# Patient Record
Sex: Male | Born: 1945 | Race: White | Hispanic: No
Health system: Midwestern US, Community
[De-identification: ages and names within clinical notes are randomized; demographics above are authoritative.]

## PROBLEM LIST (undated history)

## (undated) DIAGNOSIS — M545 Low back pain, unspecified: Secondary | ICD-10-CM

## (undated) DIAGNOSIS — I4819 Other persistent atrial fibrillation: Secondary | ICD-10-CM

## (undated) DIAGNOSIS — K219 Gastro-esophageal reflux disease without esophagitis: Secondary | ICD-10-CM

## (undated) DIAGNOSIS — M1990 Unspecified osteoarthritis, unspecified site: Secondary | ICD-10-CM

## (undated) DIAGNOSIS — G4733 Obstructive sleep apnea (adult) (pediatric): Secondary | ICD-10-CM

## (undated) DIAGNOSIS — I719 Aortic aneurysm of unspecified site, without rupture: Secondary | ICD-10-CM

## (undated) DIAGNOSIS — IMO0001 Reserved for inherently not codable concepts without codable children: Secondary | ICD-10-CM

## (undated) DIAGNOSIS — Z9981 Dependence on supplemental oxygen: Secondary | ICD-10-CM

## (undated) DIAGNOSIS — I714 Abdominal aortic aneurysm, without rupture, unspecified: Secondary | ICD-10-CM

## (undated) DIAGNOSIS — I712 Thoracic aortic aneurysm, without rupture, unspecified: Secondary | ICD-10-CM

## (undated) DIAGNOSIS — Z9989 Dependence on other enabling machines and devices: Secondary | ICD-10-CM

## (undated) DIAGNOSIS — K56609 Unspecified intestinal obstruction, unspecified as to partial versus complete obstruction: Secondary | ICD-10-CM

## (undated) DIAGNOSIS — K5792 Diverticulitis of intestine, part unspecified, without perforation or abscess without bleeding: Secondary | ICD-10-CM

## (undated) DIAGNOSIS — E785 Hyperlipidemia, unspecified: Secondary | ICD-10-CM

## (undated) DIAGNOSIS — H9190 Unspecified hearing loss, unspecified ear: Secondary | ICD-10-CM

## (undated) DIAGNOSIS — G8929 Other chronic pain: Secondary | ICD-10-CM

## (undated) DIAGNOSIS — I10 Essential (primary) hypertension: Secondary | ICD-10-CM

## (undated) DIAGNOSIS — I4891 Unspecified atrial fibrillation: Secondary | ICD-10-CM

## (undated) HISTORY — PX: COLON SURGERY: SHX602

## (undated) HISTORY — DX: Thoracic aortic aneurysm, without rupture: I71.2

## (undated) HISTORY — DX: Abdominal aortic aneurysm, without rupture, unspecified: I71.40

## (undated) HISTORY — DX: Other persistent atrial fibrillation: I48.19

## (undated) HISTORY — DX: Hyperlipidemia, unspecified: E78.5

## (undated) HISTORY — DX: Thoracic aortic aneurysm, without rupture, unspecified: I71.20

## (undated) HISTORY — DX: Essential (primary) hypertension: I10

## (undated) HISTORY — DX: Abdominal aortic aneurysm, without rupture: I71.4

## (undated) HISTORY — DX: Unspecified atrial fibrillation: I48.91

## (undated) HISTORY — PX: ABLATION OF DYSRHYTHMIC FOCUS: SHX254

## (undated) NOTE — Telephone Encounter (Signed)
 Formatting of this note might be different from the original.  Patient called and left a message that the rapaflo has helped and would like a prescription sent to his pharmacy  Electronically signed by Artis Dorothyann HERO at 07/05/2015  1:44 PM EDT

---
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
## 1976-11-16 HISTORY — PX: INGUINAL HERNIA REPAIR: SUR1180

## 1977-11-16 HISTORY — PX: WISDOM TOOTH EXTRACTION: SHX21

## 1987-04-17 HISTORY — PX: NASAL SINUS SURGERY: SHX719

## 2013-04-11 LAB — HM COLONOSCOPY

## 2013-06-25 ENCOUNTER — Emergency Department: Payer: Self-pay | Admitting: Emergency Medicine

## 2013-06-25 LAB — CBC
HCT: 47.2 % (ref 40.0–52.0)
HGB: 16.4 g/dL (ref 13.0–18.0)
MCH: 31.5 pg (ref 26.0–34.0)
MCHC: 34.8 g/dL (ref 32.0–36.0)
Platelet: 160 10*3/uL (ref 150–440)
RBC: 5.22 10*6/uL (ref 4.40–5.90)

## 2013-06-25 LAB — COMPREHENSIVE METABOLIC PANEL
Albumin: 4 g/dL (ref 3.4–5.0)
Alkaline Phosphatase: 110 U/L (ref 50–136)
BUN: 10 mg/dL (ref 7–18)
Bilirubin,Total: 0.5 mg/dL (ref 0.2–1.0)
Chloride: 102 mmol/L (ref 98–107)
Glucose: 145 mg/dL — ABNORMAL HIGH (ref 65–99)
Osmolality: 279 (ref 275–301)
Potassium: 3.2 mmol/L — ABNORMAL LOW (ref 3.5–5.1)
SGOT(AST): 18 U/L (ref 15–37)
SGPT (ALT): 26 U/L (ref 12–78)
Sodium: 139 mmol/L (ref 136–145)

## 2013-06-25 LAB — TROPONIN I
Troponin-I: <0.02 ng/mL
Troponin-I: <0.02 ng/mL

## 2013-06-25 LAB — CK TOTAL AND CKMB (NOT AT ARMC): CK-MB: 1.2 ng/mL (ref 0.5–3.6)

## 2013-06-25 LAB — LIPASE, BLOOD: Lipase: 267 U/L (ref 73–393)

## 2013-09-19 NOTE — Procedures (Unsigned)
Patient Name: Mark Jacobs  Account Number: [id number]  Date of Birth:  [birth year]  Record Number:  [id number]  Date of Procedure: 09/19/2013  Referring Physician(s): Delos Haring    Endoscopist: Dalia Heading     PROCEDURE PERFORMED         Colonoscopy  INDICATIONS FOR EXAMINATION:    constipation, abnormal stool  Instruments:  CF-H180AL  Medications: Fentanyl 75 mcg, Versed 3 mg Visualization: Good  Tolerance:  Good Complications:  None Extent of Exam: Rectosigmoid junction  Limitations: None    Specimens:   Classes: ASA Class II and Mallampati Class   II  Estimated Blood Loss:    Procedure Technique: The nature of the procedure including potential   complications such as bleeding, perforation, infection, missed lesions, risk   of cancer in the future and adverse reaction to medications, and alternatives   was discussed with the patient who appeared to understand and signed consent   was obtained.  The patient was connected to the monitoring devices and placed   in the left lateral position. After adequate IV sedation, a digital rectal   exam was performed.  The colonoscope was introduced into the rectum but only   advanced to the rectosigmoid junction.  The colon was then decompressed and   the scope was removed.  The patient tolerated the procedure well and there   were no apparent complications.  The rectal exam was normal.  The quality of   the prep was inadequate.    FINDINGS:  Poor prep in the rectum and beyond    ENDOSCOPIC DIAGNOSIS:  Incomplete colonoscopy  Poor prep    RECOMMENDATIONS:  Reschedule colonoscopy with 2 day prep        Signature:_________________________________ Dalia Heading , M.D.     This procedure was electronically signed off on(Endoscopy): 09/19/2013   11:16:42 AM by Dalia Heading,  M.D.

## 2013-09-29 LAB — CREATININE, POC
Creatinine: 1.4 mg/dl — ABNORMAL HIGH (ref 0.6–1.3)
Performed by: 96341

## 2013-09-30 LAB — CBC WITH AUTOMATED DIFF
BASOPHILS: 0.5 % (ref 0–3)
EOSINOPHILS: 0.8 % (ref 0–5)
HCT: 43.5 % (ref 37.0–50.0)
HGB: 14.8 gm/dl (ref 12.4–17.2)
IMMATURE GRANULOCYTES: 0.5 % (ref 0.0–3.0)
LYMPHOCYTES: 13.4 % — ABNORMAL LOW (ref 28–48)
MCH: 30.8 pg (ref 23.0–34.6)
MCHC: 34 gm/dl (ref 30.0–36.0)
MCV: 90.4 fL (ref 80.0–98.0)
MONOCYTES: 6.8 % (ref 1–13)
MPV: 11.4 fL — ABNORMAL HIGH (ref 6.0–10.0)
NEUTROPHILS: 78 % — ABNORMAL HIGH (ref 34–64)
NRBC: 0 (ref 0–0)
PLATELET: 272 10*3/uL (ref 140–450)
RBC: 4.81 M/uL (ref 3.80–5.70)
RDW-SD: 41.9 (ref 35.1–43.9)
WBC: 6.5 10*3/uL (ref 4.0–11.0)

## 2013-09-30 LAB — METABOLIC PANEL, BASIC
BUN: 17 mg/dl (ref 7–25)
CO2: 29 mEq/L (ref 21–32)
Calcium: 9 mg/dl (ref 8.5–10.1)
Chloride: 104 mEq/L (ref 98–107)
Creatinine: 1.3 mg/dl (ref 0.6–1.3)
GFR est AA: >60
GFR est non-AA: 59
Glucose: 86 mg/dl (ref 74–106)
Potassium: 4 mEq/L (ref 3.5–5.1)
Sodium: 138 mEq/L (ref 136–145)

## 2013-09-30 LAB — PROTHROMBIN TIME + INR
INR: 1.1 (ref 0.0–1.1)
Prothrombin time: 13.9 seconds (ref 11.5–14.0)

## 2013-09-30 LAB — GLUCOSE, POC: Glucose (POC): 72 mg/dL (ref 65–105)

## 2013-09-30 NOTE — ED Provider Notes (Signed)
[facility]  EMERGENCY DEPARTMENT TREATMENT REPORT  NAME:  [name], [name]  SEX:   M  ADMIT: 09/30/2013  DOB:   1946-06-06  MR#    [id number]  ROOM:  5223  TIME DICTATED: 03 54 PM  ACCT#  [id number]        I hereby certify this patient for admission based upon medical necessity as   noted below:    DATE OF SERVICE:   09/30/2013    TIME OF SERVICE:   At approximately 0220 P.M.    CHIEF COMPLAINT:  Abnormal outpatient CT.    HISTORY OF PRESENT ILLNESS:  The patient is a 67 year old gentleman who has had abdominal pain for about 2   weeks now.  The patient had a period in which the pain got better and has been   on outpatient antibiotics for diverticulitis but states his pain got abruptly   worse and was CT'd as an outpatient by Dr. Dorothyann Gibbs.  Dr. Dorothyann Gibbs to call   earlier today and let us know that he has abnormal CT results.  It would   appear that he has an abscess formation.    REVIEW OF SYSTEMS:  CONSTITUTIONAL:  Positive for fever, chills and sweats at night.  EYES:   No visual symptoms.   ENT:  No sore throat, runny nose, or other URI symptoms.   ENDOCRINE:  No diabetic symptoms.   HEMATOLOGIC/LYMPHATIC:   No excessive bruising or lymph node swelling.   ALLERGIC/IMMUNOLOGIC:  No urticaria or allergy symptoms.   RESPIRATORY:  No cough, shortness of breath, or wheezing.   CARDIOVASCULAR:  No chest pain, chest pressure, or palpitations.   GASTROINTESTINAL:  Positive for abdominal pain and nausea without vomiting.    The patient has had abnormal stools for 2 months.  GENITOURINARY:  No dysuria, frequency, or urgency.   MUSCULOSKELETAL:  No joint pain or swelling.   INTEGUMENTARY:  No rashes.   NEUROLOGICAL:  No headaches, sensory or motor symptoms.   PSYCHIATRIC:  No suicidal or homicidal ideation.     PAST MEDICAL HISTORY:  Negative.    PAST SURGICAL HISTORY:  Is also negative.    SOCIAL HISTORY:  Negative.  The patient does not smoke, does not drink and does not consume   recreational drugs.     PSYCHIATRIC HISTORY:  Denied.    FAMILY HISTORY:  Negative.    PHYSICAL EXAMINATION:  VITAL SIGNS:  On my assessment, blood pressure 130/108, heart rate 74,   respiratory 16, temperature 98, pain is 8 out of 10 and located diffusely over   the abdomen, O2 saturation 100% on room air.  GENERAL:  The patient appears uncomfortable, but is otherwise well developed   and adequately nourished.  HEENT:  Eye exam:  Conjunctivae are clear, lids are normal.  Pupils are equal   and normally reactive.  ENT:  Hearing is intact.  External examination of ears   and nose unremarkable.  Surfaces of the pharynx and palate are pink and free   of lesion.  NECK:  Supple.  There is no JVD.  Trachea is midline.  RESPIRATORY:  Clear and equal breath sounds.  No respiratory distress,   tachypnea, or accessory muscle use.   CARDIOVASCULAR:   Heart regular, without murmurs, gallops, rubs, or thrills.    The patient has no peripheral edema.  Calves are soft and nontender.  CHEST:  Symmetrical without masses or tenderness.  GASTROINTESTINAL:  Abdomen is distended and soft, but is tender diffusely.  The patient has a significant amount of pain in the right lower quadrant.  No   hepato- or splenomegaly could not be appreciated.  No abdominal or inguinal   masses are noted.  GENITOURINARY:  Deferred.  MUSCULOSKELETAL:  Stance and gait are normal.  Clubbing and deformities not   noted.  Nailbeds are pink with adequate capillary refill.  SKIN:  Warm and dry without rashes.   NEUROLOGIC:  Alert, oriented.  Sensation intact, motor strength equal and   symmetric.  There is no facial asymmetry or dysarthria.   PSYCHIATRIC:  Oriented to time, place and person.  Mood and affect    appropriate.     INITIAL ASSESSMENT AND MANAGEMENT PLAN:  The patient is a 67 year old gentleman who is sent in by Dr. Dorothyann Gibbs for an   abnormal CT scan.  We will call interventional radiology to get him in line    for CT-guided drainage if possible and will discuss with surgery per Dr.   Dorothyann Gibbs' request.    DIAGNOSTIC STUDIES:  The patient's CBC is normal.  The patient's INR is 1.1.  Basic metabolic panel   is normal.  Two-view chest x-ray is negative.  EKG shows a normal sinus   rhythm with a rate of 69.  He has no evidence of ischemia or infarction on   this EKG.    COURSE IN THE EMERGENCY DEPARTMENT:  The patient given Zosyn 3.375 grams IV med infusion after discussion with Dr.   Derrell Lolling from colorectal surgery.  The patient will be hydrated with normal   saline at 100 mL an hour.  The patient care was discussed with Dr. Maryelizabeth Kaufmann from   [facility] whose group who is happy to place the patient in an   inpatient regular bed for continued hydration, IV antibiotics and pending   evaluation for possible CT-guided drainage and consultation by colorectal   surgery.  There is a chance that CT-guided drainage cannot be performed and   therefore colorectal surgery is on board as well.    CLINICAL IMPRESSION AND DIAGNOSES:  1.  Complicated diverticulitis.  2.  Intraabdominal abscess.    DISPOSITION AND PLAN:  Inpatient, regular bed, currently stable.      ___________________  Mark Handy Himmel-Rebekka Lobello DO  Dictated By: Marland Kitchen     My signature above authenticates this document and my orders, the final  diagnosis (es), discharge prescription (s), and instructions in the [facility]   Pulsecheck record.  Nursing notes have been reviewed by the physician/mid-level provider.    If you have any questions please contact [phone].    LB  D:09/30/2013 15:54:18  T: 09/30/2013 16:55:25  [id number]  Authenticated by Mark Guernsey, DO On 10/23/2013 12:55:04 PM

## 2013-10-01 LAB — CBC WITH AUTOMATED DIFF
BASOPHILS: 0.5 % (ref 0–3)
EOSINOPHILS: 1.8 % (ref 0–5)
HCT: 42.3 % (ref 37.0–50.0)
HGB: 14.2 gm/dl (ref 12.4–17.2)
IMMATURE GRANULOCYTES: 0.5 % (ref 0.0–3.0)
LYMPHOCYTES: 18.8 % — ABNORMAL LOW (ref 28–48)
MCH: 30.5 pg (ref 23.0–34.6)
MCHC: 33.6 gm/dl (ref 30.0–36.0)
MCV: 90.8 fL (ref 80.0–98.0)
MONOCYTES: 8.7 % (ref 1–13)
MPV: 11.6 fL — ABNORMAL HIGH (ref 6.0–10.0)
NEUTROPHILS: 69.7 % — ABNORMAL HIGH (ref 34–64)
NRBC: 0 (ref 0–0)
PLATELET: 275 10*3/uL (ref 140–450)
RBC: 4.66 M/uL (ref 3.80–5.70)
RDW-SD: 41.7 (ref 35.1–43.9)
WBC: 6 10*3/uL (ref 4.0–11.0)

## 2013-10-01 LAB — METABOLIC PANEL, COMPREHENSIVE
ALT (SGPT): 21 U/L (ref 12–78)
AST (SGOT): 12 U/L — ABNORMAL LOW (ref 15–37)
Albumin: 2.9 gm/dl — ABNORMAL LOW (ref 3.4–5.0)
Alk. phosphatase: 68 U/L (ref 50–136)
BUN: 13 mg/dl (ref 7–25)
Bilirubin, total: 0.2 mg/dl (ref 0.2–1.0)
CO2: 28 mEq/L (ref 21–32)
Calcium: 8.4 mg/dl — ABNORMAL LOW (ref 8.5–10.1)
Chloride: 105 mEq/L (ref 98–107)
Creatinine: 1.2 mg/dl (ref 0.6–1.3)
GFR est AA: >60
GFR est non-AA: >60
Glucose: 102 mg/dl (ref 74–106)
Potassium: 4.2 mEq/L (ref 3.5–5.1)
Protein, total: 6.7 gm/dl (ref 6.4–8.2)
Sodium: 141 mEq/L (ref 136–145)

## 2013-10-01 NOTE — H&P (Signed)
[facility]  History and Physical  NAME:  Mark Jacobs, Mark Jacobs  SEX:   M  ADMIT: 09/30/2013  DOB:Aug 24, 1946  MR#    [id number]  ROOM:  5223  ACCT#  [id number]    I hereby certify this patient for admission based upon medical necessity as   noted below:    <    TIME OF SERVICE:  1959    CHIEF COMPLAINT:  Abdominal pain and constipation and also CT finding of diverticulitis with a   large air fluid collection measuring 7.5 x 6.9 x 7.8 cm thought to be a   diverticular abscess.    HISTORY OF PRESENT ILLNESS:  The patient is a 67 year old man with a past medical history significant for   diverticulosis, hypertension and diverticulitis in 2012, who presents to   [facility] with issues with chronic constipation, some   mid right lower abdominal pain and a CT of the abdomen that revealed a 7.5 x   6.9 x 7.8 cm air fluid collection surrounded by inflammatory soft tissue   stranding.  Because of this, he was sent to the ER for further evaluation and   treatment.  The patient has been having problems with chronic constipation for   3 years which has been getting worse.  He said he can hardly eat.  He uses   MiraLax on a daily basis at least twice a day.  The patient notes that he had   an unsuccessful colonoscopy that was attempted back on 11/04 with Dr.   Dorothyann Gibbs.  He said he took his first 16 ounces of a laxative solution which   went down fine.  He took a second 16 ounce dose to help with the colon prep   and he almost got the whole thing down when he then vomited up the last 16   ounces.  He had an immense pain located around the lower mid abdomen.  He then   presented for his colonoscopy.  He said that it could not be done because of   poor prep.  At that point, Dr. Dorothyann Gibbs later ordered a CT scan of the abdomen   and pelvis which showed the lesion as noted above.  The patient notes that   currently he feels okay except for some mild lower abdominal pain, denies any    chest pain or shortness of breath.  No nausea, vomiting, constipation,   diarrhea.  No lightheadedness or dizziness.  No fever and chills.  He denies   any leg edema.  No lightheadedness or dizziness.  No numbness or tingling.  No   heat or cold intolerance.  No night sweats.      REVIEW OF SYSTEMS:   Otherwise, the rest of the review of systems is found to be negative based on   12-step review of systems.    ALLERGIES:  NO KNOWN DRUG ALLERGIES.    PAST MEDICAL HISTORY:  Diverticulosis, hypertension, diverticulitis in 2012.    SOCIAL HISTORY:  The patient is widowed.  He has 2 kids, ages 61 and 57, a girl and a boy   respectively.  He drinks alcohol rarely, occasional wine.  No illicit drug   use.  Occasional caffeine use.    CURRENT MEDICATIONS:  MiraLax 3/4 cap b.i.d.    FAMILY HISTORY:  Mother is still alive at age 25.  She has history of colon cancer.  Father   died at age 78 of lung cancer.  He was  a smoker.     IMMUNIZATIONS:  He had a pneumonia shot in 2009.    PAST SURGICAL HISTORY:  Hernia repair, sinus surgery and colonoscopy in 2010.    VITAL SIGNS:    Height is 6 foot 2, blood pressure is 149/87, O2 saturation 99% on room air,   pulse 78, respiratory rate is 19, temperature 97.9 degrees Fahrenheit.  Weight   212 pounds.    PHYSICAL EXAMINATION:   GENERAL APPEARANCE:  The patient is alert and oriented times 3, no apparent   distress, very pleasant.  He is cooperative.  HEENT:  Sclerae is anicteric, conjunctivae pink.  Throat is clear.  Mucosa is   moist.  NECK:  No lymphadenopathy, no JVD, no bruits.  CHEST:  Clear to auscultation bilaterally.  No wheezes, no rales or rhonchi.  CARDIOVASCULAR:  Regular rate and rhythm.  S1, S2 is normal, no S3, S4 or   murmurs.  ABDOMEN:  Has some mild tenderness in the mid lower right abdomen upon deep   palpation.  No rebound or guarding.  No masses were noted.  No tenderness.  I   did not notice any tympany.  EXTREMITIES:  Had no edema, no clubbing or cyanosis.   NEUROLOGIC:  No focal neurologic deficits were noted.  Cranial nerves II   through XII were intact.    LABORATORY DATA:  BUN 17, creatinine 1.3, INR 1.1.  White count 6.5, hemoglobin 14.8, platelet   count 272, 78% neutrophils, 13% lymphocytes.  CT scan of the abdomen and   pelvis with contrast shows a large mid abdominal air fluid collection with   surrounding inflammatory soft tissue stranding measuring 75 x 69 x 78 mm with   interstitial air located close to sigmoid descending colon junction with   colonic diverticula most likely representing contained perforation of acute   diverticulitis with abscess collection and interstitial air of anaerobic air   producing organisms.  Scattered mesenteric reactive stranding was seen in the   pelvis and around this collection.  There is also a reactive small bowel ileus   some are adherent to this abscess.  Otherwise, the rest of the abdominal   organs and structures were normal and not remarkable.  A chest x-ray portable   was done that shows no acute disease.  EKG was also done that revealed normal   sinus rhythm with a rate of 69, no ischemic changes.    ASSESSMENT AND PLAN:  1.  Acute diverticulitis with abscess formation.  The patient is on Zosyn IV.    We will plan for interventional radiology to place a drain.  We will also   have Dr. Derrell Lolling of surgery on consult in case interventional radiology drain   is not able to be placed due to position of the abscess.  2.  Hypertension, off medications at this time.  Blood pressure is well   controlled.  3.  Chronic constipation, probably related to diverticulitis and abscess.  4.  Deep venous thrombosis prophylaxis.  Sequential compression devices.  I   will also have the patient ambulate.    Total time for admission was 50 minutes.      ___________________  Alvy Bimler MD  Dictated By: .   LH  D:10/01/2013 11:03:32  T: 10/01/2013 11:39:27  [id number]  Authenticated by Alvy Bimler, M.D. On 10/23/2013 05:53:24 PM

## 2013-10-01 NOTE — Consults (Signed)
[facility]  CONSULTATION REPORT  NAME:  [place], [name]  SEX:   M  ADMIT: 09/30/2013  DATE OF CONSULT: 10/01/2013  REFERRING PHYSICIAN:    DOB: 1946/04/08  MR#    [id number]  ROOM:  5223  ACCT#  [id number]    cc: [name] M.D., Dalia Heading MD    REASON FOR CONSULTATION:  Perforated diverticulitis with abscess.    REFERRING PHYSICIAN:  ER.    HISTORY OF PRESENT ILLNESS:  This is a very pleasant 67 year old male who is essentially otherwise healthy.   He presented to the emergency room with a several day history of worsening   constipation and mid abdominal pain.  Of note, he had an attempted colonoscopy   on 11/04 by Dr. Dorothyann Gibbs  and this was unsuccessful due to a very poor prep.    He ultimately ordered a CT scan of the abdomen and pelvis which showed a 7.5   x 6.9 x 7.8 cm air fluid collection adjacent to the sigmoid colon, descending   colon junction consistent with diverticulitis and abscess.  He was sent to the   ER for further evaluation and treatment ultimately admitted to the surgical   floor where I have seen him.  He states his constipation, had worsened despite   using MiraLax.  He did report worsening mid and lower abdominal pain.  Denies   fevers but was having sweats at home.  Denies nausea, vomiting.  Denied   rectal bleeding.  Reports he had 1 previous episode of diverticulitis.  I have   been asked to further evaluate and assist in his treatment plan.  Of note, he   denies any urinary symptoms.    PAST MEDICAL HISTORY:  Essentially negative.  He denies any history of hypertension, diabetes or   coronary artery disease.    PAST SURGICAL HISTORY:  Only significant for left inguinal hernia.    ALLERGIES:  NO KNOWN DRUG ALLERGIES.    FAMILY HISTORY:  Significant for a mother who has a history of colon cancer.    SOCIAL HISTORY:  Denies smoking use.  Rarely drinks alcohol.    REVIEW OF SYSTEMS:  His 10-point review of systems otherwise negative except as noted above.     PHYSICAL EXAMINATION:  GENERAL:  This is a pleasant, well-developed, well-nourished male in no acute   distress.  VITAL SIGNS:  Temperature is 97.7, pulse 61, respirations 18, blood pressure   158/91.  HEENT:  Shows head to be normocephalic, atraumatic.  Sclerae are anicteric.    Oropharynx is clear.  NECK:  Supple, without masses or adenopathy.  LUNGS:  Clear to auscultation bilaterally.  CARDIOVASCULAR:  Regular rate and rhythm.  ABDOMEN:  Soft, nondistended.  Minimal tenderness around a lower mid abdomen   drain.  There is no rebound or guarding.  EXTREMITIES:  No evidence of edema.  NEUROLOGIC:  Grossly intact.    LABORATORY STUDIES:  White count of 6, no of 14.4 and 42.3, platelet count is 275.  Electrolytes   are within normal limits.  LFTs are within normal limits.  Albumin is 2.9.  CT   scan which has been reviewed by myself shows the findings as noted in HPI.    IMPRESSION:  Complicated sigmoid diverticulitis with abscess, status post percutaneous   drainage.    PLAN:  Continue intravenous antibiotics.  Okay to start clear liquids.  Will follow   drain output.  Ultimately, he will need a repeat CT scan in approximately  7   days and also a fistulogram through the drain.  Once this has resolved I   strongly recommend that he proceed with a laparoscopic sigmoid colectomy.    Thank you very much for allowing me to see this very pleasant gentleman in   consultation.  We will follow along with you.      ___________________  Beryle Flock MD  Dictated By:.   FS  D:10/01/2013 12:04:58  T: 10/01/2013 12:32:14  [id number]  Authenticated by Beryle Flock, MD On 10/03/2013 09:29:04 AM

## 2013-10-02 LAB — CBC WITH AUTOMATED DIFF
BASOPHILS: 0.3 % (ref 0–3)
EOSINOPHILS: 0.9 % (ref 0–5)
HCT: 40.4 % (ref 37.0–50.0)
HGB: 13.8 gm/dl (ref 12.4–17.2)
IMMATURE GRANULOCYTES: 0.4 % (ref 0.0–3.0)
LYMPHOCYTES: 15 % — ABNORMAL LOW (ref 28–48)
MCH: 30.3 pg (ref 23.0–34.6)
MCHC: 34.2 gm/dl (ref 30.0–36.0)
MCV: 88.8 fL (ref 80.0–98.0)
MONOCYTES: 6.6 % (ref 1–13)
MPV: 11.4 fL — ABNORMAL HIGH (ref 6.0–10.0)
NEUTROPHILS: 76.8 % — ABNORMAL HIGH (ref 34–64)
NRBC: 0 (ref 0–0)
PLATELET: 296 10*3/uL (ref 140–450)
RBC: 4.55 M/uL (ref 3.80–5.70)
RDW-SD: 40.4 (ref 35.1–43.9)
WBC: 6.7 10*3/uL (ref 4.0–11.0)

## 2013-10-02 LAB — RENAL FUNCTION PANEL
Albumin: 2.9 gm/dl — ABNORMAL LOW (ref 3.4–5.0)
BUN: 8 mg/dl (ref 7–25)
CO2: 27 mEq/L (ref 21–32)
Calcium: 8.5 mg/dl (ref 8.5–10.1)
Chloride: 104 mEq/L (ref 98–107)
Creatinine: 1.1 mg/dl (ref 0.6–1.3)
GFR est AA: >60
GFR est non-AA: >60
Glucose: 120 mg/dl — ABNORMAL HIGH (ref 74–106)
Phosphorus: 3 mg/dl (ref 2.5–4.9)
Potassium: 3.4 mEq/L — ABNORMAL LOW (ref 3.5–5.1)
Sodium: 140 mEq/L (ref 136–145)

## 2013-10-03 LAB — METABOLIC PANEL, BASIC
BUN: 10 mg/dl (ref 7–25)
CO2: 26 mEq/L (ref 21–32)
Calcium: 8.5 mg/dl (ref 8.5–10.1)
Chloride: 105 mEq/L (ref 98–107)
Creatinine: 1.3 mg/dl (ref 0.6–1.3)
GFR est AA: >60
GFR est non-AA: 59
Glucose: 86 mg/dl (ref 74–106)
Potassium: 3.7 mEq/L (ref 3.5–5.1)
Sodium: 139 mEq/L (ref 136–145)

## 2013-10-03 LAB — CBC WITH AUTOMATED DIFF
BASOPHILS: 0.6 % (ref 0–3)
EOSINOPHILS: 1.1 % (ref 0–5)
HCT: 41.1 % (ref 37.0–50.0)
HGB: 14 gm/dl (ref 12.4–17.2)
IMMATURE GRANULOCYTES: 0.4 % (ref 0.0–3.0)
LYMPHOCYTES: 21 % — ABNORMAL LOW (ref 28–48)
MCH: 30.5 pg (ref 23.0–34.6)
MCHC: 34.1 gm/dl (ref 30.0–36.0)
MCV: 89.5 fL (ref 80.0–98.0)
MONOCYTES: 7.3 % (ref 1–13)
MPV: 11.1 fL — ABNORMAL HIGH (ref 6.0–10.0)
NEUTROPHILS: 69.6 % — ABNORMAL HIGH (ref 34–64)
NRBC: 0 (ref 0–0)
PLATELET: 302 10*3/uL (ref 140–450)
RBC: 4.59 M/uL (ref 3.80–5.70)
RDW-SD: 41.6 (ref 35.1–43.9)
WBC: 7 10*3/uL (ref 4.0–11.0)

## 2013-10-10 LAB — CREATININE, POC
Creatinine: 1.5 mg/dl — ABNORMAL HIGH (ref 0.6–1.3)
Performed by: 21212

## 2013-10-25 LAB — COLOGUARD: Cologuard: POSITIVE

## 2013-11-10 HISTORY — PX: COLECTOMY: SHX59

## 2013-11-16 NOTE — ED Provider Notes (Signed)
[facility] : [facility]  EMERGENCY DEPARTMENT TREATMENT REPORT  NAME:  [place], [name]  SEX:   M  ADMIT: 11/16/2013  DOB:   Oct 12, 1946  MR#    [id number]  ROOM:    TIME DICTATED: 02 00 PM  ACCT#  [id number]        TIME OF EVALUATION:   1359.    CHIEF COMPLAINT:  Cough.    HISTORY OF PRESENT ILLNESS:  This is a 68 year old male who states he has had a flu since 11/12/2013,   coughing, congested, low-grade fever.  He states he cough became chunky and   sometimes is pain obtained which is what concerned him.  He states he had to   reschedule surgery for an abscess in his colon and he was concerned when the   cough changed.  He wanted to make sure he was okay as he had to reschedule for   surgery that had to reschedule his surgery.  He has not traveled anywhere   recently.  He did have an influenza vaccine this season.    REVIEW OF SYSTEMS:  CONSTITUTIONAL:  Positive for low-grade fever and chills.  HEENT:  ENT:  No sore throat, runny nose, or other URI symptoms.   RESPIRATORY:  Positive for cough productive of some purulent sputum, sometimes   pink tinged.  No frank hemoptysis.  CARDIOVASCULAR:  No chest pain, chest pressure, or palpitations.   GASTROINTESTINAL:  Abdomen soft, nontender, without complaint of pain to   palpation.  No hepatomegaly or splenomegaly.   MUSCULOSKELETAL:  Denies any leg pain or swelling.  INTEGUMENTARY:  No rash.  NEUROLOGICAL:  No headaches, sensory or motor symptoms.     Denies complaints in all other systems.     PAST MEDICAL HISTORY:  He has a diverticular abscess.    MEDICATIONS:  None.    ALLERGIES:  NONE.    SOCIAL HISTORY:  Negative for tobacco or alcohol.  No history of travel.  He is here by   himself.    PHYSICAL EXAMINATION:  GENERAL:  A well-developed male, nontoxic in appearance.  VITAL SIGNS:  Blood pressure was 150/90, pulse 74, respirations 20,   temperature 98.6, O2 sats 98% on room air.  HEENT:  Conjunctivae are clear.  Ears:  He has hearing aids in both ears.     Mouth:  Mucous membranes pink, throat is clear.  NECK:  Supple, nontender, symmetrical, no masses or JVD, trachea midline,   thyroid not enlarged, nodular, or tender.   LUNGS:  Clear to auscultation without any wheezing or rhonchi.  He is not   tachypneic or dyspneic with conversation.  HEART:  Has a regular rate and rhythm without murmur.  ABDOMEN:  Soft and nontender.  EXTREMITIES:  Symmetrical, warm and dry.  Calves soft, nontender.  SKIN:  Without rash.  NEUROLOGICAL:  He is awake, alert and oriented.     CONTINUED BY JULIA C. HUBBARD, PA:      IMPRESSION AND MANAGEMENT PLAN:     This is a 68 year old male with cough, congestion.  At this time a chest x-ray   will be obtained to make sure he does not have pneumonia.    DIAGNOSTIC STUDIES:  Chest x-ray showed no acute disease in the chest per radiology.    COURSE IN THE EMERGENCY DEPARTMENT:  Findings were discussed.  I discussed this is probably still part of a viral   picture and the influenza-like illness he had.  He is concerned about his  upcoming surgery.  At this time we will write for a Z-Pak, have him hold off   for a few days to see if the symptoms abate.  I will write for some Tussionex   to help with his cough, rest, push fluids.  Have him follow up with his doctor   next week if not improving and to certainly seek medical attention anytime   worsening or new concerns.    FINAL DIAGNOSIS:  Acute cough, recent influenza-like illness.    DISPOSITION AND PLAN:    He was discharged home in stable condition to follow up as above.  The patient   was examined by myself and Dr. Sudie BaileyJohn Ajee Heasley who agrees with the above   assessment and plan.     CONTINUATION BY Candis ShineJONATHAN Troi Bechtold, MD     HISTORY OF PRESENT ILLNESS:  The patient is a 68 year old male here concerned that he has pneumonia.  Chest   x-ray is unremarkable.  He appears very well.      DECISIONMAKING:  As per Mikal PlaneJulia Hubbard, PA dictation.      ___________________  Wynelle BourgeoisJonathan A Jaylnn Ullery MD   Dictated By: Maurice SmallJulia C. Williams CheHubbard, [place]PA    My signature above authenticates this document and my orders, the final  diagnosis (es), discharge prescription (s), and instructions in the [facility]   Pulsecheck record.  Nursing notes have been reviewed by the physician/mid-level provider.    If you have any questions please contact [phone].    rm  D:11/16/2013 14:00:49  T: 11/16/2013 15:14:10  [id number]  Authenticated by Wynelle BourgeoisJONATHAN A Daxtin Leiker, MD On 11/22/2013 02:41:01 PM

## 2013-12-08 NOTE — Procedures (Unsigned)
Patient Name: Mark Jacobs  Account Number: [id number]  Date of Birth:  01/19/1946  Record Number:  [id number]  Date of Procedure: 12/08/2013  Referring Physician(s): Ray T Lenn Sinkamirez  Brandon M Peters    Endoscopist: Dalia HeadingPaul Ricketts     PROCEDURE PERFORMED         Colonoscopy  INDICATIONS FOR EXAMINATION:    change in bowel habits, follow up for   complicated diverticulitis  Instruments:  [id number]-180AL  Medications: Propofol per anesthesia department Visualization: Good  Tolerance:  Good Complications:  None Extent of Exam: Cecum  Limitations: None    Specimens:   Classes: ASA Class III and Mallampati Class   II  Estimated Blood Loss:    Procedure Technique: The nature of the procedure including potential   complications such as bleeding, perforation, infection, missed lesions, risk   of cancer in the future and adverse reaction to medications, and alternatives   was discussed with the patient who appeared to understand and signed consent   was obtained.  The patient was connected to the monitoring devices and placed   in the left lateral position. After adequate IV sedation, a digital rectal   exam was performed.  The colonoscope was introduced into the rectum and   advanced without difficulty through the colon to the Cecum. The cecum was   identified by typical landmarks including ileocecal valve and appendiceal   orifice.  A careful examination was performed as the scope was withdrawn. In   the rectum, the scope was retroflexed.  The colon was then decompressed and   the scope was removed.  The patient tolerated the procedure well and there   were no apparent complications.  The rectal exam was normal.  The quality of   the prep was good.    FINDINGS:  The scope was advanced the the cecum with some difficulty, primarily at the   end trying to intubate the cecum.  Cecal intubation was achieved with manual   external pressure and position change (prone).   Erythematous thickened ascending colon fold vs. flat polyp seen.  Biopsies   were taken to rule out adenomatous tissue.  Site was tattooed with [place] ink   1cc.  There was extensive sigmoid diverticulosis.  One diverticula had erythema and   some pus, consistent with diverticulitis.  This was seen only on scope   withdrawal.   The rest of the colonoscopy was unremarkable.  Internal hemorrhoids were seen on retroflexion in the rectum.    ENDOSCOPIC DIAGNOSIS:  Thickened ascending colon fold vs. polyp, biopsied and tattooed  Sigmoid diverticulitis, mild and likely chronic  Internal hemorrhoids    RECOMMENDATIONS:  Await pathology results.  Call office in 3 weeks if you have not been contacted with biopsy results.  Ciprofloxacin and flagyl x 7 days  Surgery (sigmoidectomy) per Dr. Derrell Lollingamirez next week  Clear liquid diet for now  Repeat colonoscopy in 6 months if biopsies are adenomatous        Signature:_________________________________ Dalia HeadingPaul Ricketts , M.D.     This procedure was electronically signed off on(Endoscopy): 12/08/2013 9:32:40   AM by Dalia HeadingPaul Ricketts,  M.D.

## 2013-12-11 NOTE — Op Note (Signed)
[facility]  [facility] Operation [facility]  NAME:  Mark GilbertJAMES, [name]  SEX:   M  DATE: 12/11/2013  DOB: 1946/06/20  MR#    [id number]  ROOM:  OR30  ACCT#  [id number]    cc: Mady HaagensenBRANDON PETERS M.D., Dalia HeadingPaul Ricketts MD, [name] Shaynna Husby MD    PREOPERATIVE DIAGNOSIS:  Recurrent diverticulitis was history of abscess.    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    PROCEDURE:  Laparoscopic low anterior resection, complete mobilization of splenic flexure,   omental pedicle flap to the pelvis, insertion of On-Q pain pump.    SURGEON:  Lennon Richins T.  Derrell Lollingamirez, MD    ASSISTANTS:  Dr. Jeral PinchBrian Buchberg and Ramiro HarvestEmily Duque, surgical assistant.    ANESTHESIA:  General endotracheal.    BLOOD LOSS:  100 mL    FLUIDS:  1500 mL crystalloid, 500 mL of 5% albumin.  Blood loss 100 mL.    URINE OUTPUT:  375 mL    COMPLICATIONS:  None.    FINDINGS:  Acute and chronic inflammation of the sigmoid colon and upper rectum with   adhesions to the small bowel and pelvis.    SPECIMENS:    Sigmoid colon and upper rectum.    INDICATIONS FOR PROCEDURE:  This is a [age] year old male who has previously had episodes of sigmoid   diverticulitis with abscess.  He did require percutaneous drainage.  He   underwent a colonoscopy on Friday and still showed some  mild acute   inflammation.  He is brought to the operating room today for elective   resection.    DETAILS OF PROCEDURE:  The patient was identified, brought to the operating was placed in supine   position on operating table.  Sequential compression devices placed on lower   extremities.  He was induced under endotracheal anesthesia.  Orogastric tube   and Foley catheter were placed subsequently placed in dorsal lithotomy   position.  Abdomen, perineum were prepped and draped in the usual sterile   fashion.  Area around the proposed incision was infiltrated with 0.5% Marcaine   with epinephrine.  A small 6 cm incision was made in the midline just above    the umbilicus.  This was carried down through subcutaneous tissues, fascia   divided in the midline.  Peritoneal cavity entered without difficulty.  The   Dextrus Hand access device placed in the wound.  Subsequently, a 5 mm trocar   was placed  in midline suprapubic position 10/12 trocar placed in the right   lower quadrant.  Pneumoperitoneum was established.  The visualized portion   upper abdomen appeared to be normal.  The patient had acute/chronic inflamed   sigmoid colon with adherence into the pelvis.  There were also multiple   adhesions to the right side of the sigmoid and sigmoid mesentery to the small   bowel.  Careful sharp dissection with the laparoscopic dissecting scissors   used to free up the terminal ileum from the inflamed process without   difficulty.  After completely freeing up the small bowel from the mesentery   and sigmoid colon, the right side of the sigmoid mesentery was scored.  The   avascular retroperitoneal space was entered. The left ureter was identified   and care was taken to avoid injury to the structure.  The inferior mesenteric   vein was isolated adjacent to the ligament of Treitz and divided with   laparoscopic vascular stapler.  In a similar fashion, the  inferior mesenteric   artery was isolated and divided then just proximal to the takeoff of the left   colic artery.  The sigmoid colon was carefully mobilized by sharp dissection.    There were some acute adhesions to the left pelvic sidewall.  The descending   colon was also mobilized to the midline.  The greater omentum was then taken   off the transverse colon, lesser sac entered and the splenic flexure   completely mobilized by sharp dissection.  Attention was then placed to the   pelvis.  The distal sigmoid upper rectum were adherent to the anterior pelvis   and right pelvic sidewall.  This was carefully taken down by sharp dissection.    Subsequently, the mid-rectum was noted to be soft.  The upper rectum was    chronically scarred. The mid-rectum was cleaned off nicely and divided with   laparoscopic GI stapler.  The avascular retrorectal space was entered.  The   mesorectum was then divided distally with the laparoscopic vascular stapler.    Pneumoperitoneum was carefully evacuated through trocar.  Upper rectum and   sigmoid were exteriorized through the Dextrus disk.  The remaining sigmoid   mesentery was serially clamped, divided and ligated with 2-0 Vicryl ties and   divided with the EnSeal device.  A colotomy was made in the proximal sigmoid   colon.  The anvil to the EEA 29 stapler with a SeamGuard was placed into the   descending colon.  Descending colon, sigmoid colon junction stapled close and   divided with a GIA stapler.  Specimen was passed off the field.  The anvil was   brought through the staple line on the antimesenteric side.  The descending   colon was placed back into the abdominal cavity.  Pneumoperitoneum was   reinsufflated.  Dr. Su Hoff went to the peritoneum and placed the EEA stapler   into the rectum and advanced to the rectal stump without difficulty. A   stapled end-to-end anastomosis was then performed from the descending colon to   the mid-rectum.  There was excellent length for tension free anastomosis.    Blood supply was good.  Anastomosis tested by placing a proctoscope in the   rectum, insufflating with air after placing warm sterile water in the pelvis,   there was no evidence of anastomotic leak.  Pelvis and left gutter were   copiously irrigated with sterile water.  Excellent hemostasis was obtained.    An omental pedicle flap was then fashioned.  The majority of the greater   omentum was taken off the distal transverse colon and the flap based on the   left gastroepiploic vessels was fashioned brought down the left gutter and   placed into the pelvis covering the anastomosis nicely.  The small bowel was   run from ligament Treitz proximally and noted to be completely intact.     Pneumoperitoneum was carefully evacuated through trocar.  On-Q pain catheters   were placed.  Needle and peel-away sheath placed several centimeters above and   lateral to the small midline incision.  Tunneling device was used to tunnel   the peel-away sheath into the preperitoneal space.  These were fed inferiorly   and laterally.  The fascia of the small midline incision was closed with   running #1 PDS.  Of note, all sponge, needle and instrument counts were   correct times 2 prior to closure.  Wound was pulse lavaged a total of 1 liter  bacitracin irrigation.  Skin incisions closed with running subcuticular 4-0   Monocryl.  Dermabond applied.  On-Q catheters was placed through the peel-away   sheaths, each loaded with 5 mL 0.5% Marcaine without epinephrine.  The   patient tolerated the procedure without any complications, transferred to the   recovery room extubated in stable condition.      ___________________  Beryle Flock MD  Dictated By:.   [facility]  D:12/11/2013 11:25:39  T: 12/11/2013 11:55:15  [id number]  Authenticated by Beryle Flock, MD On 12/11/2013 12:56:33 PM

## 2013-12-11 NOTE — Op Note (Signed)
[facility]  [facility]  NAME:  Severiano GilbertJAMES, Matyas  SEX:   M  DATE: 12/11/2013  DOB: [birth year]  MR#    [id number]  ROOM:  OR30  ACCT#  [id number]    cc: Mady HaagensenBRANDON PETERS M.D., Dalia HeadingPaul Ricketts MD, [name] Tigerlily Christine MD    PREOPERATIVE DIAGNOSIS:  Recurrent diverticulitis was history of abscess.    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    PROCEDURE:  Laparoscopic low anterior resection, complete mobilization of splenic flexure,   omental pedicle flap to the pelvis, insertion of On-Q pain pump.    SURGEON:  Maurissa Ambrose T.  Derrell Lollingamirez, MD    ASSISTANTS:  Dr. Jeral PinchBrian Buchberg and Ramiro HarvestEmily Duque, surgical assistant.    ANESTHESIA:  General endotracheal.    BLOOD LOSS:  100 mL    FLUIDS:  1500 mL crystalloid, 500 mL of 5% albumin.  Blood loss 100 mL.    URINE OUTPUT:  375 mL    COMPLICATIONS:  None.    FINDINGS:  Acute and chronic inflammation of the sigmoid colon and upper rectum with   adhesions to the small bowel and pelvis.    SPECIMENS:    Sigmoid colon and upper rectum.    INDICATIONS FOR PROCEDURE:  This is a 68 year old male who has previously had episodes of sigmoid   diverticulitis with abscess.  He did require percutaneous drainage.  He   underwent a colonoscopy on Friday and still showed some  mild acute   inflammation.  He is brought to the operating room today for elective   resection.    DETAILS OF PROCEDURE:  The patient was identified, brought to the operating was placed in supine   position on operating table.  Sequential compression devices placed on lower   extremities.  He was induced under endotracheal anesthesia.  Orogastric tube   and Foley catheter were placed subsequently placed in dorsal lithotomy   position.  Abdomen, perineum were prepped and draped in the usual sterile   fashion.  Area around the proposed incision was infiltrated with 0.5% Marcaine   with epinephrine.  A small 6 cm incision was made in the midline just above   the  umbilicus.  This was carried down through subcutaneous tissues, fascia   divided in the midline.  Peritoneal cavity entered without difficulty.  The   Dextrus Hand access device placed in the wound.  Subsequently, a 5 mm trocar   was placed  in midline suprapubic position 10/12 trocar placed in the right   lower quadrant.  Pneumoperitoneum was established.  The visualized portion   upper abdomen appeared to be normal.  The patient had acute/chronic inflamed   sigmoid colon with adherence into the pelvis.  There were also multiple   adhesions to the right side of the sigmoid and sigmoid mesentery to the small   bowel.  Careful sharp dissection with the laparoscopic dissecting scissors   used to free up the terminal ileum from the inflamed process without   difficulty.  After completely freeing up the small bowel from the mesentery   and sigmoid colon, the right side of the sigmoid mesentery was scored.  The   avascular retroperitoneal space was entered. The left ureter was identified   and care was taken to avoid injury to the structure.  The inferior mesenteric   vein was isolated adjacent to the ligament of Treitz and divided with   laparoscopic vascular stapler.  In a similar fashion, the  inferior mesenteric   artery was isolated and divided then just proximal to the takeoff of the left   colic artery.  The sigmoid colon was carefully mobilized by sharp dissection.    There were some acute adhesions to the left pelvic sidewall.  The descending   colon was also mobilized to the midline.  The greater omentum was then taken   off the transverse colon, lesser sac entered and the splenic flexure   completely mobilized by sharp dissection.  Attention was then placed to the   pelvis.  The distal sigmoid upper rectum were adherent to the anterior pelvis   and right pelvic sidewall.  This was carefully taken down by sharp dissection.    Subsequently, the mid-rectum was noted to be soft.  The upper rectum was   chronically  scarred. The mid-rectum was cleaned off nicely and divided with   laparoscopic GI stapler.  The avascular retrorectal space was entered.  The   mesorectum was then divided distally with the laparoscopic vascular stapler.    Pneumoperitoneum was carefully evacuated through trocar.  Upper rectum and   sigmoid were exteriorized through the Dextrus disk.  The remaining sigmoid   mesentery was serially clamped, divided and ligated with 2-0 Vicryl ties and   divided with the EnSeal device.  A colotomy was made in the proximal sigmoid   colon.  The anvil to the EEA 29 stapler with a SeamGuard was placed into the   descending colon.  Descending colon, sigmoid colon junction stapled close and   divided with a GIA stapler.  Specimen was passed off the field.  The anvil was   brought through the staple line on the antimesenteric side.  The descending   colon was placed back into the abdominal cavity.  Pneumoperitoneum was   reinsufflated.  Dr. Su Hoff went to the peritoneum and placed the EEA stapler   into the rectum and advanced to the rectal stump without difficulty. A   stapled end-to-end anastomosis was then performed from the descending colon to   the mid-rectum.  There was excellent length for tension free anastomosis.    Blood supply was good.  Anastomosis tested by placing a proctoscope in the   rectum, insufflating with air after placing warm sterile water in the pelvis,   there was no evidence of anastomotic leak.  Pelvis and left gutter were   copiously irrigated with sterile water.  Excellent hemostasis was obtained.    An omental pedicle flap was then fashioned.  The majority of the greater   omentum was taken off the distal transverse colon and the flap based on the   left gastroepiploic vessels was fashioned brought down the left gutter and   placed into the pelvis covering the anastomosis nicely.  The small bowel was   run from ligament Treitz proximally and noted to be completely intact.    Pneumoperitoneum  was carefully evacuated through trocar.  On-Q pain catheters   were placed.  Needle and peel-away sheath placed several centimeters above and   lateral to the small midline incision.  Tunneling device was used to tunnel   the peel-away sheath into the preperitoneal space.  These were fed inferiorly   and laterally.  The fascia of the small midline incision was closed with   running #1 PDS.  Of note, all sponge, needle and instrument counts were   correct times 2 prior to closure.  Wound was pulse lavaged a total of 1 liter  bacitracin irrigation.  Skin incisions closed with running subcuticular 4-0   Monocryl.  Dermabond applied.  On-Q catheters was placed through the peel-away   sheaths, each loaded with 5 mL 0.5% Marcaine without epinephrine.  The   patient tolerated the procedure without any complications, transferred to the   recovery room extubated in stable condition.      ___________________  Beryle Flock MD  Dictated By:.   [facility]  D:12/11/2013 11:25:39  T: 12/11/2013 11:55:15  [id number]  Authenticated by Beryle Flock, MD On 12/11/2013 12:56:33 PM

## 2013-12-13 NOTE — Discharge Summary (Signed)
[facility]  [facility]   NAME:  Mark FearingJAMES, [place]  SEX:   M  ADMIT: 12/11/2013  DISCH: 12/08/2013  DOB:   26-Feb-1946  MR#    [id number]  ACCT#  [id number]    cc: Mady HaagensenBRANDON PETERS M.D., Dalia HeadingPaul Ricketts MD, [name] Yarden Hillis MD    DATE OF ADMISSION:  12/11/2013.    DATE OF DISCHARGE:  12/13/2013.    ADMISSION DIAGNOSIS:  Recurrent/chronic diverticulitis with history of abscess.    DISCHARGE DIAGNOSIS:  Status post laparoscopic low anterior resection, complete mobilization of   splenic flexure, omental pedicle flap to the pelvis, insertion of On-Q pain   pump for recurrent/chronic diverticulitis with history of abscess.    ATTENDING:  [name] Amandajo Gonder, M.D.    BRIEF HISTORY:  For detailed history see the chart.  This 68 year old male who has a history   of recurrent diverticulitis.  He has previously had percutaneous drainage of   pelvic abscess.  He was admitted for elective resection.    HOSPITAL COURSE:  The patient was admitted on 12/11/2013 underwent the above noted procedure   without complication.  His hospital course was essentially unremarkable.  He   was transferred to the surgical floor postoperatively. He was started on clear   liquid diet and mobilized on day #1.  On postoperative day #2,  he was   advanced to regular diet. Foley removed IV discontinued. He was placed on p.o.   meds.  On postoperative day #2, he was doing well, tolerating a regular diet,   had return of bowel function with both flatus and bowel movement.  He was   afebrile, vital signs stable. Abdominal exam was unremarkable.  Incision was   healing nicely.  He was felt to be stable for discharge.    DISCHARGE INSTRUCTIONS:  Discharged home on a regular diet.  He is to avoid lifting more than 20 pounds   or driving.  He is given prescription for Ultram for pain.  He should resume   his preadmission medications.  He should discontinue his Cipro and Flagyl.    Also, discontinue his MiraLax.  He should follow up in my office in 2 weeks.     He should call sooner if there are any problems.  I have instructed him on how   to remove his On-Q pain pump tomorrow afternoon.  As stated, he should call   sooner if there are problems.      ___________________  Beryle Flockay T Tareek Sabo MD  Dictated By: .   [facility]  D:12/13/2013 11:23:49  T: 12/13/2013 11:47:03  [id number]  Authenticated by Beryle FlockAY T. Yasmina Chico, MD On 12/13/2013 06:02:03 PM

## 2014-09-03 LAB — PSA, DIAGNOSTIC (PROSTATE SPECIFIC AG): Prostate Specific Ag: 1.4 ng/ml (ref 0.0–4.0)

## 2014-09-26 ENCOUNTER — Ambulatory Visit: Admit: 2014-09-26 | Discharge: 2014-09-26 | Payer: MEDICARE | Attending: Urology | Primary: Family Medicine

## 2014-09-26 DIAGNOSIS — N529 Male erectile dysfunction, unspecified: Secondary | ICD-10-CM | POA: Insufficient documentation

## 2014-09-26 LAB — AMB POC URINALYSIS DIP STICK AUTO W/O MICRO
Bilirubin (UA POC): NEGATIVE
Blood (UA POC): NEGATIVE
Glucose (UA POC): NEGATIVE
Ketones (UA POC): NEGATIVE
Leukocyte esterase (UA POC): NEGATIVE
Nitrites (UA POC): NEGATIVE
Protein (UA POC): NEGATIVE mg/dL
Specific gravity (UA POC): 1.02 (ref 1.001–1.035)
Urobilinogen (UA POC): 0.2 (ref 0.2–1)
pH (UA POC): 6 (ref 4.6–8.0)

## 2014-09-26 MED ORDER — TADALAFIL 5 MG TABLET
5 mg | ORAL_TABLET | Freq: Every day | ORAL | Status: DC | PRN
Start: 2014-09-26 — End: 2015-01-21

## 2014-09-26 NOTE — Progress Notes (Signed)
ASSESSMENT:     ICD-10-CM ICD-9-CM    1. Erectile dysfunction, unspecified erectile dysfunction type N52.9 607.84    2. Lower urinary tract symptoms (LUTS) R39.9 788.99    3. Groin pain, unspecified laterality R10.30 789.00 AMB POC URINALYSIS DIP STICK AUTO W/O MICRO   4. Peyronie's disease N48.6 607.85        PLAN:    1. Cialis 5 mg daily, Discussed Cialis daily dose 5 mg with possible side including headache,upset stomach, back pain and facial flushing.  The patient is not taking nitrates.  2. Loose weight; he agrees that this is something he needs to do for himself.  3. Groin pain mainly GI related, he will seek attention with specialist. Eventually we could consider PT for pelvic/groin pain, but [place]. Re discuss this at last visit.   4. Cut back caffeine.   5. Next visit Flow and PVR    DISCUSSION:   Erectile Dysfunction Counseling    The patient was referred from management of his erectile dysfunction by Gregery NaMICHELLE M PAULSEN, MD .  The patient has had prior attempts at management of his erectile dysfunction.  The patient in the past has tried Viagra and Cialis with good initial response, and more recently very poor.   The pathophysiology and physiology of erections were discussed using the model that illustrates the difference between the flaccid and erect penis, and then using a smooth-muscle cell model I explained how an erection occurs.  The cyclic GMP and the cyclic AMP mechanisms were discussed as well as the place for alpha-sympathetic tone.      I then discussed with the patient from the standpoint of ???potential targets??? how pharmacologic therapy works and how combination therapy can be more efficacious than monotherapy.  Additionally, the place for oral therapy as well as urethral suppositories was also discussed with the patient.      The patient was told that the approach here is truly to begin with the less invasive therapy which would be oral therapy, moving on to  intracorporal therapy if we could not get a successful result with oral therapy and if we could not get a success result with intracavernosal therapy then it might be appropriate to discuss prosthetic placement.  They understand that prostheses, while very good, are at this center considered to be the course of last resort.  They are such in that they have a finite life span, generally in the time of about 10 years, and with revision, the complication rate is higher.  The patient was also counseled with the option of continued watchful waiting.      On PE I found a PD plaque, but patients states that he did not noticed, but on interrogation he admits slight upward curvature; does not preclude intercourse.     Total time in the office face to face was 45 mg and well over half of that was devoted to counseling, education, and coordination of care.      Chief Complaint   Patient presents with   ??? Inguinal Hernia     Pt ref'd by Marijo ConceptionMichelle Paulsen,MD for hernia pains level 3, has swelling on right groin, pt states associated w/ food ( PORK) and gas, pt states has nothing to do w/ any activities , can not pin point pain      HISTORY OF PRESENT ILLNESS:  Mark Jacobs is a 68 y.o. male who presents today for consultation and has been referred by  Dr. Gregery Na, MD for evaluation and management of right inguinal pain. Pain has been present for the last 10 years, with intermittence occurrence. Pain is variable in intensity and he does identify food as triggers, specially pork products. Exercise, interestingly does not increase the pain, but on the contrary, it alleviates pain. He admits his life is very sedentary, and this does not help.    He also wants to discus ED symptoms. For the last 10 years has noticed progressive difficulty to attain and obtain penile rigidity, with more progression over last 2 years, and currently has no response to Cialis, and initially was effective.         Has had abdominal surgery for apparent bowel infection (diverticulitis?) and vasectomy with reversal (had children after)    Review of Systems  Constitutional: Fever: No  Skin: Rash: No  HEENT: Hearing difficulty: Yes  Eyes: Blurred vision: No  Cardiovascular: Chest pain: No  Respiratory: Shortness of breath: No  Gastrointestinal: Nausea/vomiting: No  Musculoskeletal: Back pain: No  Neurological: Weakness: No  Psychological: Memory loss: No  Comments/additional findings:                 Past Medical History   Diagnosis Date   ??? GERD (gastroesophageal reflux disease)    ??? Constipation    ??? Colon polyps    ??? Diverticulosis    ??? Internal hemorrhoids    ??? Elevated BP    ??? RLQ abdominal pain    ??? Diverticulitis    ??? Hearing loss    ??? Tick bite    ??? Tinnitus    ??? Paresthesia        Past Surgical History   Procedure Laterality Date   ??? Hx vasectomy  1978     w/ reversal   ??? Hx hernia repair  1978     L inguinal    ??? Hx colectomy  2015       History   Substance Use Topics   ??? Smoking status: Former Smoker   ??? Smokeless tobacco: Former Neurosurgeon   ??? Alcohol Use: 4.2 oz/week     7 Glasses of wine per week       No Known Allergies    Family History   Problem Relation Age of Onset   ??? Colon Cancer Mother        Current Outpatient Prescriptions   Medication Sig Dispense Refill   ??? tadalafil (CIALIS) 5 mg tablet Take 1 Tab by mouth daily as needed. 30 Tab 3   ??? esomeprazole (NEXIUM) 20 mg capsule Take  by mouth daily.           PHYSICAL EXAMINATION:   BP 148/82 mmHg   Ht 6\' 1"  (1.854 m)   Wt 235 lb (106.595 kg)   BMI 31.01 kg/m2  Constitutional: WDWN, Pleasant and appropriate affect, No acute distress.    CV:  No peripheral swelling noted  Respiratory: No respiratory distress or difficulties  Abdomen:  No abdominal masses or tenderness. No CVA tenderness. No inguinal hernias noted.   GU Male:    SCROTUM:  No scrotal rash or lesions noticed.  Normal bilateral testes and epididymis.    PENIS: Urethral meatus normal in location and size. No urethral discharge. Has Peyronie's disease plaque, dorsal, subcoronal, non tender, 2 x 2 cm  Skin: No jaundice.    Neuro/Psych:  Alert and oriented x 3, affect appropriate.   Lymphatic:   No enlarged inguinal lymph nodes.  REVIEW OF LABS AND IMAGING:      Results for orders placed or performed in visit on 09/26/14   AMB POC URINALYSIS DIP STICK AUTO W/O MICRO   Result Value Ref Range    Color (UA POC) Yellow     Clarity (UA POC) Clear     Glucose (UA POC) Negative Negative    Bilirubin (UA POC) Negative Negative    Ketones (UA POC) Negative Negative    Specific gravity (UA POC) 1.020 1.001 - 1.035    Blood (UA POC) Negative Negative    pH (UA POC) 6 4.6 - 8.0    Protein (UA POC) Negative Negative mg/dL    Urobilinogen (UA POC) 0.2 mg/dL 0.2 - 1    Nitrites (UA POC) Negative Negative    Leukocyte esterase (UA POC) Negative Negative         A copy of today's office visit with all pertinent imaging results and labs were sent to the referring physician, Dr. Gregery NaMICHELLE M PAULSEN, MD        Scot Dockamon Cyndee Giammarco, MD on 09/26/2014

## 2014-10-23 LAB — CBC WITH AUTOMATED DIFF
BASOPHILS: 0.2 % (ref 0–3)
EOSINOPHILS: 0.5 % (ref 0–5)
HCT: 49.4 % (ref 37.0–50.0)
HGB: 17.2 gm/dl (ref 12.4–17.2)
IMMATURE GRANULOCYTES: 0.2 % (ref 0.0–3.0)
LYMPHOCYTES: 17.3 % — ABNORMAL LOW (ref 28–48)
MCH: 32.1 pg (ref 23.0–34.6)
MCHC: 34.8 gm/dl (ref 30.0–36.0)
MCV: 92.3 fL (ref 80.0–98.0)
MONOCYTES: 6.9 % (ref 1–13)
MPV: 11.5 fL — ABNORMAL HIGH (ref 6.0–10.0)
NEUTROPHILS: 74.9 % — ABNORMAL HIGH (ref 34–64)
NRBC: 0 (ref 0–0)
PLATELET: 145 10*3/uL (ref 140–450)
RBC: 5.35 M/uL (ref 3.80–5.70)
RDW-SD: 42.7 (ref 35.1–43.9)
WBC: 6.2 10*3/uL (ref 4.0–11.0)

## 2014-10-23 LAB — METABOLIC PANEL, BASIC
BUN: 11 mg/dl (ref 7–25)
CO2: 25 mEq/L (ref 21–32)
Calcium: 8.4 mg/dl — ABNORMAL LOW (ref 8.5–10.1)
Chloride: 108 mEq/L — ABNORMAL HIGH (ref 98–107)
Creatinine: 1.1 mg/dl (ref 0.6–1.3)
GFR est AA: >60
GFR est non-AA: >60
Glucose: 94 mg/dl (ref 74–106)
Potassium: 3.7 mEq/L (ref 3.5–5.1)
Sodium: 141 mEq/L (ref 136–145)

## 2014-10-23 LAB — BNP: BNP: 75 pg/ml (ref 0.0–99.0)

## 2014-10-23 LAB — TROPONIN I: Troponin-I: <0.015 ng/ml (ref 0.00–0.09)

## 2014-10-23 LAB — MAGNESIUM: Magnesium: 2 mg/dl (ref 1.8–2.4)

## 2014-10-23 NOTE — ED Provider Notes (Addendum)
[facility]  EMERGENCY DEPARTMENT TREATMENT REPORT  NAME:  Mark Jacobs, Mark Jacobs  SEX:   M  ADMIT: 10/23/2014  DOB:   September 26, 1946  MR#    [id number]  ROOM:    TIME DICTATED: 05 25 PM  ACCT#  [id number]    cc: Dr. Daryl Eastern MD    DATE OF SERVICE:   10/23/2014     TIME OF EVALUATION:   1456    PRIMARY CARE PROVIDER:   Dr. Tyna Jaksch    CARDIOLOGIST:  Maryellen Pile, M.D.     CHIEF COMPLAINT:   Palpitations, rapid heart rate.     HISTORY OF PRESENT ILLNESS:    The patient is a 68 year old male who arrives by ambulance with complaints of  palpitations and an elevated heart rate while at a stress test today at the  office of Dr. [facility].  The patient is being  worked up by cardiology and his primary care provider for episodes of  palpitations for the past several months.  He reports he has had palpitations  for several years but they have been very intermittent and have only recently  become more regular.  He reports he was a stress test, had felt fine before  going in, had not had any palpitations this morning prior to getting to the  study.  During the study was noted to have inappropriate dyspnea on exertion  during stress test.  It was noted he went into atrial fibrillation with rapid  ventricular rate for which he received metoprolol 1 time over at the office of  Dr. Dewitt Rota.  There was some resolution in his heart rate but he quickly went  back into atrial fibrillation with rapid ventricular rate which prompted them  to call 911.  The patient has never been noted to have gone into atrial  fibrillation on previous evaluations.  He reports he has a very mild chest  pain when his heart rate but currently is not having any significant pain,  does not notice any palpitations, and is not particularly short of breath at  this time.    REVIEW OF SYSTEMS:   CONSTITUTIONAL:  The patient denies fever, chills, recent weight loss or gain.   He reports a flushing sensation, diaphoresis, when he is having episodes of  palpitations.  HEENT:   Denies double vision, blurred vision or other changes in vision.  He  reports  runny nose, denies congestion, sore throat.   HEME:  Denies abnormal bruising or bleeding.  ALLERGY:  He reports seasonal allergies.  RESPIRATORY:  He denies shortness of breath, orthopnea, dyspnea on exertion or  hemoptysis.  CARDIOVASCULAR:  He denies chest pain.  Does report palpitations but did not  note them while at the doctor's office today.  He denies edema in the lower  extremities.    GASTROINTESTINAL:  He denies abdominal pain, no nausea, vomiting or diarrhea.  He reports chronic constipation  GENITOURINARY:   He denies dysuria, hematuria or frequency  MUSCULOSKELETAL:  Denies back pain.  Does report bilateral knee pain,  consistent with prior arthritis.  Denies any myalgias.  SKIN:  Denies rash,  itching or other lesions.  NEUROLOGIC:  Denies headache, numbness, weakness, paresthesias.  PSYCHIATRIC:  Denies anxiety, depression.      PAST MEDICAL HISTORY:   Significant for diverticulitis, hypertension, mild chronic kidney disease,  left eye central retinal artery occlusion.    PAST SURGICAL HISTORY:   Significant for colon resection in the setting  of diverticulitis and abscess,  left hernia repair in 1978.    SOCIAL HISTORY:   Reports no current or former smoking history.  He had been drinking 1 to 2  glasses of wine per night up until a week ago when he kind of tapered back  down and now  drinks 1 glass every other night.  He denies any illicit drug  use.     FAMILY HISTORY:   Father had lung cancer, mother had episode of diverticulitis for which she had  bowel removed.    MEDICATIONS:  Lisinopril.  He had been taking Advil until the past 2 weeks when Dr. Tyna JakschPalmetto  told him that given his renal dysfunction he should not be on this medication.    ALLERGIES:  NO KNOWN DRUG ALLERGIES.     PHYSICAL EXAMINATION:    VITAL SIGNS:  Heart rate 150, blood pressure 147/99, respiratory 18,  pulse  oximetry 98% on room air, temperature 98.2  CONSTITUTIONAL:  The patient is an elderly, well-developed, well-nourished  male who does not appear to be in significant distress.  Appearance and  behavior otherwise age and situation appropriate.   HEENT:   Eyes:  Conjunctivae clear, lids normal.  Pupils equal, symmetrical,  and normally reactive.  Hearing is slightly impaired.  The patient has a  right-sided hearing aid with appropriate correction of deficit.  External  examination of ears and nose otherwise unremarkable.  Mouth/Throat:  Surfaces  of the pharynx, palate, and tongue are pink, moist, and without lesions.   NECK:  Supple, nontender, symmetrical, no masses or JVD, trachea midline,  thyroid not enlarged, nodular, or tender.   LYMPHATICS:  No cervical or submandibular lymphadenopathy palpated.   RESPIRATORY:  Clear and equal breath sounds.  No respiratory distress,  tachypnea, or accessory muscle use.     CARDIOVASCULAR:  Irregular rhythm tachycardic rate between 150 to 160 with no  murmurs, gallops, rubs or thrills.  Pulses are 2+ in bilateral radial and  dorsalis pedis.  There is no evidence of edema in the lower extremities and  the calves are soft and nontender.    GASTROINTESTINAL:  Abdomen is felt to be nontender nondistended.  There is no  evidence of rigidity or rebound tenderness on examination.  MUSCULOSKELETAL:   Stance and gait appear normal.  No clubbing deformities of  extremities.  Nailbeds pink with prompt capillary refill.   SKIN:  Warm and dry without rashes.   NEUROLOGIC:  The  patient is alert and oriented to person, place and time.  Sensation is intact to light touch throughout.  Motor strength is 5/5  bilateral upper and lower extremities.  Cranial nerves are grossly intact.   PSYCHIATRIC:  The patient does not appear anxious and his judgment is  otherwise appropriate.    CONTINUATION BY DR. Rocky LinkKEN MCMANUS:      INITIAL ASSESSMENT AND MANAGEMENT PLAN:   Differential diagnosis includes atrial fibrillation with rapid ventricular  rate, new onset atrial fibrillation, supraventricular tachycardia, acute  coronary syndrome, acute heart failure.  Initial plan: This patient was in the  midst of getting a stress test when he went into atrial fibrillation with  rapid ventricular rate. He does have a long history of palpitations without  any prior documented cases where he has been noted to be in atrial  fibrillation. The patient is hemodynamically stable with no indications for  immediate cardioversion. We will attempt rate control with diltiazem here in  the ED.  The patient did  have some response to metoprolol prior to arrival.  We will obtain labs to include electrolytes and troponin; however, the patient  does not have any chest pain  suggestive of an acute coronary event and he is  not a candidate for having a stress test at this time given his failure to  complete one earlier today.  Discussed with Dr. Dewitt Rota, who felt that if the  patient  was rate controlled or converted out of atrial fibrillation, he would  be appropriate for outpatient followup.  We will reassess patient after rate  control and determine whether or not he can be discharged home safely.    DIAGNOSTIC RESULTS:   EKG done at 1454 notable for atrial fibrillation with rapid ventricular rate  between 150 to 160.  There are no ST or T-wave changes suggestive of ischemia  or infarction.  BMP: Sodium is 141, potassium is 3.7, chloride is 108, carbon  dioxide is 25,  BUN 11, creatine 1.1, glucose is 94, calcium is slightly  decreased at 8.4.  Magnesium is 2.  CBC: White blood cell count  is 6.2,  hemoglobin of 17.2, hematocrit  49.4, platelet count of 145.  BNP normal at  75.  Troponin I is less than 0.015.  Chest x-ray shows no acute  cardiopulmonary process.      REEVALUATION/EMERGENCY DEPARTMENT COURSE:    I reevaluated the patient after he received diltiazem bolus 10 mg and then  infusion was started at 5 mg per hour.  The patient had conversion to normal  sinus rhythm.  Diltiazem was stopped at this time.  The patient reported no  significant symptoms. He had not been reporting palpitations prior to the  conversion to sinus rhythm and he continued to have no complaint of  palpitations on reevaluation.  Discussed with Dr. Dewitt Rota, the patient's  cardiologist at 1620. Given that the patient has a CHADS-VASc score of 2, it  is recommended that he be on anticoagulation. Given the fact that the patient  does have some baseline chronic kidney disease, Dr. Dewitt Rota felt that it would  be appropriate to start him on aspirin and have him follow up with him in the  office on Friday, at which time he will determine an appropriate  anticoagulation regimen, likely Eliquis.  Discussed plan to have the patient  call Dr. Luisa Hart office for any episodes of recurrent palpitations or return  to the ER if it is after business hours.  Discussed with the patient who is  agreeable with this plan and is comfortable with discharge home at this time.    CLINICAL IMPRESSION/MEDICAL DECISION MAKING/PLAN:    This is a 68 year old with a history of palpitations over the past several  years, who presents with atrial fibrillation with rapid ventricular rate. He  is hemodynamically stable with no indications for emergent cardioversion in  the ED. Rate control initiated with diltiazem bolus and infusion with  conversion to a normal sinus rhythm.  The patient had no significant chest  pain and had a negative initial troponin.  He had been getting a stress test  at the office of Dr. Dewitt Rota, more for workup of his palpitations.  Chest pain,  palpitations, and other symptoms all resolved with treatment in the ED.  EKG  is without any other changes suggestive of ischemia or infarction and a normal   sinus rhythm at a rate of 70 to 80 was noted and sustained after discontinuing  Cardizem.  Discussed further with the patient's  cardiologist, Dr. Dewitt RotaKakani  with plans to start the patient on aspirin at this time and have him follow up  with Dr. Dewitt RotaKakani on Friday for consideration of placement on anticoagulation  given his CHADS-VASc score of 2. Discussed return precautions, treatment and  followup plan with the patient, who demonstrated understanding and agreement.     FINAL DIAGNOSIS:   Atrial fibrillation with rapid ventricular rate, resolved.    DISPOSITION:  Discharge home.    CONDITION:  Improved, satisfactory.      RESIDENT LINKING STATEMENT:   The patient was personally evaluated by myself and Dr. Dixie DialsBen Jonte Wollam, who  agrees with the above assessment and plan.     CONTINUATION BY Dixie DialsBEN Dulcy Sida, MD:    I interviewed and examined the patient.  I discussed with the resident and  agree with their evaluation and plan as documented here. I have seen the  patient, he has converted to a normal sinus rhythm.  We are going to wait  until the results of his lab tests.  We are going to give him oral Lopressor.  Dr. Dewitt RotaKakani indicated that he felt the patient safe to be discharged to home  and he would follow up with him this week regarding completion of stress  testing, etc.  He looks well and is without complaints including active chest  pain.      ___________________  Christiana PellantBen A Lexianna Weinrich MD  Dictated By: Johny ShockKenneth McManus, DO    My signature above authenticates this document and my orders, the final  diagnosis (es), discharge prescription (s), and instructions in the [facility] record.  Nursing notes have been reviewed by the physician/mid-level provider.    If you have any questions please contact [phone].    DS  D:10/23/2014 17:25:46  T: 10/23/2014 23:44:09  [id number]  Electronically Authenticated by:  Carlis StableBen A. Carmela HurtFickenscher, M.D. On 10/28/2014 07:20 AM EST

## 2014-11-16 DIAGNOSIS — G603 Idiopathic progressive neuropathy: Secondary | ICD-10-CM | POA: Diagnosis not present

## 2014-11-16 HISTORY — PX: CARDIAC CATHETERIZATION: SHX172

## 2014-11-20 DIAGNOSIS — I480 Paroxysmal atrial fibrillation: Secondary | ICD-10-CM | POA: Diagnosis not present

## 2014-11-20 DIAGNOSIS — I2510 Atherosclerotic heart disease of native coronary artery without angina pectoris: Secondary | ICD-10-CM | POA: Diagnosis not present

## 2014-11-20 DIAGNOSIS — E669 Obesity, unspecified: Secondary | ICD-10-CM | POA: Diagnosis not present

## 2014-11-20 DIAGNOSIS — I712 Thoracic aortic aneurysm, without rupture: Secondary | ICD-10-CM | POA: Diagnosis not present

## 2014-11-20 DIAGNOSIS — I351 Nonrheumatic aortic (valve) insufficiency: Secondary | ICD-10-CM | POA: Diagnosis not present

## 2014-11-20 DIAGNOSIS — I10 Essential (primary) hypertension: Secondary | ICD-10-CM | POA: Diagnosis not present

## 2014-11-22 DIAGNOSIS — K5900 Constipation, unspecified: Secondary | ICD-10-CM | POA: Diagnosis not present

## 2014-11-22 DIAGNOSIS — Z1211 Encounter for screening for malignant neoplasm of colon: Secondary | ICD-10-CM | POA: Diagnosis not present

## 2014-11-22 DIAGNOSIS — R1010 Upper abdominal pain, unspecified: Secondary | ICD-10-CM | POA: Diagnosis not present

## 2014-11-26 DIAGNOSIS — F5109 Other insomnia not due to a substance or known physiological condition: Secondary | ICD-10-CM | POA: Diagnosis not present

## 2014-11-26 DIAGNOSIS — F430 Acute stress reaction: Secondary | ICD-10-CM | POA: Diagnosis not present

## 2014-11-26 DIAGNOSIS — I10 Essential (primary) hypertension: Secondary | ICD-10-CM | POA: Diagnosis not present

## 2014-11-26 DIAGNOSIS — E780 Pure hypercholesterolemia: Secondary | ICD-10-CM | POA: Diagnosis not present

## 2014-11-28 DIAGNOSIS — H2513 Age-related nuclear cataract, bilateral: Secondary | ICD-10-CM | POA: Diagnosis not present

## 2014-11-28 DIAGNOSIS — H59032 Cystoid macular edema following cataract surgery, left eye: Secondary | ICD-10-CM | POA: Diagnosis not present

## 2014-11-28 DIAGNOSIS — Z0101 Encounter for examination of eyes and vision with abnormal findings: Secondary | ICD-10-CM | POA: Diagnosis not present

## 2014-11-28 DIAGNOSIS — H34832 Tributary (branch) retinal vein occlusion, left eye: Secondary | ICD-10-CM | POA: Diagnosis not present

## 2014-11-28 DIAGNOSIS — H524 Presbyopia: Secondary | ICD-10-CM | POA: Diagnosis not present

## 2014-12-11 DIAGNOSIS — R29898 Other symptoms and signs involving the musculoskeletal system: Secondary | ICD-10-CM | POA: Diagnosis not present

## 2014-12-11 DIAGNOSIS — G603 Idiopathic progressive neuropathy: Secondary | ICD-10-CM | POA: Diagnosis not present

## 2014-12-11 DIAGNOSIS — I4891 Unspecified atrial fibrillation: Secondary | ICD-10-CM | POA: Diagnosis not present

## 2014-12-11 DIAGNOSIS — R0683 Snoring: Secondary | ICD-10-CM | POA: Diagnosis not present

## 2014-12-11 DIAGNOSIS — R202 Paresthesia of skin: Secondary | ICD-10-CM | POA: Diagnosis not present

## 2014-12-14 DIAGNOSIS — R202 Paresthesia of skin: Secondary | ICD-10-CM | POA: Diagnosis not present

## 2014-12-17 DIAGNOSIS — R0683 Snoring: Secondary | ICD-10-CM | POA: Diagnosis not present

## 2014-12-18 DIAGNOSIS — Z23 Encounter for immunization: Secondary | ICD-10-CM | POA: Diagnosis not present

## 2014-12-20 ENCOUNTER — Encounter

## 2014-12-24 ENCOUNTER — Ambulatory Visit: Admit: 2014-12-24 | Discharge: 2014-12-24 | Payer: MEDICARE | Attending: Urology | Primary: Family Medicine

## 2014-12-24 DIAGNOSIS — R351 Nocturia: Secondary | ICD-10-CM | POA: Diagnosis not present

## 2014-12-24 LAB — AMB POC URINALYSIS DIP STICK AUTO W/O MICRO
Bilirubin (UA POC): NEGATIVE
Blood (UA POC): NEGATIVE
Glucose (UA POC): NEGATIVE
Ketones (UA POC): NEGATIVE
Leukocyte esterase (UA POC): NEGATIVE
Nitrites (UA POC): NEGATIVE
Protein (UA POC): NEGATIVE mg/dL
Specific gravity (UA POC): 1.03 (ref 1.001–1.035)
Urobilinogen (UA POC): 0.2 (ref 0.2–1)
pH (UA POC): 5.5 (ref 4.6–8.0)

## 2014-12-24 LAB — AMB POC PVR, MEAS,POST-VOID RES,US,NON-IMAGING

## 2014-12-24 NOTE — Progress Notes (Signed)
ASSESSMENT:     ICD-10-CM ICD-9-CM    1. Nocturia R35.1 788.43 AMB POC PVR, MEAS,POST-VOID RES,US,NON-IMAGING      AMB POC URINALYSIS DIP STICK AUTO W/O MICRO       PLAN:    1. Continue Cialis 5 mg daily, Discussed Cialis daily dose 5 mg with possible side including headache,upset stomach, back pain and facial flushing.  The patient is not taking nitrates.  2. Loose weight; he agrees that this is something he needs to do for himself.  3. Groin pain mainly GI related, he will seek attention with specialist. Eventually we could consider PT for pelvic/groin pain, but [place]. Re discuss this at last visit.   4. Cut back caffeine.   5. Next visit Flow and PVR    DISCUSSION:   Erectile Dysfunction Counseling    The patient was referred from management of his erectile dysfunction by Gregery NaMICHELLE M PAULSEN, MD .  The patient has had prior attempts at management of his erectile dysfunction.  The patient in the past has tried Viagra and Cialis with good initial response, and more recently very poor.   The pathophysiology and physiology of erections were discussed using the model that illustrates the difference between the flaccid and erect penis, and then using a smooth-muscle cell model I explained how an erection occurs.  The cyclic GMP and the cyclic AMP mechanisms were discussed as well as the place for alpha-sympathetic tone.      I then discussed with the patient from the standpoint of ???potential targets??? how pharmacologic therapy works and how combination therapy can be more efficacious than monotherapy.  Additionally, the place for oral therapy as well as urethral suppositories was also discussed with the patient.      The patient was told that the approach here is truly to begin with the less invasive therapy which would be oral therapy, moving on to intracorporal therapy if we could not get a successful result with oral  therapy and if we could not get a success result with intracavernosal therapy then it might be appropriate to discuss prosthetic placement.  They understand that prostheses, while very good, are at this center considered to be the course of last resort.  They are such in that they have a finite life span, generally in the time of about 10 years, and with revision, the complication rate is higher.  The patient was also counseled with the option of continued watchful waiting.      On PE I found a PD plaque, but patients states that he did not noticed, but on interrogation he admits slight upward curvature; does not preclude intercourse.     Total time in the office face to face was 45 mg and well over half of that was devoted to counseling, education, and coordination of care.      Chief Complaint   Patient presents with   ??? Benign Prostatic Hypertrophy     pt is here for a 3 month F/U with Uroflow and PVR (Unable to do uroflow due to pt not being able to urinate much)   ??? Erectile Dysfunction     CIALIS DOING GREAT FOR E.D., BUT HAS NOT BEEN TAKING DAILY AS DIRECTED-DID NOT UNDERSTAND TALKING FOR BPH SX'S   ??? Nocturia     up 2-3x/night     HISTORY OF PRESENT ILLNESS:  Mark Jacobs is a 69 y.o. male with h/o BPH, LUTS, and ED who presents today  in follow up. Since last visit he has was taking Cialis  daily which worked well for ED, however, he decreased usage to PRN. He is not bothered by voiding symptoms.   Improved since he discontinued       Hx gathered at last visit:  Presents today for consultation and has been referred by Dr. Gregery Na, MD for evaluation and management of right inguinal pain. Pain has been present for the last 10 years, with intermittence occurrence. Pain is variable in intensity and he does identify food as triggers, specially pork products. Exercise, interestingly does not increase the pain, but on the contrary, it alleviates pain. He admits his life is very sedentary,  and this does not help.    He also wants to discus ED symptoms. For the last 10 years has noticed progressive difficulty to attain and obtain penile rigidity, with more progression over last 2 years, and currently has no response to Cialis, and initially was effective.        Has had abdominal surgery for apparent bowel infection (diverticulitis?) and vasectomy with reversal (had children after)    Review of Systems  Constitutional: Fever: No  Skin: Rash: No  HEENT: Hearing difficulty: Yes  Eyes: Blurred vision: No  Cardiovascular: Chest pain: No  Respiratory: Shortness of breath: No  Gastrointestinal: Nausea/vomiting: No  Musculoskeletal: Back pain: No  Neurological: Weakness: No  Psychological: Memory loss: No  Comments/additional findings:                 Past Medical History   Diagnosis Date   ??? GERD (gastroesophageal reflux disease)    ??? Constipation    ??? Colon polyps    ??? Diverticulosis    ??? Internal hemorrhoids    ??? Elevated BP    ??? RLQ abdominal pain    ??? Diverticulitis    ??? Hearing loss    ??? Tick bite    ??? Tinnitus    ??? Paresthesia    ??? Erectile dysfunction    ??? Lower urinary tract symptoms (LUTS)    ??? Groin pain    ??? Peyronie's disease        Past Surgical History   Procedure Laterality Date   ??? Hx vasectomy  1978     w/ reversal   ??? Hx hernia repair  1978     L inguinal    ??? Hx colectomy  2015       History   Substance Use Topics   ??? Smoking status: Former Smoker   ??? Smokeless tobacco: Former Neurosurgeon   ??? Alcohol Use: 4.2 oz/week     7 Glasses of wine per week       No Known Allergies    Family History   Problem Relation Age of Onset   ??? Colon Cancer Mother        Current Outpatient Prescriptions   Medication Sig Dispense Refill   ??? losartan (COZAAR) 25 mg tablet Take  by mouth daily.     ??? atenolol (TENORMIN) 50 mg tablet Take  by mouth daily.     ??? amLODIPine-atorvastatin (CADUET) 10-20 mg per tablet Take 1 Tab by mouth daily.     ??? esomeprazole (NEXIUM) 20 mg capsule Take  by mouth daily.      ??? tadalafil (CIALIS) 5 mg tablet Take 1 Tab by mouth daily as needed. 30 Tab 3         PHYSICAL EXAMINATION:   BP 112/82 mmHg  Ht  (1.854 m)   Wt 235 lb (106.595 kg)   BMI 31.01 kg/m2  Constitutional: WDWN, Pleasant and appropriate affect, No acute distress.    CV:  No peripheral swelling noted  Respiratory: No respiratory distress or difficulties  Abdomen:  No abdominal masses or tenderness. No CVA tenderness. No inguinal hernias noted.   GU Male:    SCROTUM:  No scrotal rash or lesions noticed.  Normal bilateral testes and epididymis.   PENIS: Urethral meatus normal in location and size. No urethral discharge. Has Peyronie's disease plaque, dorsal, subcoronal, non tender, 2 x 2 cm  Skin: No jaundice.    Neuro/Psych:  Alert and oriented x 3, affect appropriate.   Lymphatic:   No enlarged inguinal lymph nodes.        REVIEW OF LABS AND IMAGING:      Results for orders placed or performed in visit on 12/24/14   AMB POC PVR, MEAS,POST-VOID RES,US,NON-IMAGING   Result Value Ref Range    PVR 83ml cc   AMB POC URINALYSIS DIP STICK AUTO W/O MICRO   Result Value Ref Range    Color (UA POC) Yellow     Clarity (UA POC) Clear     Glucose (UA POC) Negative Negative    Bilirubin (UA POC) Negative Negative    Ketones (UA POC) Negative Negative    Specific gravity (UA POC) 1.030 1.001 - 1.035    Blood (UA POC) Negative Negative    pH (UA POC) 5.5 4.6 - 8.0    Protein (UA POC) Negative Negative mg/dL    Urobilinogen (UA POC) 0.2 mg/dL 0.2 - 1    Nitrites (UA POC) Negative Negative    Leukocyte esterase (UA POC) Negative Negative         A copy of today's office visit with all pertinent imaging results and labs were sent to the referring physician, Dr. Gregery Na, MD        Scot Dock, MD on 12/24/2014       Medical documentation provided with the assistance of [facility] [employer], medical scribe to Scot Dock, MD.

## 2014-12-27 ENCOUNTER — Ambulatory Visit: Payer: MEDICARE | Primary: Family Medicine

## 2015-01-01 DIAGNOSIS — I493 Ventricular premature depolarization: Secondary | ICD-10-CM | POA: Diagnosis not present

## 2015-01-01 DIAGNOSIS — I480 Paroxysmal atrial fibrillation: Secondary | ICD-10-CM | POA: Diagnosis not present

## 2015-01-03 DIAGNOSIS — E785 Hyperlipidemia, unspecified: Secondary | ICD-10-CM | POA: Diagnosis not present

## 2015-01-03 DIAGNOSIS — I4940 Unspecified premature depolarization: Secondary | ICD-10-CM | POA: Diagnosis not present

## 2015-01-03 DIAGNOSIS — G4769 Other sleep related movement disorders: Secondary | ICD-10-CM | POA: Diagnosis not present

## 2015-01-03 DIAGNOSIS — I10 Essential (primary) hypertension: Secondary | ICD-10-CM | POA: Diagnosis not present

## 2015-01-03 DIAGNOSIS — K219 Gastro-esophageal reflux disease without esophagitis: Secondary | ICD-10-CM | POA: Diagnosis not present

## 2015-01-03 DIAGNOSIS — G4733 Obstructive sleep apnea (adult) (pediatric): Secondary | ICD-10-CM | POA: Diagnosis not present

## 2015-01-03 DIAGNOSIS — I4891 Unspecified atrial fibrillation: Secondary | ICD-10-CM | POA: Diagnosis not present

## 2015-01-04 ENCOUNTER — Inpatient Hospital Stay: Admit: 2015-01-04 | Payer: MEDICARE | Attending: Neurology | Primary: Family Medicine

## 2015-01-04 DIAGNOSIS — I480 Paroxysmal atrial fibrillation: Secondary | ICD-10-CM | POA: Diagnosis not present

## 2015-01-04 DIAGNOSIS — I493 Ventricular premature depolarization: Secondary | ICD-10-CM | POA: Diagnosis not present

## 2015-01-04 NOTE — Progress Notes (Signed)
Preliminary diagnostic study report scanned in to patient's chart and routed to Dr. Kanarek for review.

## 2015-01-04 NOTE — Progress Notes (Signed)
Sleep study completed per physician???s order.

## 2015-01-07 DIAGNOSIS — M6281 Muscle weakness (generalized): Secondary | ICD-10-CM | POA: Diagnosis not present

## 2015-01-07 DIAGNOSIS — R202 Paresthesia of skin: Secondary | ICD-10-CM | POA: Diagnosis not present

## 2015-01-08 DIAGNOSIS — E663 Overweight: Secondary | ICD-10-CM | POA: Diagnosis not present

## 2015-01-08 DIAGNOSIS — E780 Pure hypercholesterolemia: Secondary | ICD-10-CM | POA: Diagnosis not present

## 2015-01-08 DIAGNOSIS — G4733 Obstructive sleep apnea (adult) (pediatric): Secondary | ICD-10-CM | POA: Diagnosis not present

## 2015-01-08 DIAGNOSIS — I10 Essential (primary) hypertension: Secondary | ICD-10-CM | POA: Diagnosis not present

## 2015-01-10 DIAGNOSIS — M6281 Muscle weakness (generalized): Secondary | ICD-10-CM | POA: Diagnosis not present

## 2015-01-10 DIAGNOSIS — R202 Paresthesia of skin: Secondary | ICD-10-CM | POA: Diagnosis not present

## 2015-01-15 DIAGNOSIS — I480 Paroxysmal atrial fibrillation: Secondary | ICD-10-CM | POA: Diagnosis not present

## 2015-01-15 DIAGNOSIS — I493 Ventricular premature depolarization: Secondary | ICD-10-CM | POA: Diagnosis not present

## 2015-01-15 DIAGNOSIS — R202 Paresthesia of skin: Secondary | ICD-10-CM | POA: Diagnosis not present

## 2015-01-15 DIAGNOSIS — Z7901 Long term (current) use of anticoagulants: Secondary | ICD-10-CM | POA: Diagnosis not present

## 2015-01-15 DIAGNOSIS — M6281 Muscle weakness (generalized): Secondary | ICD-10-CM | POA: Diagnosis not present

## 2015-01-15 DIAGNOSIS — I472 Ventricular tachycardia: Secondary | ICD-10-CM | POA: Diagnosis not present

## 2015-01-17 DIAGNOSIS — R202 Paresthesia of skin: Secondary | ICD-10-CM | POA: Diagnosis not present

## 2015-01-17 DIAGNOSIS — M6281 Muscle weakness (generalized): Secondary | ICD-10-CM | POA: Diagnosis not present

## 2015-01-21 DIAGNOSIS — G4733 Obstructive sleep apnea (adult) (pediatric): Secondary | ICD-10-CM | POA: Diagnosis not present

## 2015-01-21 MED ORDER — TADALAFIL 10 MG TABLET
10 mg | ORAL_TABLET | ORAL | Status: AC
Start: 2015-01-21 — End: ?

## 2015-01-21 MED ORDER — TADALAFIL 5 MG TABLET
5 mg | ORAL_TABLET | Freq: Every day | ORAL | Status: AC | PRN
Start: 2015-01-21 — End: ?

## 2015-01-21 NOTE — Telephone Encounter (Signed)
PT CALLED TO DISCUSS RX FOR CIALIS 5 MG DAILY. NEEDS NEW WRITTEN RX'S- 1 FOR LOCAL PHARMACY AND 1 FOR MAIL ORDER PHARMACY. WILL CALL HIS INS CO TO SEE IF WE CAN GET ANYTHING COVERED BY HIS INS CO- BCBS FOR E.D. AND BPH. TOLD PT I WILL CALL PT TO LET HIM KNOW WHAT I FIND OUT.

## 2015-01-22 DIAGNOSIS — R202 Paresthesia of skin: Secondary | ICD-10-CM | POA: Diagnosis not present

## 2015-01-22 DIAGNOSIS — M6281 Muscle weakness (generalized): Secondary | ICD-10-CM | POA: Diagnosis not present

## 2015-01-22 NOTE — Telephone Encounter (Signed)
CALLED PT'S BCBS MCR SUPP PLAN- HIS RX PLAN ID # IS [id number] AND PH# FOR COVERAGE IS [phone]. CALLED FOR COVERAGE OF CIALIS 5 MG DAILY FOR PT. WILL TRY TO GET COVERED FOR BPH, ALTHOUGH HE HAS E.D. AS WELL.    CALLED PT'S INS CO AND HIS PLAN IS A MCR SUPP PLAN AND THEY ARE NOT COVERING THE CIALIS. WILL CALL PT TO LET HIM KNOW.

## 2015-01-24 DIAGNOSIS — R202 Paresthesia of skin: Secondary | ICD-10-CM | POA: Diagnosis not present

## 2015-01-24 DIAGNOSIS — M6281 Muscle weakness (generalized): Secondary | ICD-10-CM | POA: Diagnosis not present

## 2015-01-29 DIAGNOSIS — G4733 Obstructive sleep apnea (adult) (pediatric): Secondary | ICD-10-CM | POA: Diagnosis not present

## 2015-01-29 DIAGNOSIS — I351 Nonrheumatic aortic (valve) insufficiency: Secondary | ICD-10-CM | POA: Diagnosis not present

## 2015-01-29 DIAGNOSIS — R202 Paresthesia of skin: Secondary | ICD-10-CM | POA: Diagnosis not present

## 2015-01-29 DIAGNOSIS — E669 Obesity, unspecified: Secondary | ICD-10-CM | POA: Diagnosis not present

## 2015-01-29 DIAGNOSIS — I480 Paroxysmal atrial fibrillation: Secondary | ICD-10-CM | POA: Diagnosis not present

## 2015-01-29 DIAGNOSIS — M6281 Muscle weakness (generalized): Secondary | ICD-10-CM | POA: Diagnosis not present

## 2015-01-29 DIAGNOSIS — R1013 Epigastric pain: Secondary | ICD-10-CM | POA: Diagnosis not present

## 2015-01-29 DIAGNOSIS — I712 Thoracic aortic aneurysm, without rupture: Secondary | ICD-10-CM | POA: Diagnosis not present

## 2015-01-29 DIAGNOSIS — I10 Essential (primary) hypertension: Secondary | ICD-10-CM | POA: Diagnosis not present

## 2015-01-30 DIAGNOSIS — I77811 Abdominal aortic ectasia: Secondary | ICD-10-CM | POA: Diagnosis not present

## 2015-01-30 DIAGNOSIS — R1013 Epigastric pain: Secondary | ICD-10-CM | POA: Diagnosis not present

## 2015-02-01 DIAGNOSIS — R202 Paresthesia of skin: Secondary | ICD-10-CM | POA: Diagnosis not present

## 2015-02-01 DIAGNOSIS — M6281 Muscle weakness (generalized): Secondary | ICD-10-CM | POA: Diagnosis not present

## 2015-02-05 DIAGNOSIS — R7989 Other specified abnormal findings of blood chemistry: Secondary | ICD-10-CM | POA: Diagnosis not present

## 2015-02-07 DIAGNOSIS — M6281 Muscle weakness (generalized): Secondary | ICD-10-CM | POA: Diagnosis not present

## 2015-02-07 DIAGNOSIS — R202 Paresthesia of skin: Secondary | ICD-10-CM | POA: Diagnosis not present

## 2015-02-14 DIAGNOSIS — F320 Major depressive disorder, single episode, mild: Secondary | ICD-10-CM | POA: Diagnosis not present

## 2015-02-14 DIAGNOSIS — I10 Essential (primary) hypertension: Secondary | ICD-10-CM | POA: Diagnosis not present

## 2015-02-14 DIAGNOSIS — F430 Acute stress reaction: Secondary | ICD-10-CM | POA: Diagnosis not present

## 2015-02-14 DIAGNOSIS — R1031 Right lower quadrant pain: Secondary | ICD-10-CM | POA: Diagnosis not present

## 2015-02-14 DIAGNOSIS — G4733 Obstructive sleep apnea (adult) (pediatric): Secondary | ICD-10-CM | POA: Diagnosis not present

## 2015-02-20 DIAGNOSIS — R1084 Generalized abdominal pain: Secondary | ICD-10-CM | POA: Diagnosis not present

## 2015-02-20 DIAGNOSIS — I10 Essential (primary) hypertension: Secondary | ICD-10-CM | POA: Diagnosis not present

## 2015-02-20 DIAGNOSIS — K7689 Other specified diseases of liver: Secondary | ICD-10-CM | POA: Diagnosis not present

## 2015-02-26 DIAGNOSIS — Z1211 Encounter for screening for malignant neoplasm of colon: Secondary | ICD-10-CM | POA: Diagnosis not present

## 2015-02-26 DIAGNOSIS — K5900 Constipation, unspecified: Secondary | ICD-10-CM | POA: Diagnosis not present

## 2015-02-26 DIAGNOSIS — R1013 Epigastric pain: Secondary | ICD-10-CM | POA: Diagnosis not present

## 2015-03-05 DIAGNOSIS — I480 Paroxysmal atrial fibrillation: Secondary | ICD-10-CM | POA: Diagnosis not present

## 2015-03-05 DIAGNOSIS — I10 Essential (primary) hypertension: Secondary | ICD-10-CM | POA: Diagnosis not present

## 2015-03-07 DIAGNOSIS — I10 Essential (primary) hypertension: Secondary | ICD-10-CM | POA: Diagnosis not present

## 2015-03-07 DIAGNOSIS — D696 Thrombocytopenia, unspecified: Secondary | ICD-10-CM | POA: Diagnosis not present

## 2015-03-07 DIAGNOSIS — F411 Generalized anxiety disorder: Secondary | ICD-10-CM | POA: Diagnosis not present

## 2015-03-07 DIAGNOSIS — E780 Pure hypercholesterolemia: Secondary | ICD-10-CM | POA: Diagnosis not present

## 2015-03-26 DIAGNOSIS — I480 Paroxysmal atrial fibrillation: Secondary | ICD-10-CM | POA: Diagnosis not present

## 2015-03-28 DIAGNOSIS — H34832 Tributary (branch) retinal vein occlusion, left eye: Secondary | ICD-10-CM | POA: Diagnosis not present

## 2015-03-28 DIAGNOSIS — H3581 Retinal edema: Secondary | ICD-10-CM | POA: Diagnosis not present

## 2015-03-28 DIAGNOSIS — H35372 Puckering of macula, left eye: Secondary | ICD-10-CM | POA: Diagnosis not present

## 2015-03-28 DIAGNOSIS — H2513 Age-related nuclear cataract, bilateral: Secondary | ICD-10-CM | POA: Diagnosis not present

## 2015-03-28 DIAGNOSIS — H0236 Blepharochalasis left eye, unspecified eyelid: Secondary | ICD-10-CM | POA: Diagnosis not present

## 2015-03-28 DIAGNOSIS — H0233 Blepharochalasis right eye, unspecified eyelid: Secondary | ICD-10-CM | POA: Diagnosis not present

## 2015-03-28 DIAGNOSIS — H02833 Dermatochalasis of right eye, unspecified eyelid: Secondary | ICD-10-CM | POA: Diagnosis not present

## 2015-03-28 DIAGNOSIS — I4891 Unspecified atrial fibrillation: Secondary | ICD-10-CM | POA: Diagnosis not present

## 2015-03-28 DIAGNOSIS — H02836 Dermatochalasis of left eye, unspecified eyelid: Secondary | ICD-10-CM | POA: Diagnosis not present

## 2015-05-09 DIAGNOSIS — F411 Generalized anxiety disorder: Secondary | ICD-10-CM | POA: Diagnosis not present

## 2015-05-09 DIAGNOSIS — E780 Pure hypercholesterolemia: Secondary | ICD-10-CM | POA: Diagnosis not present

## 2015-05-09 DIAGNOSIS — I10 Essential (primary) hypertension: Secondary | ICD-10-CM | POA: Diagnosis not present

## 2015-05-09 DIAGNOSIS — I480 Paroxysmal atrial fibrillation: Secondary | ICD-10-CM | POA: Diagnosis not present

## 2015-05-09 DIAGNOSIS — G4733 Obstructive sleep apnea (adult) (pediatric): Secondary | ICD-10-CM | POA: Diagnosis not present

## 2015-05-09 DIAGNOSIS — R739 Hyperglycemia, unspecified: Secondary | ICD-10-CM | POA: Diagnosis not present

## 2015-05-15 DIAGNOSIS — Z974 Presence of external hearing-aid: Secondary | ICD-10-CM | POA: Diagnosis not present

## 2015-05-15 DIAGNOSIS — D539 Nutritional anemia, unspecified: Secondary | ICD-10-CM | POA: Diagnosis not present

## 2015-05-15 DIAGNOSIS — G629 Polyneuropathy, unspecified: Secondary | ICD-10-CM | POA: Diagnosis not present

## 2015-05-15 DIAGNOSIS — Z79899 Other long term (current) drug therapy: Secondary | ICD-10-CM | POA: Diagnosis not present

## 2015-05-15 DIAGNOSIS — H9190 Unspecified hearing loss, unspecified ear: Secondary | ICD-10-CM | POA: Diagnosis not present

## 2015-05-15 DIAGNOSIS — F329 Major depressive disorder, single episode, unspecified: Secondary | ICD-10-CM | POA: Diagnosis not present

## 2015-05-15 DIAGNOSIS — I351 Nonrheumatic aortic (valve) insufficiency: Secondary | ICD-10-CM | POA: Diagnosis not present

## 2015-05-15 DIAGNOSIS — I472 Ventricular tachycardia: Secondary | ICD-10-CM | POA: Diagnosis not present

## 2015-05-15 DIAGNOSIS — R001 Bradycardia, unspecified: Secondary | ICD-10-CM | POA: Diagnosis not present

## 2015-05-15 DIAGNOSIS — Z91011 Allergy to milk products: Secondary | ICD-10-CM | POA: Diagnosis not present

## 2015-05-15 DIAGNOSIS — K219 Gastro-esophageal reflux disease without esophagitis: Secondary | ICD-10-CM | POA: Diagnosis not present

## 2015-05-15 DIAGNOSIS — I712 Thoracic aortic aneurysm, without rupture: Secondary | ICD-10-CM | POA: Diagnosis not present

## 2015-05-15 DIAGNOSIS — I2510 Atherosclerotic heart disease of native coronary artery without angina pectoris: Secondary | ICD-10-CM | POA: Diagnosis not present

## 2015-05-15 DIAGNOSIS — I10 Essential (primary) hypertension: Secondary | ICD-10-CM | POA: Diagnosis not present

## 2015-05-15 DIAGNOSIS — Z800 Family history of malignant neoplasm of digestive organs: Secondary | ICD-10-CM | POA: Diagnosis not present

## 2015-05-15 DIAGNOSIS — I493 Ventricular premature depolarization: Secondary | ICD-10-CM | POA: Diagnosis not present

## 2015-05-15 DIAGNOSIS — Z801 Family history of malignant neoplasm of trachea, bronchus and lung: Secondary | ICD-10-CM | POA: Diagnosis not present

## 2015-05-15 DIAGNOSIS — E780 Pure hypercholesterolemia: Secondary | ICD-10-CM | POA: Diagnosis not present

## 2015-05-15 DIAGNOSIS — H269 Unspecified cataract: Secondary | ICD-10-CM | POA: Diagnosis not present

## 2015-05-15 DIAGNOSIS — I480 Paroxysmal atrial fibrillation: Secondary | ICD-10-CM | POA: Diagnosis not present

## 2015-05-15 DIAGNOSIS — Z7901 Long term (current) use of anticoagulants: Secondary | ICD-10-CM | POA: Diagnosis not present

## 2015-05-17 HISTORY — PX: CARDIAC ELECTROPHYSIOLOGY MAPPING AND ABLATION: SHX1292

## 2015-05-23 DIAGNOSIS — I493 Ventricular premature depolarization: Secondary | ICD-10-CM | POA: Diagnosis not present

## 2015-05-23 DIAGNOSIS — I2510 Atherosclerotic heart disease of native coronary artery without angina pectoris: Secondary | ICD-10-CM | POA: Diagnosis not present

## 2015-05-23 DIAGNOSIS — I10 Essential (primary) hypertension: Secondary | ICD-10-CM | POA: Diagnosis not present

## 2015-05-23 DIAGNOSIS — E780 Pure hypercholesterolemia: Secondary | ICD-10-CM | POA: Diagnosis not present

## 2015-05-23 DIAGNOSIS — I472 Ventricular tachycardia: Secondary | ICD-10-CM | POA: Diagnosis not present

## 2015-05-23 DIAGNOSIS — I480 Paroxysmal atrial fibrillation: Secondary | ICD-10-CM | POA: Diagnosis not present

## 2015-05-24 DIAGNOSIS — I4891 Unspecified atrial fibrillation: Secondary | ICD-10-CM | POA: Diagnosis not present

## 2015-05-24 DIAGNOSIS — I493 Ventricular premature depolarization: Secondary | ICD-10-CM | POA: Diagnosis not present

## 2015-05-24 DIAGNOSIS — I10 Essential (primary) hypertension: Secondary | ICD-10-CM | POA: Diagnosis not present

## 2015-05-24 DIAGNOSIS — I2510 Atherosclerotic heart disease of native coronary artery without angina pectoris: Secondary | ICD-10-CM | POA: Diagnosis not present

## 2015-05-24 DIAGNOSIS — I480 Paroxysmal atrial fibrillation: Secondary | ICD-10-CM | POA: Diagnosis not present

## 2015-05-24 DIAGNOSIS — E780 Pure hypercholesterolemia: Secondary | ICD-10-CM | POA: Diagnosis not present

## 2015-05-24 DIAGNOSIS — I472 Ventricular tachycardia: Secondary | ICD-10-CM | POA: Diagnosis not present

## 2015-06-04 DIAGNOSIS — I480 Paroxysmal atrial fibrillation: Secondary | ICD-10-CM | POA: Diagnosis not present

## 2015-06-04 DIAGNOSIS — G4733 Obstructive sleep apnea (adult) (pediatric): Secondary | ICD-10-CM | POA: Diagnosis not present

## 2015-06-05 DIAGNOSIS — Z1211 Encounter for screening for malignant neoplasm of colon: Secondary | ICD-10-CM | POA: Diagnosis not present

## 2015-06-05 DIAGNOSIS — K5900 Constipation, unspecified: Secondary | ICD-10-CM | POA: Diagnosis not present

## 2015-06-24 ENCOUNTER — Ambulatory Visit: Admit: 2015-06-24 | Discharge: 2015-06-24 | Payer: MEDICARE | Attending: Urology | Primary: Family Medicine

## 2015-06-24 DIAGNOSIS — R351 Nocturia: Secondary | ICD-10-CM | POA: Diagnosis not present

## 2015-06-24 DIAGNOSIS — R339 Retention of urine, unspecified: Secondary | ICD-10-CM | POA: Diagnosis not present

## 2015-06-24 DIAGNOSIS — N400 Enlarged prostate without lower urinary tract symptoms: Secondary | ICD-10-CM | POA: Diagnosis not present

## 2015-06-24 DIAGNOSIS — N529 Male erectile dysfunction, unspecified: Secondary | ICD-10-CM | POA: Diagnosis not present

## 2015-06-24 LAB — AMB POC URINALYSIS DIP STICK AUTO W/O MICRO
Bilirubin (UA POC): NEGATIVE
Blood (UA POC): NEGATIVE
Glucose (UA POC): NEGATIVE
Ketones (UA POC): NEGATIVE
Leukocyte esterase (UA POC): NEGATIVE
Nitrites (UA POC): NEGATIVE
Protein (UA POC): NEGATIVE mg/dL
Specific gravity (UA POC): 1.01 (ref 1.001–1.035)
Urobilinogen (UA POC): 0.2 (ref 0.2–1)
pH (UA POC): 6 (ref 4.6–8.0)

## 2015-06-24 LAB — AMB POC PVR, MEAS,POST-VOID RES,US,NON-IMAGING: PVR: 301 cc

## 2015-06-24 LAB — AMB POC UROFLOWMETRY
AVG FLOW RATE: 12 ml/Seconds
FLOW TIME: 21 Seconds
MAX FLOW RATE: 24 ml/Seconds
TIME TO MAX, POC: 2 Seconds
VOIDED VOLUME: 268 ml
VOIDING TIME: 21 Seconds

## 2015-06-24 NOTE — Progress Notes (Signed)
ASSESSMENT:     ICD-10-CM ICD-9-CM    1. Benign prostatic hyperplasia, presence of lower urinary tract symptoms unspecified, unspecified morphology N40.0 600.00 acetaminophen (TYLENOL) 500 mg tablet      loratadine 10 mg cap      diltiazem XR (DILACOR XR) 120 mg XR capsule      diphenhydrAMINE (BENADRYL) 25 mg capsule      flecainide (TAMBOCOR) 100 mg tablet      apixaban (ELIQUIS) 5 mg tablet      atorvastatin (LIPITOR) 20 mg tablet      AMB POC URINALYSIS DIP STICK AUTO W/O MICRO      AMB POC UROFLOWMETRY      AMB POC PVR, MEAS,POST-VOID RES,US,NON-IMAGING   2. Erectile dysfunction, unspecified erectile dysfunction type N52.9 607.84 acetaminophen (TYLENOL) 500 mg tablet      loratadine 10 mg cap      diltiazem XR (DILACOR XR) 120 mg XR capsule      diphenhydrAMINE (BENADRYL) 25 mg capsule      flecainide (TAMBOCOR) 100 mg tablet      apixaban (ELIQUIS) 5 mg tablet      atorvastatin (LIPITOR) 20 mg tablet      AMB POC URINALYSIS DIP STICK AUTO W/O MICRO      AMB POC UROFLOWMETRY      AMB POC PVR, MEAS,POST-VOID RES,US,NON-IMAGING   3. Nocturia R35.1 788.43 acetaminophen (TYLENOL) 500 mg tablet      loratadine 10 mg cap      diltiazem XR (DILACOR XR) 120 mg XR capsule      diphenhydrAMINE (BENADRYL) 25 mg capsule      flecainide (TAMBOCOR) 100 mg tablet      apixaban (ELIQUIS) 5 mg tablet      atorvastatin (LIPITOR) 20 mg tablet      AMB POC URINALYSIS DIP STICK AUTO W/O MICRO      AMB POC UROFLOWMETRY      AMB POC PVR, MEAS,POST-VOID RES,US,NON-IMAGING   4. Incomplete bladder emptying R33.9 788.21        PLAN:    1. Discussed elevated PVR. Begin trial or Rapaflo 6mg  daily. Samples provided. Will call Angelique Blonder for Rx if beneficial   2. Can resume Cialis 5mg  daily concurrently with Rapaflo when cleared by Cardiologist. This did help with both ED and LUTS.   3. Continue limiting bladder irritants.   5. Follow up in 8-10 weeks with Flow and PVR        Chief Complaint   Patient presents with    ??? Benign Prostatic Hypertrophy     States that he has had a strong urine stream. Does go frequently. Going q3 hours, but states that he drinks a lot of water. Did have ablation done, and states prior he was doing much better, and has started going downhill since.    ??? Incomplete Bladder Emptying     Has changed dietary habits and states that it has helped him. Feels that about 1/2 the time he does empty all the way, but still has urge.   ??? Erectile Dysfunction     Is currently on Cialis, but was put on holiday from it due to heart issues. States that Cialis did work.    ??? Nocturia     Getting up 2xhs     HISTORY OF PRESENT ILLNESS:  Mark Jacobs is a 69 y.o. male with h/o BPH, LUTS, and ED who presents today in follow up. Since last visit he had a cardiac ablation and  was instructed to discontinued Cialis 5mg . Notes that Foley catheter during hospital course irritated his urethra; only inserted for several hours. Cialis was beneficial to both ED and LUTS. He found most benefit to LUTS when he stopped drinking carbonated beverages. PVR 301 cc's.       Initial visit: Presents today for consultation and has been referred by Dr. Gregery Na, MD for evaluation and management of right inguinal pain. Pain has been present for the last 10 years, with intermittence occurrence. Pain is variable in intensity and he does identify food as triggers, specially pork products. Exercise, interestingly does not increase the pain, but on the contrary, it alleviates pain. He admits his life is very sedentary, and this does not help.    He also wants to discus ED symptoms. For the last 10 years has noticed progressive difficulty to attain and obtain penile rigidity, with more progression over last 2 years, and currently has no response to Cialis, and initially was effective.        Has had abdominal surgery for apparent bowel infection (diverticulitis?) and vasectomy with reversal (had children after)    Review of Systems   Constitutional: Fever: No  Skin: Rash: No  HEENT: Hearing difficulty: No  Eyes: Blurred vision: No  Cardiovascular: Chest pain: No  Respiratory: Shortness of breath: No  Gastrointestinal: Nausea/vomiting: No  Musculoskeletal: Back pain: No  Neurological: Weakness: No  Psychological: Memory loss: No  Comments/additional findings:                 Past Medical History   Diagnosis Date   ??? GERD (gastroesophageal reflux disease)    ??? Constipation    ??? Colon polyps    ??? Diverticulosis    ??? Internal hemorrhoids    ??? Elevated BP    ??? RLQ abdominal pain    ??? Diverticulitis    ??? Hearing loss    ??? Tick bite    ??? Tinnitus    ??? Paresthesia    ??? Erectile dysfunction    ??? Lower urinary tract symptoms (LUTS)    ??? Groin pain    ??? Peyronie's disease    ??? Nocturia        Past Surgical History   Procedure Laterality Date   ??? Hx vasectomy  1978     w/ reversal   ??? Hx hernia repair  1978     L inguinal    ??? Hx colectomy  2015   ??? Hx afib ablation  7.7.2016       History   Substance Use Topics   ??? Smoking status: Former Smoker   ??? Smokeless tobacco: Former Neurosurgeon   ??? Alcohol Use: 4.2 oz/week     7 Glasses of wine per week       No Known Allergies    Family History   Problem Relation Age of Onset   ??? Colon Cancer Mother        Current Outpatient Prescriptions   Medication Sig Dispense Refill   ??? acetaminophen (TYLENOL) 500 mg tablet Take  by mouth every six (6) hours as needed for Pain.     ??? loratadine 10 mg cap Take  by mouth.     ??? diltiazem XR (DILACOR XR) 120 mg XR capsule Take  by mouth daily.     ??? diphenhydrAMINE (BENADRYL) 25 mg capsule Take 25 mg by mouth every six (6) hours as needed.     ??? flecainide (TAMBOCOR) 100 mg tablet  Take 100 mg by mouth two (2) times a day.     ??? apixaban (ELIQUIS) 5 mg tablet Take 5 mg by mouth two (2) times a day.     ??? atorvastatin (LIPITOR) 20 mg tablet Take 10 mg by mouth daily.     ??? losartan (COZAAR) 25 mg tablet Take  by mouth daily.      ??? tadalafil (CIALIS) 5 mg tablet Take 1 Tab by mouth daily as needed. 30 Tab 5   ??? tadalafil (CIALIS) 10 mg tablet Take 10 mg by mouth every other day. 45 Tab 3         PHYSICAL EXAMINATION:   BP 160/98 mmHg   Ht 6\' 1"  (1.854 m)   Wt 230 lb (104.327 kg)   BMI 30.35 kg/m2  Constitutional: WDWN, Pleasant and appropriate affect, No acute distress.    CV:  No peripheral swelling noted  Respiratory: No respiratory distress or difficulties  Abdomen:  No abdominal masses or tenderness. No CVA tenderness. No inguinal hernias noted.   GU Male exam from prior visit:    SCROTUM:  No scrotal rash or lesions noticed.  Normal bilateral testes and epididymis.   PENIS: Urethral meatus normal in location and size. No urethral discharge. Has Peyronie's disease plaque, dorsal, subcoronal, non tender, 2 x 2 cm. Reported slight upward curvature that does not preclude intercourse   Skin: No jaundice.    Neuro/Psych:  Alert and oriented x 3, affect appropriate.   Lymphatic:   No enlarged inguinal lymph nodes.        REVIEW OF LABS AND IMAGING:      Results for orders placed or performed in visit on 06/24/15   AMB POC URINALYSIS DIP STICK AUTO W/O MICRO   Result Value Ref Range    Color (UA POC) Yellow     Clarity (UA POC) Clear     Glucose (UA POC) Negative Negative    Bilirubin (UA POC) Negative Negative    Ketones (UA POC) Negative Negative    Specific gravity (UA POC) 1.010 1.001 - 1.035    Blood (UA POC) Negative Negative    pH (UA POC) 6.0 4.6 - 8.0    Protein (UA POC) Negative Negative mg/dL    Urobilinogen (UA POC) 0.2 mg/dL 0.2 - 1    Nitrites (UA POC) Negative Negative    Leukocyte esterase (UA POC) Negative Negative   AMB POC UROFLOWMETRY   Result Value Ref Range    VOIDING TIME 21 Seconds    FLOW TIME 21 Seconds    TIME TO MAX 2 Seconds    MAX FLOW RATE 24 ml/Seconds    AVG FLOW RATE 12 ml/Seconds    VOIDED VOLUME 268 ml   AMB POC PVR, MEAS,POST-VOID RES,US,NON-IMAGING   Result Value Ref Range    PVR 301 cc          A copy of today's office visit with all pertinent imaging results and labs were sent to the referring physician, Dr. Gregery NaMICHELLE M PAULSEN, MD        Scot Dockamon Shadell Brenn, MD on 06/24/2015       Medical documentation provided with the assistance of [facility] [employer], medical scribe to Scot Dockamon Rawlins Stuard, MD.

## 2015-07-02 DIAGNOSIS — I480 Paroxysmal atrial fibrillation: Secondary | ICD-10-CM | POA: Diagnosis not present

## 2015-07-05 MED ORDER — SILODOSIN 8 MG CAPSULE
8 mg | ORAL_CAPSULE | Freq: Every day | ORAL | 3 refills | Status: AC
Start: 2015-07-05 — End: ?

## 2015-07-05 NOTE — Telephone Encounter (Signed)
Patient called and left a message that the rapaflo has helped and would like a prescription sent to his pharmacy

## 2015-07-24 ENCOUNTER — Encounter: Payer: Self-pay | Admitting: Emergency Medicine

## 2015-07-24 ENCOUNTER — Emergency Department
Admission: EM | Admit: 2015-07-24 | Discharge: 2015-07-24 | Disposition: A | Discharge status: Home / Self Care | Payer: Medicare Other | Attending: Emergency Medicine | Admitting: Emergency Medicine

## 2015-07-24 DIAGNOSIS — T45515A Adverse effect of anticoagulants, initial encounter: Secondary | ICD-10-CM | POA: Insufficient documentation

## 2015-07-24 DIAGNOSIS — I499 Cardiac arrhythmia, unspecified: Secondary | ICD-10-CM | POA: Diagnosis not present

## 2015-07-24 DIAGNOSIS — R079 Chest pain, unspecified: Secondary | ICD-10-CM | POA: Diagnosis not present

## 2015-07-24 DIAGNOSIS — R531 Weakness: Secondary | ICD-10-CM

## 2015-07-24 HISTORY — DX: Diverticulitis of intestine, part unspecified, without perforation or abscess without bleeding: K57.92

## 2015-07-24 HISTORY — DX: Unspecified intestinal obstruction, unspecified as to partial versus complete obstruction: K56.609

## 2015-07-24 HISTORY — DX: Reserved for inherently not codable concepts without codable children: IMO0001

## 2015-07-24 HISTORY — DX: Unspecified hearing loss, unspecified ear: H91.90

## 2015-07-24 LAB — CBC
HEMATOCRIT: 47.3 % (ref 40.0–52.0)
Hemoglobin: 15.9 g/dL (ref 13.0–18.0)
MCH: 31.2 pg (ref 26.0–34.0)
MCHC: 33.6 g/dL (ref 32.0–36.0)
MCV: 92.9 fL (ref 80.0–100.0)
PLATELETS: 136 10*3/uL — AB (ref 150–440)
RBC: 5.09 MIL/uL (ref 4.40–5.90)
RDW: 13.8 % (ref 11.5–14.5)
WBC: 5.4 10*3/uL (ref 3.8–10.6)

## 2015-07-24 LAB — BASIC METABOLIC PANEL
Anion gap: 8 (ref 5–15)
BUN: 16 mg/dL (ref 6–20)
CO2: 27 mmol/L (ref 22–32)
Calcium: 9 mg/dL (ref 8.9–10.3)
Chloride: 105 mmol/L (ref 101–111)
Creatinine, Ser: 1.07 mg/dL (ref 0.61–1.24)
GFR calc Af Amer: >60 mL/min (ref >60)
Glucose, Bld: 97 mg/dL (ref 65–99)
POTASSIUM: 4.2 mmol/L (ref 3.5–5.1)
SODIUM: 140 mmol/L (ref 135–145)

## 2015-07-24 LAB — TROPONIN I: Troponin I: <0.03 ng/mL (ref <0.031)

## 2015-07-24 NOTE — ED Notes (Signed)
Pt to ED via EMS from family practice office in [place], pt states he just moved here and went there today to set up a PCP in town, states when he got there he advised them of symptoms he had been having since he had a cardiac ablation for a-fib in July, states he has been just feeling weak and having some dizziness but states these symptoms are not new and he does not feel any different today than he normally does but the MD at the office felt he needed to come to ED due to hx, pt denies any cp or sob

## 2015-07-24 NOTE — Discharge Instructions (Signed)

## 2015-07-24 NOTE — ED Provider Notes (Signed)
[facility] Emergency Department Provider Note     Time seen: ----------------------------------------- 6:13 PM on 07/24/2015 -----------------------------------------    I have reviewed the triage vital signs and the nursing notes.   HISTORY  Chief Complaint Weakness    HPI David Rice is a 69 y.o. male who presents to ER being sent by family practice office and Phillip Heal. Patient states she just moved here with her today to set up a primary care doctor in town. Patient states when he got there he had advised the symptoms he had had since he had a cardiac ablation in July, feeling weak from taking Eliquis. Patient was sent to the ER for evaluation. Denies any other complaints. Patient denies fevers, chills, chest pain or shortness of breath.   Past Medical History  Diagnosis Date  . A-fib   . Bowel obstruction   . Hearing impaired   . Diverticulitis   . Aortic aneurysm     There are no active problems to display for this patient.   Past Surgical History  Procedure Laterality Date  . Cardiac electrophysiology mapping and ablation    . Abdominal surgery      Allergies Review of patient's allergies indicates no known allergies.  Social History Social History  Substance Use Topics  . Smoking status: Never Smoker   . Smokeless tobacco: None  . Alcohol Use: Yes    Review of Systems Constitutional: Negative for fever. Eyes: Negative for visual changes. ENT: Negative for sore throat. Cardiovascular: Negative for chest pain. Respiratory: Negative for shortness of breath. Gastrointestinal: Negative for abdominal pain, vomiting and diarrhea. Genitourinary: Negative for dysuria. Musculoskeletal: Negative for back pain. Skin: Negative for rash. Neurological: Negative for headaches, positive for weakness  10-point ROS otherwise negative.  ____________________________________________   PHYSICAL EXAM:  VITAL SIGNS: ED Triage Vitals   Enc Vitals Group     BP 07/24/15 1606 125/74 mmHg     Pulse Rate 07/24/15 1606 65     Resp 07/24/15 1606 18     Temp 07/24/15 1606 98.1 F (36.7 C)     Temp Source 07/24/15 1606 Oral     SpO2 07/24/15 1606 97 %     Weight 07/24/15 1606 235 lb (106.595 kg)     Height 07/24/15 1606 6\' 2"  (1.88 m)     Head Cir --      Peak Flow --      Pain Score 07/24/15 1743 0     Pain Loc --      Pain Edu? --      Excl. in [place]? --     Constitutional: Alert and oriented. Well appearing and in no distress. Eyes: Conjunctivae are normal. PERRL. Normal extraocular movements. ENT   Head: Normocephalic and atraumatic.   Nose: No congestion/rhinnorhea.   Mouth/Throat: Mucous membranes are moist.   Neck: No stridor. Cardiovascular: Normal rate, regular rhythm. Normal and symmetric distal pulses are present in all extremities. No murmurs, rubs, or gallops. Respiratory: Normal respiratory effort without tachypnea nor retractions. Breath sounds are clear and equal bilaterally. No wheezes/rales/rhonchi. Gastrointestinal: Soft and nontender. No distention. No abdominal bruits.  Musculoskeletal: Nontender with normal range of motion in all extremities. No joint effusions.  No lower extremity tenderness nor edema. Neurologic:  Normal speech and language. No gross focal neurologic deficits are appreciated. Speech is normal. No gait instability. Skin:  Skin is warm, dry and intact. No rash noted. Psychiatric: Mood and affect are normal. Speech and behavior are normal. Patient  exhibits appropriate insight and judgment. ____________________________________________  EKG: Interpreted by me. Sinus rhythm with first-degree AV block, rate 63 bpm, likely septal infarct age indeterminate. Normal QRS with, normal QT interval.  ____________________________________________  ED COURSE:  Pertinent labs & imaging results that were available during my care of the patient were reviewed by me and considered in my  medical decision making (see chart for details). Patient is in no acute distress, will check basic labs and reevaluate ____________________________________________    LABS (pertinent positives/negatives)  Labs Reviewed  CBC - Abnormal; Notable for the following:    Platelets 136 (*)    All other components within normal limits  BASIC METABOLIC PANEL  TROPONIN I    ____________________________________________  FINAL ASSESSMENT AND PLAN  Weakness  Plan: Patient with labs and imaging as dictated above. Patient was sent to the ER due to concerning past medical history. No acute emergency medical condition is identified this time. He'll be referred to [facility] for follow-up.   Earleen Newport, MD   Earleen Newport, MD 07/24/15 [phone]

## 2015-07-28 ENCOUNTER — Emergency Department
Admission: EM | Admit: 2015-07-28 | Discharge: 2015-07-28 | Disposition: A | Discharge status: Home / Self Care | Payer: Medicare Other | Attending: Emergency Medicine | Admitting: Emergency Medicine

## 2015-07-28 ENCOUNTER — Encounter: Payer: Self-pay | Admitting: Emergency Medicine

## 2015-07-28 ENCOUNTER — Emergency Department: Payer: Medicare Other

## 2015-07-28 DIAGNOSIS — R079 Chest pain, unspecified: Secondary | ICD-10-CM

## 2015-07-28 DIAGNOSIS — I712 Thoracic aortic aneurysm, without rupture: Secondary | ICD-10-CM

## 2015-07-28 DIAGNOSIS — I10 Essential (primary) hypertension: Secondary | ICD-10-CM | POA: Insufficient documentation

## 2015-07-28 DIAGNOSIS — I724 Aneurysm of artery of lower extremity: Secondary | ICD-10-CM | POA: Diagnosis not present

## 2015-07-28 DIAGNOSIS — M549 Dorsalgia, unspecified: Secondary | ICD-10-CM

## 2015-07-28 DIAGNOSIS — I714 Abdominal aortic aneurysm, without rupture, unspecified: Secondary | ICD-10-CM

## 2015-07-28 DIAGNOSIS — I7121 Aneurysm of the ascending aorta, without rupture: Secondary | ICD-10-CM

## 2015-07-28 HISTORY — DX: Essential (primary) hypertension: I10

## 2015-07-28 LAB — BASIC METABOLIC PANEL
ANION GAP: 9 (ref 5–15)
BUN: 19 mg/dL (ref 6–20)
CALCIUM: 9.1 mg/dL (ref 8.9–10.3)
CO2: 26 mmol/L (ref 22–32)
Chloride: 103 mmol/L (ref 101–111)
Creatinine, Ser: 1.13 mg/dL (ref 0.61–1.24)
GLUCOSE: 98 mg/dL (ref 65–99)
POTASSIUM: 4.1 mmol/L (ref 3.5–5.1)
SODIUM: 138 mmol/L (ref 135–145)

## 2015-07-28 LAB — CBC
HEMATOCRIT: 47.2 % (ref 40.0–52.0)
HEMOGLOBIN: 15.8 g/dL (ref 13.0–18.0)
MCH: 31.2 pg (ref 26.0–34.0)
MCHC: 33.5 g/dL (ref 32.0–36.0)
MCV: 93.1 fL (ref 80.0–100.0)
Platelets: 148 10*3/uL — ABNORMAL LOW (ref 150–440)
RBC: 5.07 MIL/uL (ref 4.40–5.90)
RDW: 13.6 % (ref 11.5–14.5)
WBC: 5.8 10*3/uL (ref 3.8–10.6)

## 2015-07-28 LAB — TROPONIN I

## 2015-07-28 MED ORDER — IOHEXOL 350 MG/ML SOLN
125.0000 mL | Freq: Once | INTRAVENOUS | Status: AC | PRN
  Administered 2015-07-28: 125 mL via INTRAVENOUS

## 2015-07-28 NOTE — ED Provider Notes (Signed)
[facility] Emergency Department Provider Note   ____________________________________________  Time seen: 4:45 PM I have reviewed the triage vital signs and the triage nursing note.  HISTORY  Chief Complaint Chest Pain   Historian Patient  HPI David Rice is a 69 y.o. male who reports a history of aortic aneurysm near the heart, with no specific 6, history of A. fib status post ablation a few months ago, who has been experiencing some dyspnea on exertion for several weeks, and then today had a severe episode of chest pain around 245. Apparently he's been having some intermittent dull chest pains recently including this morning, however after he took a nap this afternoon at rest he experienced a severe central chest pain that went through to his back and lasted several minutes before easing off on its own. He was short of breath at that point in time. No syncope. He reports that just prior to the episode he woke up from his nap and felt like he could not move either arm and either leg. There is no numbness. As he laid there he then was able to move both his arms and legs. He reports waking up this morning and feeling nauseated and "bad."  [place], and has been here only a few months, and is just in the process of establishing primary care with Dr. Netty Starring and cardiology with Dr. Rockey Situ.    Past Medical History  Diagnosis Date  . A-fib   . Bowel obstruction   . Hearing impaired   . Diverticulitis   . Aortic aneurysm   . Hypertension     There are no active problems to display for this patient.   Past Surgical History  Procedure Laterality Date  . Cardiac electrophysiology mapping and ablation    . Abdominal surgery      No current outpatient prescriptions on file. Eloquis  Allergies Review of patient's allergies indicates no known allergies.  History reviewed. No pertinent family history.  Social  History Social History  Substance Use Topics  . Smoking status: Never Smoker   . Smokeless tobacco: None  . Alcohol Use: Yes   retired Optometrist  Review of Systems  Constitutional: Negative for fever. Eyes: Negative for visual changes. ENT: Negative for sore throat. Cardiovascular: Negative for palpitations Respiratory: Negative for cough Gastrointestinal: Negative for abdominal pain, vomiting and diarrhea. Genitourinary: Negative for dysuria. Musculoskeletal: Positive for central back pain with the chest pain episode, none now. Skin: Negative for rash. Neurological: Negative for headache. 10 point Review of Systems otherwise negative ____________________________________________   PHYSICAL EXAM:  VITAL SIGNS: ED Triage Vitals  Enc Vitals Group     BP 07/28/15 1530 141/83 mmHg     Pulse Rate 07/28/15 1530 72     Resp 07/28/15 1530 16     Temp --      Temp src --      SpO2 07/28/15 1530 99 %     Weight --      Height --      Head Cir --      Peak Flow --      Pain Score 07/28/15 1440 4     Pain Loc --      Pain Edu? --      Excl. in [place]? --      Constitutional: Alert and oriented. Well appearing and in no distress. Eyes: Conjunctivae are normal. PERRL. Normal extraocular movements. ENT   Head: Normocephalic and  atraumatic.   Nose: No congestion/rhinnorhea.   Mouth/Throat: Mucous membranes are moist.   Neck: No stridor. Cardiovascular/Chest: Normal rate, regular rhythm.  No murmurs, rubs, or gallops. Respiratory: Normal respiratory effort without tachypnea nor retractions. Breath sounds are clear and equal bilaterally. No wheezes/rales/rhonchi. Gastrointestinal: Soft. No distention, no guarding, no rebound. Mildly obese. Mild tenderness to palpation in the mid abdomen without ability to palpate a pulsatile mass. Genitourinary/rectal:Deferred Musculoskeletal: Nontender with normal range of motion in all extremities. No joint effusions.  No lower  extremity tenderness.  No edema. Neurologic:  Normal speech and language. No gross or focal neurologic deficits are appreciated. 5 out of 5 strength in 4 extremities. Skin:  Skin is warm, dry and intact. No rash noted. Psychiatric: Mood and affect are normal. Speech and behavior are normal. Patient exhibits appropriate insight and judgment.  ____________________________________________   EKG I, Lisa Roca, MD, the attending physician have personally viewed and interpreted all ECGs.  69 bpm. Sinus rhythm with premature supraventricular beats. Narrow QRS. Normal axis. Q waves septally. Normal ST and T-wave. ____________________________________________  LABS (pertinent positives/negatives)  Basic metabolic panel without abnormality CBC without abnormality other than platelet 148  troponin less than 0.03 Repeat troponin: Less than 0.03  ____________________________________________  RADIOLOGY All Xrays were viewed by me. Imaging interpreted by Radiologist. I discussed the results with the radiologist.  Chest x-ray two-view: Negative   CT angios chest abdomen pelvis for evaluation of the aorta:   IMPRESSION: No evidence of thoracic aortic dissection.  4.8 cm ascending thoracic aortic aneurysm is noted. Ascending thoracic aortic aneurysm. Recommend semi-annual imaging followup by CTA or MRA and referral to cardiothoracic surgery if not already obtained. This recommendation follows 2010 ACCF/AHA/AATS/ACR/ASA/SCA/SCAI/SIR/STS/SVM Guidelines for the Diagnosis and Management of Patients With Thoracic Aortic Disease. Circulation. 2010; 121: X106-Y694.  No evidence of abdominal aortic dissection. 3.1 cm infrarenal abdominal aortic aneurysm is noted.  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 There appears to be early filling of the left common femoral vein with contrast, most likely due to this pseudoaneurysm. Consultation  with vascular surgery is recommended. Critical Value/emergent results were called by telephone at the time of interpretation on 07/28/2015 at 6:11 pm to Dr. Lisa Roca , who verbally acknowledged these results. __________________________________________  PROCEDURES  Procedure(s) performed: None  Critical Care performed: None  ____________________________________________   ED COURSE / ASSESSMENT AND PLAN  CONSULTATIONS: Dr. Ronalee Belts, Vascular Surgery - recommends patient call the office in the morning, for an appointment tomorrow.  Pertinent labs & imaging results that were available during my care of the patient were reviewed by me and considered in my medical decision making (see chart for details).   Patient's currently chest pain-free, however he had at least 2 episodes which were fairly significant to him, however without any associated symptoms. Given his history that he has had an aortic aneurysm, I do feel like it is important to rule out any complication of the aortic aneurysm with a chest CT I discussed this with the patient.  In terms of ACS, I feel like it is unlikely, with a reassuring EKG and troponins including a every 4 hours troponin. He should follow-up with a cardiologist within 1-2 days.  I discussed the CT results with the radiologist, with regard to the very large left groin/femoral artery aneurysm, I reevaluated the patient who has no symptoms related to this at present. I discussed with the vascular surgeon who is reclining he follow-up  tomorrow in the office.  Patient / Family / Caregiver informed of clinical course, medical decision-making process, and agree with plan.   I discussed return precautions, follow-up instructions, and discharged instructions with patient and/or family.  ___________________________________________   FINAL CLINICAL IMPRESSION(S) / ED DIAGNOSES   Final diagnoses:  Back pain  Chest pain, unspecified chest pain type  Ascending  aortic aneurysm  Abdominal aortic aneurysm  Femoral artery pseudo-aneurysm, left       Lisa Roca, MD 07/28/15 2038

## 2015-07-28 NOTE — Discharge Instructions (Signed)
You were evaluated for chest pain, no certain cause was found, however your exam and evaluation are reassuring tonight. Your CT scan did confirm the known ascending aortic aneurysm, as well as a small aneurysm of the abdominal portion of the aorta treated these will need continued surveillance. 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 Call Dr. Nino Parsley office tomorrow, vascular surgery, for an appointment tomorrow.  You also need to see a cardiologist in 1-2 days, call Dr. Donivan Scull office tomorrow to make the appointment.  Return to the emergency department immediately for any new or worsening condition congestion, breathing, weakness, numbness, dizziness, passing out, or any other symptoms concerning to you.   Abdominal Aortic Aneurysm An aneurysm is a weakened or damaged part of an artery wall that bulges from the normal force of blood pumping through the body. An abdominal aortic aneurysm is an aneurysm that occurs in the lower part of the aorta, the main artery of the body.  The major concern with an abdominal aortic aneurysm is that it can enlarge and burst (rupture) or blood can flow between the layers of the wall of the aorta through a tear (aorticdissection). Both of these conditions can cause bleeding inside the body and can be life threatening unless diagnosed and treated promptly. CAUSES  The exact cause of an abdominal aortic aneurysm is unknown. Some contributing factors are:   A hardening of the arteries caused by the buildup of fat and other substances in the lining of a blood vessel (arteriosclerosis).  Inflammation of the walls of an artery (arteritis).   Connective tissue diseases, such as Marfan syndrome.   Abdominal trauma.   An infection, such as syphilis or staphylococcus, in the wall of the aorta (infectious aortitis) caused by bacteria. RISK FACTORS  Risk factors that contribute to an abdominal aortic aneurysm may  include:  Age older than 49 years.   High blood pressure (hypertension).  Male gender.  Ethnicity (white race).  Obesity.  Family history of aneurysm (first degree relatives only).  Tobacco use. PREVENTION  The following healthy lifestyle habits may help decrease your risk of abdominal aortic aneurysm:  Quitting smoking. Smoking can raise your blood pressure and cause arteriosclerosis.  Limiting or avoiding alcohol.  Keeping your blood pressure, blood sugar level, and cholesterol levels within normal limits.  Decreasing your salt intake. In somepeople, too much salt can raise blood pressure and increase your risk of abdominal aortic aneurysm.  Eating a diet low in saturated fats and cholesterol.  Increasing your fiber intake by including whole grains, vegetables, and fruits in your diet. Eating these foods may help lower blood pressure.  Maintaining a healthy weight.  Staying physically active and exercising regularly. SYMPTOMS  The symptoms of abdominal aortic aneurysm may vary depending on the size and rate of growth of the aneurysm.Most grow slowly and do not have any symptoms. When symptoms do occur, they may include:  Pain (abdomen, side, lower back, or groin). The pain may vary in intensity. A sudden onset of severe pain may indicate that the aneurysm has ruptured.  Feeling full after eating only small amounts of food.  Nausea or vomiting or both.  Feeling a pulsating lump in the abdomen.  Feeling faint or passing out. DIAGNOSIS  Since most unruptured abdominal aortic aneurysms have no symptoms, they are often discovered during diagnostic exams for other conditions. An aneurysm may be found during the following procedures:  Ultrasonography (A one-time screening for  abdominal aortic aneurysm by ultrasonography is also recommended for all men aged 1-75 years who have ever smoked).  X-ray exams.  A computed tomography (CT).  Magnetic resonance imaging  (MRI).  Angiography or arteriography. TREATMENT  Treatment of an abdominal aortic aneurysm depends on the size of your aneurysm, your age, and risk factors for rupture. Medication to control blood pressure and pain may be used to manage aneurysms smaller than 6 cm. Regular monitoring for enlargement may be recommended by your caregiver if:  The aneurysm is 3-4 cm in size (an annual ultrasonography may be recommended).  The aneurysm is 4-4.5 cm in size (an ultrasonography every 6 months may be recommended).  The aneurysm is larger than 4.5 cm in size (your caregiver may ask that you be examined by a vascular surgeon). If your aneurysm is larger than 6 cm, surgical repair may be recommended. There are two main methods for repair of an aneurysm:   Endovascular repair (a minimally invasive surgery). This is done most often.  Open repair. This method is used if an endovascular repair is not possible. Document Released: 08/12/2005 Document Revised: 02/27/2013 Document Reviewed: 12/02/2012 [facility] Patient Information 2015 [place], [place]. This information is not intended to replace advice given to you by your health care provider. Make sure you discuss any questions you have with your health care provider.  Chest Pain (Nonspecific) It is often hard to give a diagnosis for the cause of chest pain. There is always a chance that your pain could be related to something serious, such as a heart attack or a blood clot in the lungs. You need to follow up with your doctor. HOME CARE  If antibiotic medicine was given, take it as directed by your doctor. Finish the medicine even if you start to feel better.  For the next few days, avoid activities that bring on chest pain. Continue physical activities as told by your doctor.  Do not use any tobacco products. This includes cigarettes, chewing tobacco, and e-cigarettes.  Avoid drinking alcohol.  Only take medicine as told by your doctor.  Follow your  doctor's suggestions for more testing if your chest pain does not go away.  Keep all doctor visits you made. GET HELP IF:  Your chest pain does not go away, even after treatment.  You have a rash with blisters on your chest.  You have a fever. GET HELP RIGHT AWAY IF:   You have more pain or pain that spreads to your arm, neck, jaw, back, or belly (abdomen).  You have shortness of breath.  You cough more than usual or cough up blood.  You have very bad back or belly pain.  You feel sick to your stomach (nauseous) or throw up (vomit).  You have very bad weakness.  You pass out (faint).  You have chills. This is an emergency. Do not wait to see if the problems will go away. Call your local emergency services (911 in [place] Do not drive yourself to the hospital. MAKE SURE YOU:   Understand these instructions.  Will watch your condition.  Will get help right away if you are not doing well or get worse. Document Released: 04/20/2008 Document Revised: 11/07/2013 Document Reviewed: 04/20/2008 [facility] Patient Information 2015 [place], [place]. This information is not intended to replace advice given to you by your health care provider. Make sure you discuss any questions you have with your health care provider.

## 2015-07-28 NOTE — ED Notes (Signed)
Patient c/o pain to left chest that began at 7 am today.  Also had pain between shoulder blades.  Denies nausea or vomiting. Unsteady on feet.  Has aortic aneurysm.  Has been unsteady on feet.  Also has hx afib.

## 2015-07-29 DIAGNOSIS — I724 Aneurysm of artery of lower extremity: Secondary | ICD-10-CM | POA: Diagnosis not present

## 2015-07-29 DIAGNOSIS — I10 Essential (primary) hypertension: Secondary | ICD-10-CM | POA: Diagnosis not present

## 2015-07-29 DIAGNOSIS — E785 Hyperlipidemia, unspecified: Secondary | ICD-10-CM | POA: Diagnosis not present

## 2015-07-30 ENCOUNTER — Other Ambulatory Visit: Payer: Self-pay | Admitting: Vascular Surgery

## 2015-08-01 ENCOUNTER — Ambulatory Visit (INDEPENDENT_AMBULATORY_CARE_PROVIDER_SITE_OTHER): Payer: Medicare Other | Admitting: Cardiovascular Disease

## 2015-08-01 ENCOUNTER — Encounter: Payer: Self-pay | Admitting: Cardiovascular Disease

## 2015-08-01 VITALS — BP 140/78 | HR 64 | Ht 74.0 in | Wt 237.2 lb

## 2015-08-01 DIAGNOSIS — I4819 Other persistent atrial fibrillation: Secondary | ICD-10-CM

## 2015-08-01 DIAGNOSIS — I4891 Unspecified atrial fibrillation: Secondary | ICD-10-CM

## 2015-08-01 DIAGNOSIS — I712 Thoracic aortic aneurysm, without rupture, unspecified: Secondary | ICD-10-CM

## 2015-08-01 DIAGNOSIS — I10 Essential (primary) hypertension: Secondary | ICD-10-CM

## 2015-08-01 DIAGNOSIS — R531 Weakness: Secondary | ICD-10-CM

## 2015-08-01 DIAGNOSIS — I724 Aneurysm of artery of lower extremity: Secondary | ICD-10-CM

## 2015-08-01 DIAGNOSIS — I481 Persistent atrial fibrillation: Secondary | ICD-10-CM | POA: Diagnosis not present

## 2015-08-01 DIAGNOSIS — R079 Chest pain, unspecified: Secondary | ICD-10-CM

## 2015-08-01 DIAGNOSIS — E785 Hyperlipidemia, unspecified: Secondary | ICD-10-CM

## 2015-08-01 DIAGNOSIS — I7121 Aneurysm of the ascending aorta, without rupture: Secondary | ICD-10-CM | POA: Insufficient documentation

## 2015-08-01 NOTE — Assessment & Plan Note (Signed)
This measured 4.8 cm on recent CT scan and should be followed every 6 months.

## 2015-08-01 NOTE — Assessment & Plan Note (Signed)
He is currently on atorvastatin.

## 2015-08-01 NOTE — Progress Notes (Signed)
HPI  This is a pleasant 69 year old male who is here today to establish cardiovascular care. He moved from [place]. He reports being diagnosed with atrial fibrillation in December 2015. He was initially treated with atenolol and flecainide but did not tolerate the medications due to extreme fatigue. He ultimately underwent ablation in July of this year by Dr. Josephine Igo. He reports improvement in palpitations since then. He has been on anticoagulation with Eliquis which is currently on hold for an invasive vascular procedure tomorrow. He has known history of hypertension and hyperlipidemia. He reports no previous history of coronary artery disease with a treadmill nuclear stress test in December 2015 which was normal. There is no history of stroke or congestive heart failure. He is a lifelong nonsmoker and drinks occasional wine. He reports localized chest pain with tenderness to touch. He has been mostly concerned about fatigue which he attributes it to Eliquis. He went to the emergency room with these symptoms and had negative basic cardiac workup. He had incidental finding of left common femoral artery pseudoaneurysm likely related to his recent ablation procedure in July which required bilateral femoral access. He reports intermittent palpitations almost on a daily basis especially with eating. He denies chest tightness.  No Known Allergies   No current outpatient prescriptions on file prior to visit.   No current facility-administered medications on file prior to visit.     Past Medical History  Diagnosis Date  . A-fib   . Bowel obstruction   . Hearing impaired   . Diverticulitis   . Hypertension   . Thoracic aortic aneurysm without rupture   . Sleep apnea   . AAA (abdominal aortic aneurysm)   . Essential hypertension   . Hyperlipidemia   . Persistent atrial fibrillation     Status post ablation in July 2016 in [place].     Past Surgical History  Procedure Laterality Date  .  Cardiac electrophysiology mapping and ablation    . Abdominal surgery    . Cardiac catheterization  11/2014    [place]     Family History  Problem Relation Age of Onset  . Family history unknown: Yes     Social History   Social History  . Marital Status: Single    Spouse Name: N/A  . Number of Children: N/A  . Years of Education: N/A   Occupational History  . Not on file.   Social History Main Topics  . Smoking status: Never Smoker   . Smokeless tobacco: Not on file  . Alcohol Use: 1.2 oz/week    2 Glasses of wine per week  . Drug Use: No  . Sexual Activity: Not on file   Other Topics Concern  . Not on file   Social History Narrative     ROS A 10 point review of system was performed. It is negative other than that mentioned in the history of present illness.   PHYSICAL EXAM   BP 140/78 mmHg  Pulse 64  Ht 6\' 2"  (1.88 m)  Wt 237 lb 4 oz (107.616 kg)  BMI 30.45 kg/m2 Constitutional: He is oriented to person, place, and time. He appears well-developed and well-nourished. No distress.  HENT: No nasal discharge.  Head: Normocephalic and atraumatic.  Eyes: Pupils are equal and round.  No discharge. Neck: Normal range of motion. Neck supple. No JVD present. No thyromegaly present.  Cardiovascular: Normal rate, regular rhythm, normal heart sounds. Exam reveals no gallop and no friction rub. No  murmur heard.  Pulmonary/Chest: Effort normal and breath sounds normal. No stridor. No respiratory distress. He has no wheezes. He has no rales. He exhibits no tenderness.  Abdominal: Soft. Bowel sounds are normal. He exhibits no distension. There is no tenderness. There is no rebound and no guarding.  Musculoskeletal: Normal range of motion. He exhibits no edema and no tenderness.  Neurological: He is alert and oriented to person, place, and time. Coordination normal.  Skin: Skin is warm and dry. No rash noted. He is not diaphoretic. No erythema. No  pallor.  Psychiatric: He has a normal mood and affect. His behavior is normal. Judgment and thought content normal.       EKG: Normal sinus rhythm with no significant ST or T wave changes.   ASSESSMENT AND PLAN

## 2015-08-01 NOTE — Assessment & Plan Note (Signed)
The patient had an ablation procedure done 2 months ago. He continues to complain of intermittent palpitations almost on a daily basis. thus, I requested a 48-hour Holter monitor. Given his symptoms of fatigue and atypical chest pain, I requested an echocardiogram. The symptoms are not suggestive of an ischemic etiology and he had a nuclear stress test done in December 2015 which was negative.  Anticoagulation is currently on hold for lower extremity angiography to be done tomorrow.  He does not want to go back on Eliquis if possible. He reports extreme fatigue with a medication.  CHADS VASC score is 2.thus, I do recommend anticoagulation. I will probably use a different agent.

## 2015-08-01 NOTE — Assessment & Plan Note (Signed)
Blood pressure is controlled on current medications. 

## 2015-08-01 NOTE — Assessment & Plan Note (Signed)
This was large in size at 4 cm on recent CT scan. He is having angiography done tomorrow by Dr.Schnier. I recommend holding anticoagulation for now until this issue is treated.

## 2015-08-01 NOTE — Patient Instructions (Signed)
Medication Instructions:  Your physician recommends that you continue on your current medications as directed. Please refer to the Current Medication list given to you today.   Labwork: none  Testing/Procedures: Your physician has requested that you have an echocardiogram. Echocardiography is a painless test that uses sound waves to create images of your heart. It provides your doctor with information about the size and shape of your heart and how well your heart's chambers and valves are working. This procedure takes approximately one hour. There are no restrictions for this procedure.  Your physician has recommended that you wear a holter monitor. Holter monitors are medical devices that record the heart's electrical activity. Doctors most often use these monitors to diagnose arrhythmias. Arrhythmias are problems with the speed or rhythm of the heartbeat. The monitor is a small, portable device. You can wear one while you do your normal daily activities. This is usually used to diagnose what is causing palpitations/syncope (passing out).    Follow-Up: Your physician recommends that you schedule a follow-up appointment in: one month with Dr. Fletcher Anon.    Any Other Special Instructions Will Be Listed Below (If Applicable).  Echocardiogram An echocardiogram, or echocardiography, uses sound waves (ultrasound) to produce an image of your heart. The echocardiogram is simple, painless, obtained within a short period of time, and offers valuable information to your health care provider. The images from an echocardiogram can provide information such as:  Evidence of coronary artery disease (CAD).  Heart size.  Heart muscle function.  Heart valve function.  Aneurysm detection.  Evidence of a past heart attack.  Fluid buildup around the heart.  Heart muscle thickening.  Assess heart valve function. LET [facility] CARE PROVIDER KNOW ABOUT:  Any allergies you have.  All medicines you  are taking, including vitamins, herbs, eye drops, creams, and over-the-counter medicines.  Previous problems you or members of your family have had with the use of anesthetics.  Any blood disorders you have.  Previous surgeries you have had.  Medical conditions you have.  Possibility of pregnancy, if this applies. BEFORE THE PROCEDURE  No special preparation is needed. Eat and drink normally.  PROCEDURE   In order to produce an image of your heart, gel will be applied to your chest and a wand-like tool (transducer) will be moved over your chest. The gel will help transmit the sound waves from the transducer. The sound waves will harmlessly bounce off your heart to allow the heart images to be captured in real-time motion. These images will then be recorded.  You may need an IV to receive a medicine that improves the quality of the pictures. AFTER THE PROCEDURE You may return to your normal schedule including diet, activities, and medicines, unless your health care provider tells you otherwise. Document Released: 10/30/2000 Document Revised: 03/19/2014 Document Reviewed: 07/10/2013 [facility] Patient Information 2015 [place], [place]. This information is not intended to replace advice given to you by your health care provider. Make sure you discuss any questions you have with your health care provider. Cardiac Event Monitoring A cardiac event monitor is a small recording device used to help detect abnormal heart rhythms (arrhythmias). The monitor is used to record heart rhythm when noticeable symptoms such as the following occur:  Fast heartbeats (palpitations), such as heart racing or fluttering.  Dizziness.  Fainting or light-headedness.  Unexplained weakness. The monitor is wired to two electrodes placed on your chest. Electrodes are flat, sticky disks that attach to your skin. The monitor can  be worn for up to 30 days. You will wear the monitor at all times, except when bathing.  HOW  TO USE YOUR CARDIAC EVENT MONITOR A technician will prepare your chest for the electrode placement. The technician will show you how to place the electrodes, how to work the monitor, and how to replace the batteries. Take time to practice using the monitor before you leave the office. Make sure you understand how to send the information from the monitor to your health care provider. This requires a telephone with a landline, not a cell phone. You need to:  Wear your monitor at all times, except when you are in water:  Do not get the monitor wet.  Take the monitor off when bathing. Do not swim or use a hot tub with it on.  Keep your skin clean. Do not put body lotion or moisturizer on your chest.  Change the electrodes daily or any time they stop sticking to your skin. You might need to use tape to keep them on.  It is possible that your skin under the electrodes could become irritated. To keep this from happening, try to put the electrodes in slightly different places on your chest. However, they must remain in the area under your left breast and in the upper right section of your chest.  Make sure the monitor is safely clipped to your clothing or in a location close to your body that your health care provider recommends.  Press the button to record when you feel symptoms of heart trouble, such as dizziness, weakness, light-headedness, palpitations, thumping, shortness of breath, unexplained weakness, or a fluttering or racing heart. The monitor is always on and records what happened slightly before you pressed the button, so do not worry about being too late to get good information.  Keep a diary of your activities, such as walking, doing chores, and taking medicine. It is especially important to note what you were doing when you pushed the button to record your symptoms. This will help your health care provider determine what might be contributing to your symptoms. The information stored in your  monitor will be reviewed by your health care provider alongside your diary entries.  Send the recorded information as recommended by your health care provider. It is important to understand that it will take some time for your health care provider to process the results.  Change the batteries as recommended by your health care provider. SEEK IMMEDIATE MEDICAL CARE IF:   You have chest pain.  You have extreme difficulty breathing or shortness of breath.  You develop a very fast heartbeat that persists.  You develop dizziness that does not go away.  You faint or constantly feel you are about to faint. Document Released: 08/11/2008 Document Revised: 03/19/2014 Document Reviewed: 05/01/2013 [facility] Patient Information 2015 [place], [place]. This information is not intended to replace advice given to you by your health care provider. Make sure you discuss any questions you have with your health care provider.

## 2015-08-02 ENCOUNTER — Ambulatory Visit
Admission: RE | Admit: 2015-08-02 | Discharge: 2015-08-02 | Disposition: A | Discharge status: Home / Self Care | Payer: Medicare Other | Source: Ambulatory Visit | Attending: Vascular Surgery | Admitting: Vascular Surgery

## 2015-08-02 ENCOUNTER — Encounter: Payer: Self-pay | Admitting: *Deleted

## 2015-08-02 ENCOUNTER — Encounter
Admission: RE | Disposition: A | Discharge status: Home / Self Care | Payer: Self-pay | Source: Ambulatory Visit | Attending: Vascular Surgery

## 2015-08-02 DIAGNOSIS — Z79899 Other long term (current) drug therapy: Secondary | ICD-10-CM | POA: Diagnosis not present

## 2015-08-02 DIAGNOSIS — I499 Cardiac arrhythmia, unspecified: Secondary | ICD-10-CM | POA: Insufficient documentation

## 2015-08-02 DIAGNOSIS — E780 Pure hypercholesterolemia: Secondary | ICD-10-CM | POA: Diagnosis not present

## 2015-08-02 DIAGNOSIS — I10 Essential (primary) hypertension: Secondary | ICD-10-CM | POA: Diagnosis not present

## 2015-08-02 DIAGNOSIS — I724 Aneurysm of artery of lower extremity: Secondary | ICD-10-CM | POA: Insufficient documentation

## 2015-08-02 HISTORY — PX: PERIPHERAL VASCULAR CATHETERIZATION: SHX172C

## 2015-08-02 SURGERY — ABDOMINAL AORTOGRAM W/LOWER EXTREMITY
Wound class: Clean

## 2015-08-02 MED ORDER — FENTANYL CITRATE (PF) 100 MCG/2ML IJ SOLN
INTRAMUSCULAR | Status: DC | PRN
  Administered 2015-08-02: 25 ug via INTRAVENOUS
  Administered 2015-08-02 (×2): 50 ug via INTRAVENOUS

## 2015-08-02 MED ORDER — CHLORHEXIDINE GLUCONATE CLOTH 2 % EX PADS: 6.0000 | MEDICATED_PAD | Freq: Once | CUTANEOUS | Status: DC

## 2015-08-02 MED ORDER — LIDOCAINE HCL (PF) 1 % IJ SOLN
INTRAMUSCULAR | Status: AC
  Filled 2015-08-02: qty 10

## 2015-08-02 MED ORDER — DEXTROSE 5 % IV SOLN
INTRAVENOUS | Status: AC
  Filled 2015-08-02: qty 1.5

## 2015-08-02 MED ORDER — HEPARIN (PORCINE) IN NACL 2-0.9 UNIT/ML-% IJ SOLN
INTRAMUSCULAR | Status: AC
  Filled 2015-08-02: qty 1000

## 2015-08-02 MED ORDER — FENTANYL CITRATE (PF) 100 MCG/2ML IJ SOLN
INTRAMUSCULAR | Status: AC
  Filled 2015-08-02: qty 2

## 2015-08-02 MED ORDER — FENTANYL CITRATE (PF) 100 MCG/2ML IJ SOLN
INTRAMUSCULAR | Status: AC
Start: 2015-08-02 — End: 2015-08-02
  Filled 2015-08-02: qty 2

## 2015-08-02 MED ORDER — SODIUM CHLORIDE 0.9 % IV SOLN
INTRAVENOUS | Status: DC
Start: 2015-08-02 — End: 2015-08-02
  Administered 2015-08-02: 12:00:00 via INTRAVENOUS

## 2015-08-02 MED ORDER — IOHEXOL 300 MG/ML  SOLN
INTRAMUSCULAR | Status: DC | PRN
  Administered 2015-08-02: 75 mL via INTRA_ARTICULAR

## 2015-08-02 MED ORDER — DEXTROSE 5 % IV SOLN
1.5000 g | INTRAVENOUS | Status: AC
  Administered 2015-08-02: 1.5 g via INTRAVENOUS

## 2015-08-02 MED ORDER — MIDAZOLAM HCL 5 MG/5ML IJ SOLN
INTRAMUSCULAR | Status: AC
  Filled 2015-08-02: qty 5

## 2015-08-02 MED ORDER — MIDAZOLAM HCL 2 MG/2ML IJ SOLN
INTRAMUSCULAR | Status: DC | PRN
  Administered 2015-08-02: 2 mg via INTRAVENOUS
  Administered 2015-08-02 (×2): 1 mg via INTRAVENOUS

## 2015-08-02 MED ORDER — HYDROCODONE-ACETAMINOPHEN 5-325 MG PO TABS: 1.0000 | ORAL_TABLET | Freq: Four times a day (QID) | ORAL | Status: DC | PRN

## 2015-08-02 MED ORDER — HEPARIN SODIUM (PORCINE) 1000 UNIT/ML IJ SOLN
INTRAMUSCULAR | Status: AC
  Filled 2015-08-02: qty 1

## 2015-08-02 SURGICAL SUPPLY — 22 items
CATH BEACON 5.038 65CM KMP-01 (CATHETERS) ×5 IMPLANT
CATH C2 65CM (CATHETERS) ×5 IMPLANT
CATH CXI 4F 90 DAV (MICROCATHETER) ×5 IMPLANT
CATH CXI SUPP ST 2.6FR 150CM (MICROCATHETER) ×5 IMPLANT
CATH MICROCATH PRGRT 2.8F 110 (CATHETERS) ×3 IMPLANT
CATH ROYAL FLUSH PIG 5F 70CM (CATHETERS) ×5 IMPLANT
COIL 2D IDC .035 8MMX10CM (Vascular Products) ×5 IMPLANT
COIL IDC 2D .035 4MMX10CM (Vascular Products) IMPLANT
COIL IDC 2D .035 8MMX20CM (Vascular Products) ×5 IMPLANT
DEVICE STARCLOSE SE CLOSURE (Vascular Products) ×5 IMPLANT
GLIDEWIRE ANGLED SS 035X260CM (WIRE) ×5 IMPLANT
GUIDEWIRE ANGLED .035 180CM (WIRE) ×5 IMPLANT
GUIDEWIRE GOLD .018X300 (WIRE) ×5 IMPLANT
MICROCATH PROGREAT 2.8F 110 CM (CATHETERS) ×5
PACK ANGIOGRAPHY (CUSTOM PROCEDURE TRAY) ×5 IMPLANT
SET INTRO CAPELLA COAXIAL (SET/KITS/TRAYS/PACK) ×5 IMPLANT
SHEATH ANL2 6FRX45 HC (SHEATH) ×5 IMPLANT
SHEATH BRITE TIP 5FRX11 (SHEATH) ×5 IMPLANT
SYR MEDRAD MARK V 150ML (SYRINGE) ×5 IMPLANT
TUBE TRACH REINF 7.0 CUF (TUBING) ×5 IMPLANT
TUBING CONTRAST HIGH PRESS 72 (TUBING) ×5 IMPLANT
WIRE J 3MM .035X145CM (WIRE) ×5 IMPLANT

## 2015-08-02 NOTE — Op Note (Signed)
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  Abdominal aortogram  2.  Left lower extremity angiography third order catheter placement  3.  Coil embolization of left pseudoaneurysm   Anesthesia: Conscious sedation with Versed and fentanyl  Sheath: 6 French right common femoral artery  Contrast: 75 cc  Fluoroscopy Time: 22.3 minutes  Indications:  Patient is new to this area he recently had a right heart catheterization in [place] subsequent used to develop pain in his left groin. Workup including a CT scan has demonstrated a pseudoaneurysm that appears to be of the common femoral artery or a branch thereof on the left side. The risks and benefits of angiography with the possibility for intervention were reviewed all questions answered patient has agreed to proceed  Procedure:  David Rice a 69 y.o. male who was identified and appropriate procedural time out was performed.  The patient was then placed supine on the table and prepped and draped in the usual sterile fashion.  Ultrasound was used to evaluate the right common femoral artery.  It was patent .  A digital ultrasound image was acquired.  A micropuncture needle was used to access the right common femoral artery under direct ultrasound guidance and a permanent image was performed.  Microwire followed by a micro-sheath. A 0.035 J wire was advanced without resistance and a 5Fr sheath was placed.    Pigtail catheter is advanced to the level of T12 and AP projection of the aorta is obtained. Pigtail catheter was repositioned to above the  aortic bifurcation and oblique view of the pelvis is obtained. Using the pigtail catheter and a stiff angled Glidewire the aortic bifurcation was crossed and and LAO projection of the left groin is obtained. Pseudoaneurysm is noted but is not well visualized. Stiff angle glide wires then advanced through the pigtail catheter and the pigtail catheter was removed the 5 [place] sheath is exchanged for a Ruso. Multiple oblique views of the left groin are then obtained. 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 The Pigtail catheter was then positioned within the sac of the aneurysm and to coils are deployed an 8 x 10 and subsequently an 8 x 20. Follow-up imaging demonstrates the complete cessation of forward flow in the pseudoaneurysm and elimination of the AV fistula upon injection of contrast in the common femoral artery. Sheath is then pulled back into the external iliac artery on the right oblique views obtained and a Star close device deployed. There are no immediate Complications  Findings:   Aortogram:  The abdominal aorta demonstrates a small aneurysm. Iliac arteries are widely patent.  Right Lower Extremity:  Right common femoral artery distal external visualized portions of the profunda femoris and superficial femoral arteries are all widely patent. Puncture site is in the midportion of the common femoral.  Left Lower Extremity:  The left common and external iliac arteries are widely patent. The circumflex arteries are noted at the level of the ileo-inguinal ligament and  these are widely patent there is no associated pseudoaneurysm with these as is suggested by the CT scan. The common femoral is widely patent as is the profunda femoris. After multiple different LAO RAO and AP views a  small branch off of the SFA is identified as the source of the pseudoaneurysm. 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  This branch is then selected with the KMP catheter and the aneurysm sac is verified with contrast injection. Subsequently 2 coils are deployed which causes cessation of filling of the sac as well as elimination of the AV fistula    Disposition: Patient was taken to the recovery room in stable condition having tolerated the procedure well.  Schnier, Dolores Lory 08/02/2015,3:45 PM

## 2015-08-02 NOTE — H&P (Signed)
[facility] History & Physical Update  The patient was interviewed and re-examined.  The patient's previous History and Physical has been reviewed and is unchanged.  There is no change in the plan of care. We plan to proceed with the scheduled procedure.  Schnier, Dolores Lory, MD  08/02/2015, 3:44 PM

## 2015-08-02 NOTE — Discharge Instructions (Signed)

## 2015-08-05 ENCOUNTER — Ambulatory Visit (INDEPENDENT_AMBULATORY_CARE_PROVIDER_SITE_OTHER): Payer: Medicare Other

## 2015-08-05 DIAGNOSIS — E780 Pure hypercholesterolemia: Secondary | ICD-10-CM | POA: Diagnosis not present

## 2015-08-05 DIAGNOSIS — N400 Enlarged prostate without lower urinary tract symptoms: Secondary | ICD-10-CM | POA: Insufficient documentation

## 2015-08-05 DIAGNOSIS — I4891 Unspecified atrial fibrillation: Secondary | ICD-10-CM

## 2015-08-05 DIAGNOSIS — K219 Gastro-esophageal reflux disease without esophagitis: Secondary | ICD-10-CM | POA: Diagnosis not present

## 2015-08-05 DIAGNOSIS — I10 Essential (primary) hypertension: Secondary | ICD-10-CM | POA: Diagnosis not present

## 2015-08-06 ENCOUNTER — Encounter: Payer: Self-pay | Admitting: Vascular Surgery

## 2015-08-06 DIAGNOSIS — E780 Pure hypercholesterolemia: Secondary | ICD-10-CM | POA: Diagnosis not present

## 2015-08-15 ENCOUNTER — Ambulatory Visit (HOSPITAL_COMMUNITY)
Admission: RE | Admit: 2015-08-15 | Discharge: 2015-08-15 | Disposition: A | Discharge status: Home / Self Care | Payer: Medicare Other | Source: Ambulatory Visit | Attending: Cardiovascular Disease | Admitting: Cardiovascular Disease

## 2015-08-15 ENCOUNTER — Other Ambulatory Visit: Payer: Medicare Other

## 2015-08-15 DIAGNOSIS — I340 Nonrheumatic mitral (valve) insufficiency: Secondary | ICD-10-CM | POA: Diagnosis not present

## 2015-08-15 DIAGNOSIS — E785 Hyperlipidemia, unspecified: Secondary | ICD-10-CM | POA: Insufficient documentation

## 2015-08-15 DIAGNOSIS — I517 Cardiomegaly: Secondary | ICD-10-CM | POA: Diagnosis not present

## 2015-08-15 DIAGNOSIS — I4891 Unspecified atrial fibrillation: Secondary | ICD-10-CM | POA: Diagnosis not present

## 2015-08-15 DIAGNOSIS — I10 Essential (primary) hypertension: Secondary | ICD-10-CM | POA: Insufficient documentation

## 2015-08-15 DIAGNOSIS — I351 Nonrheumatic aortic (valve) insufficiency: Secondary | ICD-10-CM | POA: Insufficient documentation

## 2015-08-19 ENCOUNTER — Telehealth: Payer: Self-pay

## 2015-08-19 DIAGNOSIS — I499 Cardiac arrhythmia, unspecified: Secondary | ICD-10-CM | POA: Diagnosis not present

## 2015-08-19 DIAGNOSIS — M79609 Pain in unspecified limb: Secondary | ICD-10-CM | POA: Diagnosis not present

## 2015-08-19 DIAGNOSIS — E785 Hyperlipidemia, unspecified: Secondary | ICD-10-CM | POA: Diagnosis not present

## 2015-08-19 DIAGNOSIS — I724 Aneurysm of artery of lower extremity: Secondary | ICD-10-CM | POA: Diagnosis not present

## 2015-08-19 NOTE — Telephone Encounter (Signed)
Pt presented to the office after his appt w/Dr. Delana Meyer. Reviewed echo results. Pt states he has not taken his diltiazem in a few days and feels much better. States since beginning this med, he has gained 30lbs and lacks energy. He knows he needs to be on a medication for heart rate/rhythm but would like to discuss other options.  Appt moved up to 10/6 at 3:30pm with Dr. Fletcher Anon. Advised pt that if fatigue and weight gain are only symptoms, he should continue all medications until Thursday appt. Pt verbalized understanding however, he did not state that he would restart diltiazem Forward to MD

## 2015-08-21 ENCOUNTER — Encounter: Attending: Urology | Primary: Family Medicine

## 2015-08-22 ENCOUNTER — Ambulatory Visit (INDEPENDENT_AMBULATORY_CARE_PROVIDER_SITE_OTHER): Payer: Medicare Other | Admitting: Cardiovascular Disease

## 2015-08-22 ENCOUNTER — Encounter: Payer: Self-pay | Admitting: Cardiovascular Disease

## 2015-08-22 VITALS — BP 132/90 | HR 57 | Ht 74.0 in | Wt 240.8 lb

## 2015-08-22 DIAGNOSIS — I10 Essential (primary) hypertension: Secondary | ICD-10-CM

## 2015-08-22 DIAGNOSIS — I712 Thoracic aortic aneurysm, without rupture: Secondary | ICD-10-CM

## 2015-08-22 DIAGNOSIS — I481 Persistent atrial fibrillation: Secondary | ICD-10-CM | POA: Diagnosis not present

## 2015-08-22 DIAGNOSIS — I4891 Unspecified atrial fibrillation: Secondary | ICD-10-CM | POA: Diagnosis not present

## 2015-08-22 DIAGNOSIS — I7121 Aneurysm of the ascending aorta, without rupture: Secondary | ICD-10-CM

## 2015-08-22 DIAGNOSIS — I4819 Other persistent atrial fibrillation: Secondary | ICD-10-CM

## 2015-08-22 DIAGNOSIS — E785 Hyperlipidemia, unspecified: Secondary | ICD-10-CM

## 2015-08-22 MED ORDER — APIXABAN 5 MG PO TABS: 5.0000 mg | ORAL_TABLET | Freq: Two times a day (BID) | ORAL | Status: DC

## 2015-08-22 NOTE — Patient Instructions (Signed)
Medication Instructions:  Your physician has recommended you make the following change in your medication:  STOP taking aspirin RESUME your losartan START taking eliquis 5mg  twice per day   Labwork: none  Testing/Procedures: none  Follow-Up: Your physician recommends that you schedule a follow-up appointment in: three months with Dr. Fletcher Anon.    Any Other Special Instructions Will Be Listed Below (If Applicable).

## 2015-08-22 NOTE — Progress Notes (Signed)
HPI  This is a pleasant 69 year old male who is here today for a follow-up visit regarding persistent atrial fibrillation. He was diagnosed with atrial fibrillation in December 2015 in [place]. He was initially treated with atenolol and flecainide but did not tolerate the medications due to extreme fatigue. He ultimately underwent ablation in July of this year by Dr. Josephine Igo.  He has known history of hypertension and hyperlipidemia. He reports no previous history of coronary artery disease with a treadmill nuclear stress test in December 2015 which was normal. There is no history of stroke or congestive heart failure. He is a lifelong nonsmoker and drinks occasional wine. He was diagnosed with left common femoral artery pseudoaneurysm likely related to his recent ablation procedure in July and underwent coil placement by Dr. Delana Meyer.  His anticoagulation has been on hold. During last visit, he complained of significant palpitations. I performed a 48-hour Holter monitor which showed frequent runs of atrial fibrillation with rapid ventricular response which correlated with the patient's reported events. He also had frequent PVCs with 20,000 beats in 48 hours.  Echocardiogram showed normal LV systolic function and no significant valvular abnormalities. The patient reported extreme fatigue with all his medications and thus he stopped all of them including losartan, diltiazem and atorvastatin. He reports that his palpitations resolved completely over the last few days.    Allergies  Allergen Reactions  . Lactose Intolerance (Gi)      Current Outpatient Prescriptions on File Prior to Visit  Medication Sig Dispense Refill  . esomeprazole (NEXIUM) 20 MG capsule Take 20 mg by mouth daily at 12 noon.    . loratadine (CLARITIN) 10 MG tablet Take 10 mg by mouth daily.    . Lactase (LACTAID PO) Take by mouth. Take 3 capsules daily.    Marland Kitchen losartan (COZAAR) 25 MG tablet Take 25 mg by mouth daily.    .  silodosin (RAPAFLO) 8 MG CAPS capsule Take 8 mg by mouth daily with breakfast.     No current facility-administered medications on file prior to visit.     Past Medical History  Diagnosis Date  . A-fib ([place])   . Bowel obstruction ([place])   . Hearing impaired   . Diverticulitis   . Hypertension   . Thoracic aortic aneurysm without rupture ([place])   . Sleep apnea   . AAA (abdominal aortic aneurysm) ([place])   . Essential hypertension   . Hyperlipidemia   . Persistent atrial fibrillation [facility])     Status post ablation in July 2016 in [place].     Past Surgical History  Procedure Laterality Date  . Cardiac electrophysiology mapping and ablation    . Abdominal surgery    . Hernia repair    . Peripheral vascular catheterization N/A 08/02/2015    Procedure: Abdominal Aortogram w/Lower Extremity;  Surgeon: Katha Cabal, MD;  Location: [facility];  Service: Cardiovascular;  Laterality: N/A;  . Peripheral vascular catheterization  08/02/2015    Procedure: Lower Extremity Intervention;  Surgeon: Katha Cabal, MD;  Location: [facility];  Service: Cardiovascular;;  . Cardiac catheterization  11/2014    [place]     Family History  Problem Relation Age of Onset  . Family history unknown: Yes     Social History   Social History  . Marital Status: Single    Spouse Name: N/A  . Number of Children: N/A  . Years of Education: N/A   Occupational History  .  Not on file.   Social History Main Topics  . Smoking status: Never Smoker   . Smokeless tobacco: Not on file  . Alcohol Use: 1.2 oz/week    2 Glasses of wine per week  . Drug Use: No  . Sexual Activity: Not on file   Other Topics Concern  . Not on file   Social History Narrative     ROS A 10 point review of system was performed. It is negative other than that mentioned in the history of present illness.   PHYSICAL EXAM   BP 132/90 mmHg  Pulse 57  Ht 6\' 2"  (1.88 m)   Wt 240 lb 12 oz (109.203 kg)  BMI 30.90 kg/m2 Constitutional: He is oriented to person, place, and time. He appears well-developed and well-nourished. No distress.  HENT: No nasal discharge.  Head: Normocephalic and atraumatic.  Eyes: Pupils are equal and round.  No discharge. Neck: Normal range of motion. Neck supple. No JVD present. No thyromegaly present.  Cardiovascular: Normal rate, regular rhythm, normal heart sounds. Exam reveals no gallop and no friction rub. No murmur heard.  Pulmonary/Chest: Effort normal and breath sounds normal. No stridor. No respiratory distress. He has no wheezes. He has no rales. He exhibits no tenderness.  Abdominal: Soft. Bowel sounds are normal. He exhibits no distension. There is no tenderness. There is no rebound and no guarding.  Musculoskeletal: Normal range of motion. He exhibits no edema and no tenderness.  Neurological: He is alert and oriented to person, place, and time. Coordination normal.  Skin: Skin is warm and dry. No rash noted. He is not diaphoretic. No erythema. No pallor.  Psychiatric: He has a normal mood and affect. His behavior is normal. Judgment and thought content normal.       EKG: Normal sinus rhythm withfirst degree AV block and possible old septal infarct.  ASSESSMENT AND PLAN

## 2015-08-23 NOTE — Assessment & Plan Note (Signed)
The patient reports complete resolution of palpitations over the last few days. He reports feeling much better since she stopped taking all his medications. I explained to him that Eliquis is unlikely to cause fatigue. Diltiazem might have been contributing. The patient wants to be on minimal medications. CHADS VASc score is 2 due to age and hypertension. Thus, I favor continued anticoagulation at the present time especially the next 6 months. Continue to monitor his symptoms of palpitations. He is usually very aware of atrial fibrillation.

## 2015-08-23 NOTE — Assessment & Plan Note (Signed)
This measured 4.8 cm on recent CT scan and should be followed every 6 months. Blood pressure should be controlled.

## 2015-08-23 NOTE — Assessment & Plan Note (Signed)
Given the presence of ascending aortic aneurysm, I resumed losartan 25 mg by mouth once daily.

## 2015-08-23 NOTE — Assessment & Plan Note (Signed)
I don't have results of his lipid profile. Thus, I cannot comment if he should resume atorvastatin or not. I asked him to discuss this with his primary care physician.

## 2015-09-04 DIAGNOSIS — G4733 Obstructive sleep apnea (adult) (pediatric): Secondary | ICD-10-CM | POA: Diagnosis not present

## 2015-09-06 ENCOUNTER — Ambulatory Visit: Payer: Medicare Other | Admitting: Cardiovascular Disease

## 2015-09-09 ENCOUNTER — Ambulatory Visit: Payer: Medicare Other | Admitting: Cardiovascular Disease

## 2015-09-10 ENCOUNTER — Ambulatory Visit (INDEPENDENT_AMBULATORY_CARE_PROVIDER_SITE_OTHER): Payer: Medicare Other | Admitting: Internal Medicine

## 2015-09-10 ENCOUNTER — Encounter: Payer: Self-pay | Admitting: Internal Medicine

## 2015-09-10 VITALS — BP 114/72 | HR 74 | Temp 98.4°F | Resp 12 | Ht 74.0 in | Wt 243.0 lb

## 2015-09-10 DIAGNOSIS — E669 Obesity, unspecified: Secondary | ICD-10-CM

## 2015-09-10 DIAGNOSIS — E785 Hyperlipidemia, unspecified: Secondary | ICD-10-CM

## 2015-09-10 DIAGNOSIS — H903 Sensorineural hearing loss, bilateral: Secondary | ICD-10-CM

## 2015-09-10 DIAGNOSIS — I4819 Other persistent atrial fibrillation: Secondary | ICD-10-CM

## 2015-09-10 DIAGNOSIS — N400 Enlarged prostate without lower urinary tract symptoms: Secondary | ICD-10-CM

## 2015-09-10 DIAGNOSIS — Z9989 Dependence on other enabling machines and devices: Secondary | ICD-10-CM

## 2015-09-10 DIAGNOSIS — I10 Essential (primary) hypertension: Secondary | ICD-10-CM

## 2015-09-10 DIAGNOSIS — Z7901 Long term (current) use of anticoagulants: Secondary | ICD-10-CM

## 2015-09-10 DIAGNOSIS — Z8679 Personal history of other diseases of the circulatory system: Secondary | ICD-10-CM

## 2015-09-10 DIAGNOSIS — H348192 Central retinal vein occlusion, unspecified eye, stable: Secondary | ICD-10-CM

## 2015-09-10 DIAGNOSIS — G4733 Obstructive sleep apnea (adult) (pediatric): Secondary | ICD-10-CM

## 2015-09-10 DIAGNOSIS — Z79899 Other long term (current) drug therapy: Secondary | ICD-10-CM

## 2015-09-10 DIAGNOSIS — Z23 Encounter for immunization: Secondary | ICD-10-CM | POA: Diagnosis not present

## 2015-09-10 DIAGNOSIS — Z1283 Encounter for screening for malignant neoplasm of skin: Secondary | ICD-10-CM

## 2015-09-10 DIAGNOSIS — I481 Persistent atrial fibrillation: Secondary | ICD-10-CM

## 2015-09-10 MED ORDER — WARFARIN SODIUM 5 MG PO TABS: 5.0000 mg | ORAL_TABLET | Freq: Every day | ORAL | Status: DC

## 2015-09-10 NOTE — Progress Notes (Signed)
Pre-visit discussion using our clinic review tool. No additional management support is needed unless otherwise documented below in the visit note.  
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
## 2015-09-10 NOTE — Patient Instructions (Addendum)
We are starting coumadin today at 5 mg  With dinner  Stop the aspirin  Return in one week for fasting labs and coumadin level  Return to see me in one month    This is  my version of a  "Low GI"  Weight loss Diet:  It has enabled many of my patients to lose up to 10 lbs per month depending on how much you restrict your carbs.   follow it carefully.  The idea behind low carb diets is that your body has to take the fat and protein in your food and turn it into an energy that is less efficient than carbohydrates, so you lose weight.    I have listed several choices at each of your 6 meal times to make it easy to shop.and plan    Remember that snack Substitutions should be equal to or less than 5 NET carbs per serving and  The only carbs in meals come from the pasta or vegetables so, they should be < 15 net carbs. Remember that carbohydrates from fiber do not count, so you can  subtract fiber grams to get the "net carbs " of any particular food item.    All of the foods can be found at grocery stores and in bulk at [employer].  The Atkins protein bars and shakes are available in more varieties at [employer], [employer] and [place].     7 AM Breakfast:  Choose from the following:  Low carbohydrate Protein  Shakes (I recommend the EAS AdvantEdge "Carb Control" shakes, Atkins,  Muscle Milk or Premier Protein shakes  All are < 4  carbs . My favorite is the premier protein chocolate   a scrambled egg/bacon/cheese burrito made with Mission's "carb balance" whole wheat tortilla  (this particular tortilla has only 6 net carbs, once you subtract the fiber grams  )  A slice of home made fritatta (egg based dish without a crust:  google it)    Avoid cereal and bananas, oatmeal and cream of wheat and grits. They are loaded with carbohydrates and have too few fiber grams    10 AM: high protein snack  Protein bar by Atkins  Or KIND  (the lw sugar variety,  Not all are low , under 200 cal, usually < 6 net carbs).     A stick of cheese:  Around 1 carb,  100 cal      Other so called "protein bars" and Greek yogurts tend to be loaded with carbohydrates.  Remember, in food advertising, the word "energy" is synonymous for " carbohydrate."  Lunch:   A Sandwich using the bread choices listed below,  You  Can use any  Eggs,  lunchmeat, grilled meat or canned tuna), avocado, regular mayo/mustard  and cheese.  A Salad using blue cheese, ranch,  Goddess or vinagrette,  No croutons or "confetti" and no "candied nuts" but regular nuts OK. No "fat free": they add sugar for taste  No pretzels or chips.  Pickles and miniature sweet peppers are a good low carb alternative that provide a "crunch"   Please Note:  The bread is the only source of carbohydrate in a sandwich and  can be decreased by trying some of these alternatives to traditional loaf bread. David Rice has the best selection:  Joseph's makes a pita bread and a flat ("Lavash")  bread that are 50 cal and 4 net carbs and are available at [place] and [place].  These can be  toasted to make them taste better,  And can be  used as a pita chip to use with hummous as well  Toufayan makes a low carb flatbread that's 100 cal and 9 net carbs available at [employer] and [employer]J's makes 2 sizes of  Low carb whole wheat tortilla  (The large one is 210 cal and 6 net carbs, the small is 120 cal and also 6 net carbs)  Flat Out makes flatbreads that are low carb as well . They're called "Fold Its")   Avoid "Low fat dressings, as well as Barry Brunner and Bonney dressings They are loaded with sugar!   3 PM/ Mid day  Snack:  Consider  1 ounce of  almonds, walnuts, pistachios, pecans, peanuts,  Macadamia nuts or a nut medley are ok .  Avoid "granola"; the dried cranberries and raisins are loaded with carbohydrates. Mixed nuts as long as there are no raisins,  cranberries or dried fruit.    Try the prosciutto/mozzarella cheese sticks by [employer]  In deli /backery section    High protein 80 cal each , 1 carb   To avoid overindulging in snacks: Try drinking a glass of unsweeted almond/coconut milk  Or a cup of coffee with your Atkins chocolate bar to keep you from having 3!!!   Pork rinds!  Yes Pork Rinds  Are on the diet .  They are your potato chip.        6 PM  Dinner:     Meat/fowl/fish with a green salad, with steamed/grilled/sauteed vegggie: broccoli, cauliflower, green beans, spinach, brussel sprouts or  Lima beans. DO NOT BREAD THE PROTEIN!!      There is a low carb pasta by Dreamfield's that is acceptable and tastes great: only 5 digestible carbs/serving.( All grocery stores but BJs carry it )  Try Hurley Cisco Angelo's chicken piccata or chicken or eggplant parm over low carb pasta.(Lowes and BJs)   Marjory Lies Sanchez's "Carnitas" (pulled pork, no sauce,  0 carbs) can be used to make a dinner  burrito (available at BJ's ) and his pot roast is also without veggies or potatoes   Pesto over low carb pasta (bj's sells a good quality pesto in the center refrigerated section of the deli   Try satueeing  Cheral Marker with mushrooms as another side dish   Whole wheat pasta is still full of digestible carbs and  Not as low in glycemic index as Dreamfield's.   Brown rice is still rice,  So skip the rice and noodles if you eat [place] or [place] (or at least limit to 1 cooked cup)  9 PM snack :   Breyer's "low carb" fudgsicle or  ice cream bar (Carb Smart line), or  Weight Watcher's ice cream bar , or another "no sugar added" ice cream;  a serving of fresh berries/cherries with whipped cream   Cheese or DANNON'S LlGHT N FIT GREEK YOGURT or the [place] greek yogurt   8 ounces of [place] unsweetened almond/cococunut milk  Cheese and crackers (using WASA crackers,  They are low carb) or peanut butter on low carb crackers or pita bread     Avoid bananas, pineapple, grapes  and watermelon on a regular basis because they are high in sugar.  THINK OF THEM AS DESSERT. Stick to" berries  and cherries"  [phone]

## 2015-09-11 ENCOUNTER — Telehealth: Payer: Self-pay | Admitting: *Deleted

## 2015-09-11 NOTE — Telephone Encounter (Signed)
Patient dropped off paper work from [place] [facility], patient stated this paper work may need to be viewed by the physician, if not, throw away. Paperwork in box.

## 2015-09-12 DIAGNOSIS — Z1283 Encounter for screening for malignant neoplasm of skin: Secondary | ICD-10-CM | POA: Insufficient documentation

## 2015-09-12 DIAGNOSIS — Z9989 Dependence on other enabling machines and devices: Secondary | ICD-10-CM

## 2015-09-12 DIAGNOSIS — G4733 Obstructive sleep apnea (adult) (pediatric): Secondary | ICD-10-CM | POA: Insufficient documentation

## 2015-09-12 DIAGNOSIS — K648 Other hemorrhoids: Secondary | ICD-10-CM | POA: Insufficient documentation

## 2015-09-12 DIAGNOSIS — H903 Sensorineural hearing loss, bilateral: Secondary | ICD-10-CM | POA: Insufficient documentation

## 2015-09-12 DIAGNOSIS — K5792 Diverticulitis of intestine, part unspecified, without perforation or abscess without bleeding: Secondary | ICD-10-CM | POA: Insufficient documentation

## 2015-09-12 DIAGNOSIS — E669 Obesity, unspecified: Secondary | ICD-10-CM | POA: Insufficient documentation

## 2015-09-12 DIAGNOSIS — Z8679 Personal history of other diseases of the circulatory system: Secondary | ICD-10-CM | POA: Insufficient documentation

## 2015-09-12 DIAGNOSIS — K635 Polyp of colon: Secondary | ICD-10-CM | POA: Insufficient documentation

## 2015-09-12 NOTE — Assessment & Plan Note (Signed)
Acoustic trauma induced during service in the [employer].  Still requires near shouting despite use of  hearing aids. Receives new ones every 3 years feom [place]

## 2015-09-12 NOTE — Assessment & Plan Note (Signed)
Well controlled on current regimen. Renal function stable, no changes today. Lab Results  Component Value Date   CREATININE 1.13 07/28/2015   Lab Results  Component Value Date   NA 138 07/28/2015   K 4.1 07/28/2015   CL 103 07/28/2015   CO2 26 07/28/2015

## 2015-09-12 NOTE — Assessment & Plan Note (Signed)
Referral to [facility]

## 2015-09-12 NOTE — Assessment & Plan Note (Signed)
Diagnosed by sleep study. Patient is wearing  CPAP every night a minimum of 6 hours per night and notes improved daytime wakefulness and decreased fatigue

## 2015-09-12 NOTE — Assessment & Plan Note (Signed)
Needs local opthalmology follow up

## 2015-09-12 NOTE — Assessment & Plan Note (Signed)
Needs local urology follow up,

## 2015-09-12 NOTE — Assessment & Plan Note (Signed)
I have addressed  BMI and recommended wt loss of 10% of body weigh over the next 6 months using a low glycemic index diet and regular exercise a minimum of 5 days per week. Screening for diabetes , thyroid  disease  Needed.

## 2015-09-12 NOTE — Assessment & Plan Note (Signed)
Rate controlled on cardizem.  Trial of coumadin starting with 5 mg daily. Baseline pt/inr ordered.  No results found for: INR, PROTIME

## 2015-09-12 NOTE — Telephone Encounter (Signed)
Placed in red folder instead of review since patient left message.

## 2015-09-12 NOTE — Assessment & Plan Note (Signed)
Managed with low dose atorvastatin.  Will return for fasting labs  No results found for: CHOL, HDL, LDLCALC, LDLDIRECT, TRIG, CHOLHDL

## 2015-09-16 ENCOUNTER — Encounter: Payer: Self-pay | Admitting: Internal Medicine

## 2015-09-17 ENCOUNTER — Encounter: Payer: Self-pay | Admitting: Internal Medicine

## 2015-09-19 ENCOUNTER — Encounter: Payer: Self-pay | Admitting: Cardiovascular Disease

## 2015-09-19 ENCOUNTER — Ambulatory Visit (INDEPENDENT_AMBULATORY_CARE_PROVIDER_SITE_OTHER): Payer: Medicare Other

## 2015-09-19 ENCOUNTER — Encounter: Payer: Self-pay | Admitting: Internal Medicine

## 2015-09-19 ENCOUNTER — Telehealth: Payer: Self-pay | Admitting: Cardiovascular Disease

## 2015-09-19 VITALS — BP 149/84 | HR 73 | Resp 16 | Ht 74.0 in | Wt 241.1 lb

## 2015-09-19 DIAGNOSIS — R002 Palpitations: Secondary | ICD-10-CM

## 2015-09-19 NOTE — Telephone Encounter (Signed)
Pt is in the office.  Put him on schedule for nurse visit/EKG

## 2015-09-19 NOTE — Telephone Encounter (Signed)
Patient c/o Palpitations:  High priority if patient c/o lightheadedness and shortness of breath.  1. How long have you been having palpitations?  Yesterday at 11 pm when laying down   Felt like fluttering says he always feels like he is skipping beats but this was different   L breast -under arm pain lasted an hour and still sore   2. Are you currently experiencing lightheadedness and shortness of breath?  Lightheadedness on exertion and sob like can never get a deep enough breath   3. Have you checked your BP and heart rate? No  See visit at [facility] office last week  4. Are you experiencing any other symptoms?  Lightheaded on exertion not sob but feels like he is exerted all the time even when resting chest pain L breast to under armpit   No pressure or squeezing sensation

## 2015-09-19 NOTE — Patient Instructions (Signed)
1.) Reason for visit: Pt presented to office c/o palpitations  2.) Name of MD requesting visit: none  3.) H&P: Afib. Pt was taking atenolol and flecainide but discontinued d/t extreme fatigue. Had ablation July 2016. Currently takes diltiazem 240mg  qd.  4.) ROS related to problem: Pt reports worsening palpitations for the last week. He takes diltiazem in the evenings before bed and typically sleeps 7-8 hours. This week he has felt increased palpitations which have kept him up at night. He reports symptoms during the day as well. Experiencing pain around left side at his ribs. States it feels like he has been punched.      At 10/6 OV, he was instructed to restart Eliquis 5mg  BID. He states Eliquis makes him very tired and at 10/25 OV with Dr. Derrel Nip, Eliquis d/c'd and coumadin started.   5.) Assessment and plan per MD: EKG given to Dr. Fletcher Anon for review.    Per verbal from Dr. Fletcher Anon, pt needs 24 hour holter monitor and appt w/Dr. Caryl Comes next week.  Reviewed plan w/pt

## 2015-09-20 ENCOUNTER — Encounter: Payer: Self-pay | Admitting: Internal Medicine

## 2015-09-23 ENCOUNTER — Ambulatory Visit: Payer: Medicare Other | Admitting: Physician Assistant

## 2015-09-23 ENCOUNTER — Other Ambulatory Visit (INDEPENDENT_AMBULATORY_CARE_PROVIDER_SITE_OTHER): Payer: Medicare Other

## 2015-09-23 DIAGNOSIS — Z7901 Long term (current) use of anticoagulants: Secondary | ICD-10-CM

## 2015-09-23 DIAGNOSIS — E785 Hyperlipidemia, unspecified: Secondary | ICD-10-CM | POA: Diagnosis not present

## 2015-09-23 LAB — PROTIME-INR
INR: 1.5 ratio — AB (ref 0.8–1.0)
Prothrombin Time: 16.1 s — ABNORMAL HIGH (ref 9.6–13.1)

## 2015-09-23 LAB — LIPID PANEL
CHOL/HDL RATIO: 3
Cholesterol: 145 mg/dL (ref 0–200)
HDL: 44.9 mg/dL (ref >39.00)
LDL Cholesterol: 74 mg/dL (ref 0–99)
NONHDL: 99.78
TRIGLYCERIDES: 130 mg/dL (ref 0.0–149.0)
VLDL: 26 mg/dL (ref 0.0–40.0)

## 2015-09-24 ENCOUNTER — Encounter: Payer: Self-pay | Admitting: Internal Medicine

## 2015-09-25 ENCOUNTER — Encounter: Payer: Self-pay | Admitting: Internal Medicine

## 2015-09-25 ENCOUNTER — Other Ambulatory Visit: Payer: Self-pay | Admitting: Internal Medicine

## 2015-09-25 ENCOUNTER — Ambulatory Visit (INDEPENDENT_AMBULATORY_CARE_PROVIDER_SITE_OTHER): Payer: Medicare Other | Admitting: Internal Medicine

## 2015-09-25 VITALS — BP 140/70 | HR 62 | Ht 74.0 in | Wt 240.0 lb

## 2015-09-25 DIAGNOSIS — G4733 Obstructive sleep apnea (adult) (pediatric): Secondary | ICD-10-CM

## 2015-09-25 DIAGNOSIS — I712 Thoracic aortic aneurysm, without rupture, unspecified: Secondary | ICD-10-CM

## 2015-09-25 DIAGNOSIS — I4891 Unspecified atrial fibrillation: Secondary | ICD-10-CM | POA: Insufficient documentation

## 2015-09-25 DIAGNOSIS — Z9989 Dependence on other enabling machines and devices: Principal | ICD-10-CM

## 2015-09-25 DIAGNOSIS — G622 Polyneuropathy due to other toxic agents: Secondary | ICD-10-CM

## 2015-09-25 MED ORDER — DILTIAZEM HCL 30 MG PO TABS: ORAL_TABLET | ORAL | Status: DC

## 2015-09-25 NOTE — Patient Instructions (Signed)
Medication Instructions: 1) Start Diltiazem 30 mg one tablet by mouth daily as needed for palpitations or fast heart rates  Labwork: - none  Procedures/Testing: - none  Follow-Up: - You have been referred to : Dr. [name]- thoracic aneurysm referral  - Your physician recommends that you schedule a follow-up appointment in: 2-3 months with Dr. Caryl Comes.  Any Additional Special Instructions Will Be Listed Below (If Applicable). - AliveCor monitor ( may purchase on [employer] )

## 2015-09-25 NOTE — Progress Notes (Signed)
ELECTROPHYSIOLOGY CONSULT NOTE  Patient ID: David Rice, MRN: [id number], DOB/AGE: 11/21/1945 69 y.o. Admit date: (Not on file) Date of Consult: 09/25/2015  Primary Physician: Crecencio Mc, MD Primary Cardiologist: MA  Chief Complaint: AFib and PVCs   HPI David Rice is a 69 y.o. male  Seen for atrial fibrillation and PVCs.  He has a history of atrial fibrillation diagnosed apparently 12/15. Therapies with atenolol and flecainide were not tolerated and he underwent catheter ablation 7/16. This was complicated by a left femoral pseudoaneurysm and he underwent coiling in July of this year.  It is his impression that the medications have been his biggest problem Following his ablation he felt like it took months to get over the effects of the anesthetic. Over the last week or 2 he is finally feeling better.  He has continued to have problems with palpitations. This is primarily described as pounding. Not withstanding this, a Holter monitor showed runs of atrial fibrillation with a rapid response correlating with his symptoms. He also had ventricular ectopy, 7-10%. They were of a left bundle superior axis morphology.  Echocardiogram 9/16 demonstrated normal LV function and normal left atrial size.  CTA demonstrated a 4.8 ascending aortic aneurysm and a 3.1 infrarenal abdominal aortic aneurysm.  The above pseudoaneurysm was also noted  He has treated sleep apnea.  His CHADS-VASc score is 2-3; age-39 hypertension-1 and probably vascular disease-1   Past Medical History  Diagnosis Date  . A-fib ([place])   . Bowel obstruction ([place])   . Hearing impaired   . Diverticulitis   . Hypertension   . Thoracic aortic aneurysm without rupture ([place])   . Sleep apnea   . AAA (abdominal aortic aneurysm) ([place])   . Essential hypertension   . Hyperlipidemia   . Persistent atrial fibrillation [facility])     Status post ablation in July 2016 in [place].      Surgical History:  Past  Surgical History  Procedure Laterality Date  . Cardiac electrophysiology mapping and ablation    . Abdominal surgery    . Hernia repair    . Peripheral vascular catheterization N/A 08/02/2015    Procedure: Abdominal Aortogram w/Lower Extremity;  Surgeon: Katha Cabal, MD;  Location: [facility];  Service: Cardiovascular;  Laterality: N/A;  . Peripheral vascular catheterization  08/02/2015    Procedure: Lower Extremity Intervention;  Surgeon: Katha Cabal, MD;  Location: [facility];  Service: Cardiovascular;;  . Cardiac catheterization  11/2014    [place] Meds: Prior to Admission medications   Medication Sig Start Date End Date Taking? Authorizing Provider  atorvastatin (LIPITOR) 10 MG tablet Take 10 mg by mouth daily.   Yes Historical Provider, MD  diltiazem (DILACOR XR) 240 MG 24 hr capsule Take 240 mg by mouth daily.   Yes Historical Provider, MD  esomeprazole (NEXIUM) 20 MG capsule Take 20 mg by mouth daily at 12 noon.   Yes Historical Provider, MD  Lactase (LACTAID PO) Take by mouth. Take 3 capsules daily.   Yes Historical Provider, MD  loratadine (CLARITIN) 10 MG tablet Take 10 mg by mouth daily.   Yes Historical Provider, MD  losartan (COZAAR) 25 MG tablet Take 25 mg by mouth daily.   Yes Historical Provider, MD  warfarin (COUMADIN) 5 MG tablet Take 1 tablet (5 mg total) by mouth daily. 09/10/15  Yes Crecencio Mc, MD    Allergies:  Allergies  Allergen Reactions  . Lactose Intolerance (Gi)     Social History   Social History  . Marital Status: Single    Spouse Name: N/A  . Number of Children: N/A  . Years of Education: N/A   Occupational History  . Not on file.   Social History Main Topics  . Smoking status: Never Smoker   . Smokeless tobacco: Not on file  . Alcohol Use: 1.2 oz/week    2 Glasses of wine per week  . Drug Use: No  . Sexual Activity: Not on file   Other Topics Concern  . Not on file    Social History Narrative     Family History  Problem Relation Age of Onset  . Family history unknown: Yes     ROS:  Please see the history of present illness.     All other systems reviewed and negative.    Physical Exam: Blood pressure 140/70, pulse 62, height 6\' 2"  (1.88 m), weight 240 lb (108.863 kg). General: Well developed, well nourished male in no acute distress. Head: Normocephalic, atraumatic, sclera non-icteric, no xanthomas, nares are without discharge. EENT: normal  Lymph Nodes:  none Neck: Negative for carotid bruits. JVD not elevated. Back:without scoliosis kyphosis Lungs: Clear bilaterally to auscultation without wheezes, rales, or rhonchi. Breathing is unlabored. Heart: RRR with S1 S2. No   murmur . No rubs, or gallops appreciated. Abdomen: Soft, non-tender, non-distended with normoactive bowel sounds. No hepatomegaly. No rebound/guarding. No obvious abdominal masses. Msk:  Strength and tone appear normal for age. Extremities: No clubbing or cyanosis. No edema.  Distal pedal pulses are 2+ and equal bilaterally. Skin: Warm and Dry Neuro: Alert and oriented X 3. CN III-XII intact Grossly normal sensory and motor function . Psych:  Responds to questions appropriately with a normal affect.      Labs: Cardiac Enzymes No results for input(s): CKTOTAL, CKMB, TROPONINI in the last 72 hours. CBC Lab Results  Component Value Date   WBC 5.8 07/28/2015   HGB 15.8 07/28/2015   HCT 47.2 07/28/2015   MCV 93.1 07/28/2015   PLT 148* 07/28/2015   PROTIME:  Recent Labs  09/23/15 1127  LABPROT 16.1*  INR 1.5*   Chemistry No results for input(s): NA, K, CL, CO2, BUN, CREATININE, CALCIUM, PROT, BILITOT, ALKPHOS, ALT, AST, GLUCOSE in the last 168 hours.  Invalid input(s): LABALBU Lipids Lab Results  Component Value Date   CHOL 145 09/23/2015   HDL 44.90 09/23/2015   LDLCALC 74 09/23/2015   TRIG 130.0 09/23/2015   BNP No results found for: PROBNP Thyroid  Function Tests: No results for input(s): TSH, T4TOTAL, T3FREE, THYROIDAB in the last 72 hours.  Invalid input(s): FREET3 Miscellaneous No results found for: DDIMER  Radiology/Studies:  No results found.  EKG: Sinus rhythm at 62 Intervals 21/07/44 Unusual P wave morphology Poor R-wave progression   Assessment and Plan:  Atrial fibrillation-paroxysmal  PVCs-frequent  Sinus bradycardia  Aortic aneurysm-thoracic and abdominal  History of atrial fibrillation ablation  Obstructive sleep apnea-treated   The patient has symptomatic palpitations which I would have expected to be his PVCs by the pounding description; however, the Holter suggested to his atrial fibrillation. He is feeling pretty good at this point and so I am reluctant to perturb things by additional medications. We will add a little bit of low-dose diltiazem to take on an as-needed basis at the end of his 24 hour interval.  In the interim, we will use the [employer]ive[employer] monitor  to elucidate the mechanism of his palpitations, i.e. isn't the PVCs or the atrial fibrillation.  In the event that more drug therapy is needed, we would likely use dofetilide. Alternatively catheter ablation. Any other drug probably would result in aggravation of his underlying sinus bradycardia and the   need for pacing  With his history of a thoracic and abdominal aneurysm, we will refer him to Dr. Servando Snare for expert evaluation and consideration of genetic testing; patient felt that there is a family history of aneurysmal disease.    Virl Axe

## 2015-09-26 ENCOUNTER — Encounter: Payer: Self-pay | Admitting: Internal Medicine

## 2015-09-27 ENCOUNTER — Ambulatory Visit (INDEPENDENT_AMBULATORY_CARE_PROVIDER_SITE_OTHER): Payer: Medicare Other | Admitting: Pharmacist

## 2015-09-27 DIAGNOSIS — I4891 Unspecified atrial fibrillation: Secondary | ICD-10-CM | POA: Diagnosis not present

## 2015-09-27 LAB — POCT INR: INR: 2.9

## 2015-09-29 ENCOUNTER — Encounter: Payer: Self-pay | Admitting: Internal Medicine

## 2015-10-02 ENCOUNTER — Ambulatory Visit (INDEPENDENT_AMBULATORY_CARE_PROVIDER_SITE_OTHER): Payer: Medicare Other

## 2015-10-02 DIAGNOSIS — I4891 Unspecified atrial fibrillation: Secondary | ICD-10-CM

## 2015-10-02 LAB — POCT INR: INR: 1.5

## 2015-10-02 MED ORDER — WARFARIN SODIUM 5 MG PO TABS: ORAL_TABLET | ORAL | Status: DC

## 2015-10-03 ENCOUNTER — Encounter: Payer: Self-pay | Admitting: Internal Medicine

## 2015-10-03 DIAGNOSIS — H35372 Puckering of macula, left eye: Secondary | ICD-10-CM | POA: Diagnosis not present

## 2015-10-03 DIAGNOSIS — H348322 Tributary (branch) retinal vein occlusion, left eye, stable: Secondary | ICD-10-CM | POA: Diagnosis not present

## 2015-10-08 NOTE — Telephone Encounter (Signed)
This encounter was created in error - please disregard.

## 2015-10-09 ENCOUNTER — Institutional Professional Consult (permissible substitution) (INDEPENDENT_AMBULATORY_CARE_PROVIDER_SITE_OTHER): Payer: Medicare Other | Admitting: Cardiothoracic Surgery

## 2015-10-09 ENCOUNTER — Other Ambulatory Visit: Payer: Self-pay | Admitting: *Deleted

## 2015-10-09 ENCOUNTER — Ambulatory Visit (INDEPENDENT_AMBULATORY_CARE_PROVIDER_SITE_OTHER): Payer: Medicare Other

## 2015-10-09 ENCOUNTER — Encounter: Payer: Self-pay | Admitting: Cardiothoracic Surgery

## 2015-10-09 VITALS — BP 168/102 | HR 85 | Resp 20 | Ht 74.0 in | Wt 240.0 lb

## 2015-10-09 DIAGNOSIS — I712 Thoracic aortic aneurysm, without rupture, unspecified: Secondary | ICD-10-CM

## 2015-10-09 DIAGNOSIS — I4891 Unspecified atrial fibrillation: Secondary | ICD-10-CM | POA: Diagnosis not present

## 2015-10-09 LAB — POCT INR: INR: 3.5

## 2015-10-09 MED ORDER — VERAPAMIL HCL 120 MG PO TABS: 120.0000 mg | ORAL_TABLET | Freq: Two times a day (BID) | ORAL | Status: DC

## 2015-10-09 NOTE — Progress Notes (Signed)
[address]       [place] [address]             [phone]                    David [facility] [id number] Date of Birth: 11-26-1945  Referring: [name], MD Primary Care: Crecencio Mc, MD  Chief Complaint:    Chief Complaint  Patient presents with  . Thoracic Aortic Aneurysm    eval for TAA    History of Present Illness:    David Rice 69 y.o. male is seen in the office  today for a dilated ascending aorta first noted at cardiac catheterization done at [facility] in the summer of 2016 during the evaluation for atrial fibrillation and cardiac catheterization. The patient notes the onset of atrial fibrillation in the past year and notes that his "life went into the toilet" with the side effects from drugs used to treat his atrial fibrillation. At the time he was living in [place] and being treated in [place].  At the time of catheterization he was noted to have a dilated ascending aorta, and subsequently had a CTA of the chest abdomen and pelvis. 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 fib ablation procedure previously done. This pseudoaneurysm was occluded endovascularly at [facility] in September 2016.  Echo cardiogram basically showed a normal heart, the CT scan also found an incidental 4.7 cm ascending aortic dilatation.  The patient has no stigmata of Marfan's or other connective tissue disorder, he has no family history of early sudden death or known dissection or descending aneurysm. The patient's father died at age 93 with lung cancer was a long-term smoker, his mother is still alive and doing well at age 46, he has one brother with mental disorder but no known aneurysm he has 2 sons that are alive and healthy and well.       Current Activity/ Functional Status:  Patient is independent with mobility/ambulation,  transfers, ADL's, IADL's.   Zubrod Score: At the time of surgery this patient's most appropriate activity status/level should be described as: [x]     0    Normal activity, no symptoms []     1    Restricted in physical strenuous activity but ambulatory, able to do out light work []     2    Ambulatory and capable of self care, unable to do work activities, up and about               >50 % of waking hours                              []     3    Only limited self care, in bed greater than 50% of waking hours []     4    Completely disabled, no self care, confined to bed or chair []     5    Moribund   Past Medical History  Diagnosis Date  . A-fib ([place])   . Bowel obstruction ([place])   . Hearing impaired   . Diverticulitis   . Hypertension   . Thoracic aortic aneurysm without rupture ([place])   . Sleep apnea   . AAA (abdominal aortic aneurysm) ([place])   . Essential hypertension   .  Hyperlipidemia   . Persistent atrial fibrillation [facility])     Status post ablation in July 2016 in [place].    Past Surgical History  Procedure Laterality Date  . Cardiac electrophysiology mapping and ablation    . Abdominal surgery    . Hernia repair    . Peripheral vascular catheterization N/A 08/02/2015    Procedure: Abdominal Aortogram w/Lower Extremity;  Surgeon: Katha Cabal, MD;  Location: [facility];  Service: Cardiovascular;  Laterality: N/A;  . Peripheral vascular catheterization  08/02/2015    Procedure: Lower Extremity Intervention;  Surgeon: Katha Cabal, MD;  Location: [facility];  Service: Cardiovascular;;  . Cardiac catheterization  11/2014    [place]    Family History  Problem Relation Age of Onset  . Hypertension Mother     Social History  Patient's wife died at age 81, he raised 2 small children on his own for the next 43 years Social History  . Marital Status: Single    Spouse Name: N/A  . Number of Children: N/A  . Years of Education:  N/A   Occupational History  .  patient retired as [employer]    Social History Main Topics  . Smoking status: Never Smoker   . Smokeless tobacco: Not on file  . Alcohol Use: 1.2 oz/week    2 Glasses of wine per week  . Drug Use: No  . Sexual Activity: Not on file   Other Topics Concern  . Not on file   Social History Narrative    History  Smoking status  . Never Smoker   Smokeless tobacco  . Not on file    History  Alcohol Use  . 1.2 oz/week  . 2 Glasses of wine per week     Allergies  Allergen Reactions  . Lactose Intolerance (Gi)     Current Outpatient Prescriptions  Medication Sig Dispense Refill  . atorvastatin (LIPITOR) 10 MG tablet Take 10 mg by mouth daily.    Marland Kitchen diltiazem (CARDIZEM) 30 MG tablet Take one tablet by mouth once daily as needed for palpitations/ fast heart rate 30 tablet 3  . esomeprazole (NEXIUM) 20 MG capsule Take 20 mg by mouth daily at 12 noon.    . Lactase (LACTAID PO) Take by mouth. Take 3 capsules daily.    Marland Kitchen loratadine (CLARITIN) 10 MG tablet Take 10 mg by mouth daily.    Marland Kitchen losartan (COZAAR) 25 MG tablet Take 25 mg by mouth daily.    . verapamil (CALAN) 120 MG tablet Take 1 tablet (120 mg total) by mouth 2 (two) times daily. 60 tablet 6  . warfarin (COUMADIN) 5 MG tablet Take as directed by [facility] 45 tablet 3   No current facility-administered medications for this visit.      Review of Systems:     Cardiac Review of Systems: Y or N  Chest Pain [  n  ]  Resting SOB [ n  ] Exertional SOB  Blue.Reese  ]  Orthopnea n[  ]   Pedal Edema [ y  ]    Palpitations Blue.Reese  ] Syncope  [ n ]   Presyncope [n   ]  General Review of Systems: [Y] = yes [  ]=no Constitional: recent weight change [  ];  Wt loss over the last 3 months [   ] anorexia [  ]; fatigue [ x ]; nausea [  ]; night sweats [  ]; fever [  ];  or chills [  ];          Dental: poor dentition[  ]; Last Dentist visit:   Eye : blurred vision [  ]; diplopia [   ]; vision changes [  ];  Amaurosis  fugax[  ]; Resp: cough [  ];  wheezing[  ];  hemoptysis[  ]; shortness of breath[  ]; paroxysmal nocturnal dyspnea[  ]; dyspnea on exertion[  ]; or orthopnea[  ];  GI:  gallstones[  ], vomiting[  ];  dysphagia[  ]; melena[  ];  hematochezia [  ]; heartburn[  ];   Hx of  Colonoscopy[  ]; GU: kidney stones [  ]; hematuria[  ];   dysuria [  ];  nocturia[  ];  history of     obstruction [  ]; urinary frequency [  ]             Skin: rash, swelling[  ];, hair loss[  ];  peripheral edema[  ];  or itching[  ]; Musculosketetal: myalgias[  ];  joint swelling[  ];  joint erythema[  ];  joint pain[  ];  back pain[  ];  Heme/Lymph: bruising[  ];  bleeding[  ];  anemia[  ];  Neuro: TIA[  ];  headaches[  ];  stroke[  ];  vertigo[  ];  seizures[  ];   paresthesias[  ];  difficulty walking[n  ]; with the medication for atrial fibrillation the patient had significant problems with tremor to the point he was unable to use hand tools or power this has improved lately  Psych:depression[  ]; anxiety[  ];  Endocrine: diabetes[  ];  thyroid dysfunction[  ];  Immunizations: Flu up to date [  ]; Pneumococcal up to date [  ];  Other:  Physical Exam: BP 168/102 mmHg  Pulse 85  Resp 20  Ht 6\' 2"  (1.88 m)  Wt 240 lb (108.863 kg)  BMI 30.80 kg/m2  SpO2 96%  PHYSICAL EXAMINATION: General appearance: alert, cooperative, appears stated age and no distress Head: Normocephalic, without obvious abnormality, atraumatic Neck: no adenopathy, no carotid bruit, no JVD, supple, symmetrical, trachea midline and thyroid not enlarged, symmetric, no tenderness/mass/nodules Lymph nodes: Cervical, supraclavicular, and axillary nodes normal. Resp: clear to auscultation bilaterally Back: symmetric, no curvature. ROM normal. No CVA tenderness. Cardio: regular rate and rhythm, S1, S2 normal, no murmur, click, rub or gallop GI: soft, non-tender; bowel sounds normal; no masses,  no organomegaly Extremities: extremities normal,  atraumatic, no cyanosis or edema and Homans sign is negative, no sign of DVT Neurologic: Grossly normal The tremor in the hands the patient previously had is now almost completely resolved per his report  Diagnostic Studies & Laboratory data:     Recent Radiology Findings:  CLINICAL DATA: Acute left-sided chest and back pain. History of aortic aneurysm.  EXAM: CT ANGIOGRAPHY CHEST, ABDOMEN AND PELVIS  TECHNIQUE: Multidetector CT imaging through the chest, abdomen and pelvis was performed using the standard protocol during bolus administration of intravenous contrast. Multiplanar reconstructed images and MIPs were obtained and reviewed to evaluate the vascular anatomy.  CONTRAST: 136mL OMNIPAQUE IOHEXOL 350 MG/ML SOLN  COMPARISON: CT scan of June 25, 2013.  FINDINGS: CTA CHEST FINDINGS  No pneumothorax or significant pleural effusion is noted. No acute pulmonary disease is noted. There is no evidence of thoracic aortic dissection. Great vessels are widely patent. 4.8 cm ascending thoracic aortic aneurysm is noted. No mediastinal mass or adenopathy is noted. Pulmonary arteries  appear normal. No significant osseous abnormality is noted in the chest.  Review of the MIP images confirms the above findings.  CTA ABDOMEN AND PELVIS FINDINGS  No gallstones are noted. No definite abnormality seen involving the liver, spleen or pancreas. Adrenal glands and kidneys appear normal. No hydronephrosis or renal obstruction is noted. No renal or ureteral calculi are noted. The appendix appears normal. There is no evidence of bowel obstruction. Atherosclerosis of abdominal aorta is noted without dissection. The celiac, superior mesenteric and renal arteries are widely patent. 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 3.1 cm infrarenal abdominal aortic aneurysm is noted. Iliac arteries are widely patent without significant stenosis. Moderate prostatic  enlargement is noted. Urinary bladder appears normal. 4.1 cm pseudo aneurysm with a large amount of mural thrombus is seen arising from a proximal branch of the proximal left superficial femoral artery. Multilevel degenerative disc disease is noted in the lumbar spine.  Review of the MIP images confirms the above findings.  IMPRESSION: No evidence of thoracic aortic dissection.  4.8 cm ascending thoracic aortic aneurysm is noted. Ascending thoracic aortic aneurysm. Recommend semi-annual imaging followup by CTA or MRA and referral to cardiothoracic surgery if not already obtained. This recommendation follows 2010 ACCF/AHA/AATS/ACR/ASA/SCA/SCAI/SIR/STS/SVM Guidelines for the Diagnosis and Management of Patients With Thoracic Aortic Disease. Circulation. 2010; [id number].  No evidence of abdominal aortic dissection. 3.1 cm infrarenal abdominal aortic aneurysm is noted.  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 There appears to be early filling of the left common femoral vein with contrast, most likely due to this pseudoaneurysm. Consultation with vascular surgery is recommended. Critical Value/emergent results were called by telephone at the time of interpretation on 07/28/2015 at 6:11 pm to Dr. Lisa Roca , who verbally acknowledged these results.   Electronically Signed  By: Marijo Conception, M.D.  On: 07/28/2015 18:13  I have independently reviewed the above radiology studies  and reviewed the findings with the patient.  ECHO: 08/15/2015 Study Conclusions  - Left ventricle: The cavity size was normal. There was mild concentric hypertrophy. Systolic function was normal. The estimated ejection fraction was in the range of 60% to 65%. Wall motion was normal; there were no regional wall motion abnormalities. Left ventricular diastolic function parameters were normal. - Aortic valve: There was  trivial regurgitation. - Mitral valve: There was mild regurgitation. - Left atrium: The atrium was normal in size. - Right ventricle: Systolic function was normal. - Pulmonary arteries: Systolic pressure was within the normal range.  Impressions:  - Normal study.   Recent Lab Findings: Lab Results  Component Value Date   WBC 5.8 07/28/2015   HGB 15.8 07/28/2015   HCT 47.2 07/28/2015   PLT 148* 07/28/2015   GLUCOSE 98 07/28/2015   CHOL 145 09/23/2015   TRIG 130.0 09/23/2015   HDL 44.90 09/23/2015   LDLCALC 74 09/23/2015   ALT 26 06/25/2013   AST 18 06/25/2013   NA 138 07/28/2015   K 4.1 07/28/2015   CL 103 07/28/2015   CREATININE 1.13 07/28/2015   BUN 19 07/28/2015   CO2 26 07/28/2015   INR 3.5 10/09/2015   Aortic Size Index=    4.7     /Body surface area is 2.38 meters squared. =1.97 < 2.75 cm/m2      4% risk per year 2.75 to 4.25          8% risk per year > 4.25 cm/m2  20% risk per year     Assessment / Plan:   Incidental finding of dilated ascending aorta without evidence of aortic insufficiency or bicuspid valve, I reviewed with patient the risks signs and symptoms of aortic dissection. With the current size surgical intervention is not indicated but close follow-up is needed, good control of blood pressure is recommended. I discussed with the patient not engage in excessive lifting such as competitive weightlifting, According to the 2010 ACC/AHA guidelines, we recommend patients with thoracic aortic disease to maintain a LDL of less than 70 and a HDL of greater than 50. We recommend their blood pressure to remain less than 135/85. The patient was educated to take lifelong prophylactic antibiotics prior to any elective invasive procedure. We'll plan to see the patient back for follow-up CT scan in 6 months after the previous scan Chol be late March 2017.    I  spent 40 minutes counseling the patient face to face and 50% or more the  time was spent in counseling  and coordination of care. The total time spent in the appointment was 60 minutes.  Grace Isaac MD      [address] [place], [address] Office [phone]   Beeper [phone]  10/09/2015 3:34 PM

## 2015-10-15 ENCOUNTER — Encounter: Payer: Self-pay | Admitting: Internal Medicine

## 2015-10-15 ENCOUNTER — Ambulatory Visit (INDEPENDENT_AMBULATORY_CARE_PROVIDER_SITE_OTHER): Payer: Medicare Other | Admitting: Internal Medicine

## 2015-10-15 VITALS — BP 138/78 | HR 59 | Temp 98.1°F | Resp 12 | Ht 73.0 in | Wt 243.5 lb

## 2015-10-15 DIAGNOSIS — L570 Actinic keratosis: Secondary | ICD-10-CM | POA: Diagnosis not present

## 2015-10-15 DIAGNOSIS — X32XXXA Exposure to sunlight, initial encounter: Secondary | ICD-10-CM | POA: Diagnosis not present

## 2015-10-15 DIAGNOSIS — I7121 Aneurysm of the ascending aorta, without rupture: Secondary | ICD-10-CM

## 2015-10-15 DIAGNOSIS — I10 Essential (primary) hypertension: Secondary | ICD-10-CM | POA: Diagnosis not present

## 2015-10-15 DIAGNOSIS — C44519 Basal cell carcinoma of skin of other part of trunk: Secondary | ICD-10-CM | POA: Diagnosis not present

## 2015-10-15 DIAGNOSIS — D485 Neoplasm of uncertain behavior of skin: Secondary | ICD-10-CM | POA: Diagnosis not present

## 2015-10-15 DIAGNOSIS — L738 Other specified follicular disorders: Secondary | ICD-10-CM | POA: Diagnosis not present

## 2015-10-15 DIAGNOSIS — E785 Hyperlipidemia, unspecified: Secondary | ICD-10-CM | POA: Diagnosis not present

## 2015-10-15 DIAGNOSIS — Z85828 Personal history of other malignant neoplasm of skin: Secondary | ICD-10-CM | POA: Diagnosis not present

## 2015-10-15 DIAGNOSIS — E669 Obesity, unspecified: Secondary | ICD-10-CM

## 2015-10-15 DIAGNOSIS — I712 Thoracic aortic aneurysm, without rupture: Secondary | ICD-10-CM

## 2015-10-15 MED ORDER — TRAMADOL HCL 50 MG PO TABS: 50.0000 mg | ORAL_TABLET | Freq: Four times a day (QID) | ORAL | Status: DC | PRN

## 2015-10-15 NOTE — Progress Notes (Signed)
Subjective:ot tolerating meds wihtou excessive fatiue.   Patient ID: David Rice, male    DOB: 06-16-1946  Age: 69 y.o. MRN: [id number]  CC: The primary encounter diagnosis was Essential hypertension. Diagnoses of Obesity, Ascending aortic aneurysm ([place]), and Hyperlipidemia were also pertinent to this visit.  HPI David Rice presents for follow up  On hypertension, fatigue and deconditioning secondary to atrial  fibrillation,  etc.    He has had an evaluation with CTS for monitoring  of thoracic aorta .  6 month follow up advised.  ldl < 70 and BP < 135/85  Energy not better,  Getting ready to start verapamil but tapering off of diltiazem.  Has noted DR in the 150's for the last several hours, not short of breatn  Has had evaluation by Dr Marya Landry.    BP and LDL near goal per recent labs   n  Outpatient Prescriptions Prior to Visit  Medication Sig Dispense Refill  . atorvastatin (LIPITOR) 10 MG tablet Take 10 mg by mouth daily.    Marland Kitchen esomeprazole (NEXIUM) 20 MG capsule Take 20 mg by mouth daily at 12 noon.    . Lactase (LACTAID PO) Take by mouth. Take 3 capsules daily.    Marland Kitchen loratadine (CLARITIN) 10 MG tablet Take 10 mg by mouth daily.    Marland Kitchen losartan (COZAAR) 25 MG tablet Take 25 mg by mouth daily.    . verapamil (CALAN) 120 MG tablet Take 1 tablet (120 mg total) by mouth 2 (two) times daily. 60 tablet 6  . warfarin (COUMADIN) 5 MG tablet Take as directed by [facility] 45 tablet 3  . diltiazem (CARDIZEM) 30 MG tablet Take one tablet by mouth once daily as needed for palpitations/ fast heart rate (Patient not taking: Reported on 10/15/2015) 30 tablet 3   No facility-administered medications prior to visit.    Review of Systems;  Patient denies headache, fevers, malaise, unintentional weight loss, skin rash, eye pain, sinus congestion and sinus pain, sore throat, dysphagia,  hemoptysis , cough, dyspnea, wheezing, chest pain, palpitations, orthopnea, edema, abdominal pain, nausea,  melena, diarrhea, constipation, flank pain, dysuria, hematuria, urinary  Frequency, nocturia, numbness, tingling, seizures,  Focal weakness, Loss of consciousness,  Tremor, insomnia, depression, anxiety, and suicidal ideation.      Objective:  BP 138/78 mmHg  Pulse 59  Temp(Src) 98.1 F (36.7 C) (Oral)  Resp 12  Ht 6\' 1"  (1.854 m)  Wt 243 lb 8 oz (110.451 kg)  BMI 32.13 kg/m2  SpO2 97%  BP Readings from Last 3 Encounters:  10/15/15 138/78  10/09/15 168/102  09/25/15 140/70    Wt Readings from Last 3 Encounters:  10/15/15 243 lb 8 oz (110.451 kg)  10/09/15 240 lb (108.863 kg)  09/25/15 240 lb (108.863 kg)    General appearance: alert, cooperative and appears stated age Ears: normal TM's and external ear canals both ears Throat: lips, mucosa, and tongue normal; teeth and gums normal Neck: no adenopathy, no carotid bruit, supple, symmetrical, trachea midline and thyroid not enlarged, symmetric, no tenderness/mass/nodules Back: symmetric, no curvature. ROM normal. No CVA tenderness. Lungs: clear to auscultation bilaterally Heart: regular rate and rhythm, S1, S2 normal, no murmur, click, rub or gallop Abdomen: soft, non-tender; bowel sounds normal; no masses,  no organomegaly Pulses: 2+ and symmetric Skin: Skin color, texture, turgor normal. No rashes or lesions Lymph nodes: Cervical, supraclavicular, and axillary nodes normal.  No results found for: HGBA1C  Lab Results  Component Value Date  CREATININE 1.13 07/28/2015   CREATININE 1.07 07/24/2015   CREATININE 1.16 06/25/2013    Lab Results  Component Value Date   WBC 5.8 07/28/2015   HGB 15.8 07/28/2015   HCT 47.2 07/28/2015   PLT 148* 07/28/2015   GLUCOSE 98 07/28/2015   CHOL 145 09/23/2015   TRIG 130.0 09/23/2015   HDL 44.90 09/23/2015   LDLCALC 74 09/23/2015   ALT 26 06/25/2013   AST 18 06/25/2013   NA 138 07/28/2015   K 4.1 07/28/2015   CL 103 07/28/2015   CREATININE 1.13 07/28/2015   BUN 19  07/28/2015   CO2 26 07/28/2015   INR 2.5 10/16/2015    No results found.  Assessment & Plan:   Problem List Items Addressed This Visit    Essential hypertension - Primary    Well controlled on current regimen. Renal function stable, no changes today. Lab Results  Component Value Date   CREATININE 1.13 07/28/2015   Lab Results  Component Value Date   NA 138 07/28/2015   K 4.1 07/28/2015   CL 103 07/28/2015   CO2 26 07/28/2015           Hyperlipidemia    Managed with low dose atorvastatin and low fat diet. .  Will return for fasting labs  Lab Results  Component Value Date   CHOL 145 09/23/2015   HDL 44.90 09/23/2015   LDLCALC 74 09/23/2015   TRIG 130.0 09/23/2015   CHOLHDL 3 09/23/2015           Ascending aortic aneurysm ([facility])    S/p evaluation by CTS.  follow up 6 months       Obesity    I have addressed  BMI and recommended wt loss of 10% of body weigh over the next 6 months using a low glycemic index diet and regular exercise a minimum of 5 days per week.           I am having Mr. Jochem start on traMADol. I am also having him maintain his Lactase (LACTAID PO), losartan, loratadine, esomeprazole, atorvastatin, diltiazem, warfarin, and verapamil.  Meds ordered this encounter  Medications  . traMADol (ULTRAM) 50 MG tablet    Sig: Take 1 tablet (50 mg total) by mouth every 6 (six) hours as needed.    Dispense:  90 tablet    Refill:  2   A total of 25 minutes of face to face time was spent with patient more than half of which was spent in counselling about the above mentioned conditions  and coordination of care  There are no discontinued medications.  Follow-up: Return in about 3 months (around 01/14/2016).   Crecencio Mc, MD

## 2015-10-15 NOTE — Patient Instructions (Addendum)
I am referring you for cardiopulmonary therapy (physical therapy for people with heart or lung conditions)   I am prescribing a medication for pain called tramadol.  It can be taken up to 4 times daily if needed.  Start with once or twice daily,  Can be added to tylenol.  Will not upset your stomach   This is  my version of a  "Low GI"  Diet:  It will still lower your blood sugars and allow you to lose 4 to 8  lbs  per month if you follow it carefully.  Your goal with exercise is a minimum of 30 minutes of aerobic exercise 5 days per week (Walking does not count once it becomes easy!)     All of the foods can be found at grocery stores and in bulk at [employer].  The Atkins protein bars and shakes are available in more varieties at [employer], [employer] and [employer].     7 AM Breakfast:  Choose from the following:  Low carbohydrate Protein  Shakes (I recommend the Premier  Protein,  EAS AdvantEdge "Carb Control" shakes  Or the low carb shakes by Atkins.    2.5 carbs   Arnold's "Sandwhich Thin"toasted  w/ peanut butter (no jelly: about 20 net carbs  "Bagel Thin" with cream cheese and salmon: about 20 carbs   a scrambled egg/bacon/cheese burrito made with Mission's "carb balance" whole wheat tortilla  (about 10 net carbs )  A slice of home made fritatta (egg based dish without a crust:  google it)    Avoid cereal and bananas, oatmeal and cream of wheat and grits. They are loaded with carbohydrates!   10 AM: high protein snack  Protein bar by Atkins (the snack size, under 200 cal, usually < 6 net carbs).    A stick of cheese:  Around 1 carb,  100 cal     Dannon Light n Fit [place] Yogurt  (80 cal, 8 carbs)  Other so called "protein bars" and Greek yogurts tend to be loaded with carbohydrates.  Remember, in food advertising, the word "energy" is synonymous for " carbohydrate."  Lunch:   A Sandwich using the bread choices listed, Can use any  Eggs,  lunchmeat, grilled meat or canned tuna), avocado,  regular mayo/mustard  and cheese.  A Salad using blue cheese, ranch,  Goddess or vinagrette,  No croutons or "confetti" and no "candied nuts" but regular nuts OK.   No pretzels or chips.  Pickles and miniature sweet peppers are a good low carb alternative that provide a "crunch"  The bread is the only source of carbohydrate in a sandwich and  can be decreased by trying some of these alternatives to traditional loaf bread:  Joseph's makes a pita bread and a flat bread (called "Lavash") that are 50 cal and 4 net carbs available at [employer]Js, [employer].  This can be toasted to use with hummous as well  Toufayan makes a low carb flatbread that's 100 cal and 9 net carbs available at [employer] and BJ's makes 2 sizes of  Low carb whole wheat tortilla  (The large one is 210 cal and 6 net carbs)  Flat Out makes flatbreads that are low carb as well  aa folded flatbread called "Foldit" available at [employer] "Low fat dressings, as well as [place] dressings They are loaded with sugar.  Ranch,  [employer], Goddess,  vinagrettes are  all fine   3 PM/ Mid day  Snack:  Consider  1 ounce of  almonds, walnuts, pistachios, pecans, peanuts,  Macadamia nuts or a nut medley.  Avoid "granola"; the dried cranberries and raisins are loaded with carbohydrates. Mixed nuts as long as there are no raisins,  cranberries or dried fruit.    Try the prosciutto/mozzarella cheese sticks by [employer]  In deli /backery section   High protein   To avoid overindulging in snacks: Try drinking a glass of almond coconut milk light by Silk ( Or a cup of coffee with your Atkins chocolate bar to keep you from having 3!!!   Pork rinds!  Yes Pork Rinds        6 PM  Dinner:     Meat/fowl/fish with a green salad, and either broccoli, cauliflower, green beans, spinach, brussel sprouts or  Lima beans. DO NOT BREAD THE PROTEIN!!      There is a low carb pasta by Dreamfield's that is acceptable  and tastes great: only 5 digestible carbs/serving.( All grocery stores but BJs carry it )  There are frozen dinners that are low carb:  Try Michel Angelo's chicken piccata or chicken or eggplant parm over low carb pasta.(Lowes and BJs)   Marjory Lies Sanchez's "Carnitas" (pulled pork, no sauce,  0 carbs) or his beef pot roast to make a dinner burrito (at BJ's)  Pesto over low carb pasta (bj's sells a good quality pesto in the center refrigerated section of the deli   Try satueeing  Cheral Marker with mushroooms  Whole wheat pasta is still full of digestible carbs and  Not as low in glycemic index as Dreamfield's.   Brown rice is still rice,  So skip the rice and noodles if you eat [place] or [place] (or at least limit to 1/2 cup)  9 PM snack :   Breyer's "low carb" fudgsicle or  ice cream bar (Carb Smart line), or  Weight Watcher's ice cream bar , or another "no sugar added" ice cream;  a serving of fresh berries/cherries with whipped cream   Cheese or DANNON'S LlGHT N FIT GREEK YOGURT or the [place] greek yogurt   8 ounces of [place] unsweetened almond/cococunut milk  Cheese and crackers (using WASA crackers,  They are low carb) or peanut butter on low carb crackers or pita bread     Avoid bananas, pineapple, grapes  and watermelon on a regular basis because they are high in sugar.  THINK OF THEM AS DESSERT  Remember that snack Substitutions should be less than 10 NET carbs per serving and meals should be < 25 net carbs. Remember that carbohydrates from fiber do not affect blood sugar, so you can  subtract fiber grams to get the "net carbs " of any particular food item.

## 2015-10-15 NOTE — Progress Notes (Signed)
Pre-visit discussion using our clinic review tool. No additional management support is needed unless otherwise documented below in the visit note.  

## 2015-10-16 ENCOUNTER — Ambulatory Visit (INDEPENDENT_AMBULATORY_CARE_PROVIDER_SITE_OTHER): Payer: Medicare Other | Admitting: *Deleted

## 2015-10-16 DIAGNOSIS — I4891 Unspecified atrial fibrillation: Secondary | ICD-10-CM | POA: Diagnosis not present

## 2015-10-16 LAB — POCT INR: INR: 2.5

## 2015-10-16 NOTE — Assessment & Plan Note (Signed)
S/p evaluation by CTS.  follow up 6 months

## 2015-10-16 NOTE — Assessment & Plan Note (Signed)
Managed with low dose atorvastatin and low fat diet. .  Will return for fasting labs  Lab Results  Component Value Date   CHOL 145 09/23/2015   HDL 44.90 09/23/2015   LDLCALC 74 09/23/2015   TRIG 130.0 09/23/2015   CHOLHDL 3 09/23/2015

## 2015-10-16 NOTE — Assessment & Plan Note (Signed)
I have addressed  BMI and recommended wt loss of 10% of body weigh over the next 6 months using a low glycemic index diet and regular exercise a minimum of 5 days per week.   

## 2015-10-16 NOTE — Assessment & Plan Note (Signed)
Well controlled on current regimen. Renal function stable, no changes today. Lab Results  Component Value Date   CREATININE 1.13 07/28/2015   Lab Results  Component Value Date   NA 138 07/28/2015   K 4.1 07/28/2015   CL 103 07/28/2015   CO2 26 07/28/2015    

## 2015-10-20 ENCOUNTER — Encounter: Payer: Self-pay | Admitting: Internal Medicine

## 2015-10-21 ENCOUNTER — Ambulatory Visit (INDEPENDENT_AMBULATORY_CARE_PROVIDER_SITE_OTHER): Payer: Medicare Other

## 2015-10-21 DIAGNOSIS — I4891 Unspecified atrial fibrillation: Secondary | ICD-10-CM | POA: Diagnosis not present

## 2015-10-21 LAB — POCT INR: INR: 3.2

## 2015-10-28 ENCOUNTER — Encounter: Payer: Self-pay | Admitting: Internal Medicine

## 2015-10-28 DIAGNOSIS — G622 Polyneuropathy due to other toxic agents: Secondary | ICD-10-CM

## 2015-10-28 DIAGNOSIS — Z9989 Dependence on other enabling machines and devices: Principal | ICD-10-CM

## 2015-10-28 DIAGNOSIS — G4733 Obstructive sleep apnea (adult) (pediatric): Secondary | ICD-10-CM

## 2015-10-30 ENCOUNTER — Ambulatory Visit (INDEPENDENT_AMBULATORY_CARE_PROVIDER_SITE_OTHER): Payer: Medicare Other

## 2015-10-30 DIAGNOSIS — I4891 Unspecified atrial fibrillation: Secondary | ICD-10-CM | POA: Diagnosis not present

## 2015-10-30 LAB — POCT INR: INR: 2.4

## 2015-10-31 DIAGNOSIS — I10 Essential (primary) hypertension: Secondary | ICD-10-CM | POA: Diagnosis not present

## 2015-10-31 DIAGNOSIS — I499 Cardiac arrhythmia, unspecified: Secondary | ICD-10-CM | POA: Diagnosis not present

## 2015-10-31 DIAGNOSIS — I724 Aneurysm of artery of lower extremity: Secondary | ICD-10-CM | POA: Diagnosis not present

## 2015-10-31 DIAGNOSIS — E785 Hyperlipidemia, unspecified: Secondary | ICD-10-CM | POA: Diagnosis not present

## 2015-10-31 DIAGNOSIS — M79609 Pain in unspecified limb: Secondary | ICD-10-CM | POA: Diagnosis not present

## 2015-11-04 DIAGNOSIS — N400 Enlarged prostate without lower urinary tract symptoms: Secondary | ICD-10-CM | POA: Diagnosis not present

## 2015-11-06 ENCOUNTER — Ambulatory Visit (INDEPENDENT_AMBULATORY_CARE_PROVIDER_SITE_OTHER): Payer: Medicare Other

## 2015-11-06 DIAGNOSIS — H348392 Tributary (branch) retinal vein occlusion, unspecified eye, stable: Secondary | ICD-10-CM | POA: Diagnosis not present

## 2015-11-06 DIAGNOSIS — I4891 Unspecified atrial fibrillation: Secondary | ICD-10-CM

## 2015-11-06 LAB — POCT INR: INR: 1.7

## 2015-11-26 ENCOUNTER — Ambulatory Visit: Payer: Medicare Other | Admitting: Cardiovascular Disease

## 2015-11-27 ENCOUNTER — Ambulatory Visit (INDEPENDENT_AMBULATORY_CARE_PROVIDER_SITE_OTHER): Payer: Medicare Other

## 2015-11-27 DIAGNOSIS — I4891 Unspecified atrial fibrillation: Secondary | ICD-10-CM | POA: Diagnosis not present

## 2015-11-27 LAB — POCT INR: INR: 2.1

## 2015-12-04 DIAGNOSIS — H348322 Tributary (branch) retinal vein occlusion, left eye, stable: Secondary | ICD-10-CM | POA: Diagnosis not present

## 2015-12-04 DIAGNOSIS — H2513 Age-related nuclear cataract, bilateral: Secondary | ICD-10-CM | POA: Diagnosis not present

## 2015-12-04 DIAGNOSIS — H59032 Cystoid macular edema following cataract surgery, left eye: Secondary | ICD-10-CM | POA: Diagnosis not present

## 2015-12-06 ENCOUNTER — Ambulatory Visit: Payer: Medicare Other | Admitting: Cardiovascular Disease

## 2015-12-09 ENCOUNTER — Ambulatory Visit (INDEPENDENT_AMBULATORY_CARE_PROVIDER_SITE_OTHER): Payer: Medicare Other | Admitting: Cardiovascular Disease

## 2015-12-09 ENCOUNTER — Encounter: Payer: Self-pay | Admitting: Cardiovascular Disease

## 2015-12-09 VITALS — BP 108/74 | HR 73 | Ht 74.0 in | Wt 243.0 lb

## 2015-12-09 DIAGNOSIS — I481 Persistent atrial fibrillation: Secondary | ICD-10-CM

## 2015-12-09 DIAGNOSIS — E785 Hyperlipidemia, unspecified: Secondary | ICD-10-CM

## 2015-12-09 DIAGNOSIS — I10 Essential (primary) hypertension: Secondary | ICD-10-CM | POA: Diagnosis not present

## 2015-12-09 DIAGNOSIS — I4891 Unspecified atrial fibrillation: Secondary | ICD-10-CM

## 2015-12-09 DIAGNOSIS — I4819 Other persistent atrial fibrillation: Secondary | ICD-10-CM

## 2015-12-09 DIAGNOSIS — I712 Thoracic aortic aneurysm, without rupture: Secondary | ICD-10-CM | POA: Diagnosis not present

## 2015-12-09 DIAGNOSIS — I7121 Aneurysm of the ascending aorta, without rupture: Secondary | ICD-10-CM

## 2015-12-09 NOTE — Assessment & Plan Note (Signed)
Blood pressure is well-controlled on losartan.  

## 2015-12-09 NOTE — Assessment & Plan Note (Signed)
Lab Results  Component Value Date   CHOL 145 09/23/2015   HDL 44.90 09/23/2015   LDLCALC 74 09/23/2015   TRIG 130.0 09/23/2015   CHOLHDL 3 09/23/2015   LDL was at target with small dose atorvastatin.

## 2015-12-09 NOTE — Progress Notes (Signed)
HPI  This is a pleasant 70 year old male who is here today for a follow-up visit regarding persistent atrial fibrillation and PVCs. He was diagnosed with atrial fibrillation in December 2015 in [place]. He was initially treated with atenolol and flecainide but did not tolerate the medications due to extreme fatigue. He ultimately underwent ablation in July of 2016.  He has known history of hypertension and hyperlipidemia. T readmill nuclear stress test in December 2015  was normal. There is no history of stroke or congestive heart failure. He is a lifelong nonsmoker and drinks occasional wine. 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 Delana Meyer in September.   48 hour Holter monitor in September 2016 showed frequent runs of atrial fibrillation with rapid ventricular response which correlated with the patient's reported events. He also had frequent PVCs with 20,000 beats in 48 hours.  Echocardiogram showed normal LV systolic function and no significant valvular abnormalities. CTA showed 4.8 ascending aortic aneurysm. The patient was seen by Dr. Pia Mau who is planning to follow the patient every 6 months. He was seen by Dr. Caryl Comes who prescribed small dose of diltiazem to be used as needed. However, the patient does not feel any difference when he takes a medication or he doesn't and thus he is not using it anymore. He obtained a Smart phone heart rate monitor which showed a relatively frequent episodes of atrial fibrillation but according to him these don't last long.     Allergies  Allergen Reactions  . Lactose Intolerance (Gi)      Current Outpatient Prescriptions on File Prior to Visit  Medication Sig Dispense Refill  . atorvastatin (LIPITOR) 10 MG tablet Take 10 mg by mouth daily.    Marland Kitchen esomeprazole (NEXIUM) 20 MG capsule Take 20 mg by mouth daily at 12 noon.    . Lactase (LACTAID PO) Take by mouth. Take 2 capsules daily.    Marland Kitchen loratadine  (CLARITIN) 10 MG tablet Take 10 mg by mouth daily.    Marland Kitchen losartan (COZAAR) 25 MG tablet Take 25 mg by mouth daily.    . traMADol (ULTRAM) 50 MG tablet Take 1 tablet (50 mg total) by mouth every 6 (six) hours as needed. 90 tablet 2  . warfarin (COUMADIN) 5 MG tablet Take as directed by [facility] 45 tablet 3   No current facility-administered medications on file prior to visit.     Past Medical History  Diagnosis Date  . A-fib ([place])   . Bowel obstruction ([place])   . Hearing impaired   . Diverticulitis   . Hypertension   . Thoracic aortic aneurysm without rupture ([place])   . Sleep apnea   . AAA (abdominal aortic aneurysm) ([place])   . Essential hypertension   . Hyperlipidemia   . Persistent atrial fibrillation [facility])     Status post ablation in July 2016 in [place].     Past Surgical History  Procedure Laterality Date  . Cardiac electrophysiology mapping and ablation    . Abdominal surgery    . Hernia repair    . Peripheral vascular catheterization N/A 08/02/2015    Procedure: Abdominal Aortogram w/Lower Extremity;  Surgeon: Katha Cabal, MD;  Location: [place] [facility];  Service: Cardiovascular;  Laterality: N/A;  . Peripheral vascular catheterization  08/02/2015    Procedure: Lower Extremity Intervention;  Surgeon: Katha Cabal, MD;  Location: [place] [facility];  Service: Cardiovascular;;  . Cardiac catheterization  11/2014    [facility]  [facility]     Family History  Problem Relation Age of Onset  . Hypertension Mother      Social History   Social History  . Marital Status: Single    Spouse Name: N/A  . Number of Children: N/A  . Years of Education: N/A   Occupational History  . Not on file.   Social History Main Topics  . Smoking status: Never Smoker   . Smokeless tobacco: Not on file  . Alcohol Use: 1.2 oz/week    2 Glasses of wine per week  . Drug Use: No  . Sexual Activity: Not on file   Other Topics Concern  . Not on file    Social History Narrative     ROS A 10 point review of system was performed. It is negative other than that mentioned in the history of present illness.   PHYSICAL EXAM   BP 108/74 mmHg  Pulse 73  Ht 6\' 2"  (1.88 m)  Wt 243 lb (110.224 kg)  BMI 31.19 kg/m2 Constitutional: He is oriented to person, place, and time. He appears well-developed and well-nourished. No distress.  HENT: No nasal discharge.  Head: Normocephalic and atraumatic.  Eyes: Pupils are equal and round.  No discharge. Neck: Normal range of motion. Neck supple. No JVD present. No thyromegaly present.  Cardiovascular: Normal rate, regular rhythm, normal heart sounds. Exam reveals no gallop and no friction rub. No murmur heard.  Pulmonary/Chest: Effort normal and breath sounds normal. No stridor. No respiratory distress. He has no wheezes. He has no rales. He exhibits no tenderness.  Abdominal: Soft. Bowel sounds are normal. He exhibits no distension. There is no tenderness. There is no rebound and no guarding.  Musculoskeletal: Normal range of motion. He exhibits no edema and no tenderness.  Neurological: He is alert and oriented to person, place, and time. Coordination normal.  Skin: Skin is warm and dry. No rash noted. He is not diaphoretic. No erythema. No pallor.  Psychiatric: He has a normal mood and affect. His behavior is normal. Judgment and thought content normal.       EKG: Normal sinus rhythm with frequent PACs  ASSESSMENT AND PLAN

## 2015-12-09 NOTE — Assessment & Plan Note (Signed)
The patient continues to have frequent episodes of atrial fibrillation but he reports that he feels better without medications. He also reports improvement in symptoms since he was switched to warfarin. Continue observation for now and follow-up with Dr. Caryl Comes as planned to see if there is an indication for antiarrhythmic medications.

## 2015-12-09 NOTE — Assessment & Plan Note (Signed)
This would be followed by Dr. Pia Mau every 6 months. Currently, there is no indication for surgery.

## 2015-12-09 NOTE — Patient Instructions (Signed)
Medication Instructions: Continue same medications.   Labwork: None.   Procedures/Testing: None.   Follow-Up: 1 year follow up with Dr. Fletcher Anon.  Keep follow up appointment with Dr. Caryl Comes next month.   Any Additional Special Instructions Will Be Listed Below (If Applicable).     If you need a refill on your cardiac medications before your next appointment, please call your pharmacy.

## 2015-12-10 ENCOUNTER — Other Ambulatory Visit: Payer: Self-pay | Admitting: *Deleted

## 2015-12-10 MED ORDER — ATORVASTATIN CALCIUM 10 MG PO TABS: 10.0000 mg | ORAL_TABLET | Freq: Every day | ORAL | Status: DC

## 2015-12-17 ENCOUNTER — Telehealth: Payer: Self-pay | Admitting: Internal Medicine

## 2015-12-17 NOTE — Telephone Encounter (Signed)
Pt sent a Mychart message. Pt wanted to know if he is due for Tetnus/Tdap? Thank you!

## 2015-12-17 NOTE — Telephone Encounter (Signed)
He is due for a tetanus/tdap.  He needs to contact his insurance to see if they will cover in our office or in a pharmacy.  Then he can schedule.

## 2015-12-18 ENCOUNTER — Ambulatory Visit (INDEPENDENT_AMBULATORY_CARE_PROVIDER_SITE_OTHER): Payer: Medicare Other

## 2015-12-18 DIAGNOSIS — I4891 Unspecified atrial fibrillation: Secondary | ICD-10-CM | POA: Diagnosis not present

## 2015-12-18 LAB — POCT INR: INR: 2.6

## 2015-12-20 ENCOUNTER — Ambulatory Visit (INDEPENDENT_AMBULATORY_CARE_PROVIDER_SITE_OTHER): Payer: Medicare Other | Admitting: Neurology

## 2015-12-20 ENCOUNTER — Encounter: Payer: Self-pay | Admitting: Neurology

## 2015-12-20 ENCOUNTER — Other Ambulatory Visit (INDEPENDENT_AMBULATORY_CARE_PROVIDER_SITE_OTHER): Payer: Medicare Other

## 2015-12-20 VITALS — BP 110/70 | HR 80 | Ht 74.0 in | Wt 243.0 lb

## 2015-12-20 DIAGNOSIS — G609 Hereditary and idiopathic neuropathy, unspecified: Secondary | ICD-10-CM

## 2015-12-20 LAB — VITAMIN B12: Vitamin B-12: 486 pg/mL (ref 211–911)

## 2015-12-20 LAB — TSH: TSH: 1.38 u[IU]/mL (ref 0.35–4.50)

## 2015-12-20 NOTE — Patient Instructions (Addendum)
Lab testing Start physical therapy gait training Return to clinic in 6 months

## 2015-12-20 NOTE — Progress Notes (Signed)
[facility] Clinic Note - Initial Visit   Date: 12/20/2015  David Rice MRN: [id number] DOB: 1946/01/03   Dear Dr. Derrel Nip:  Thank you for your kind referral of David Rice for consultation of neuropathy. Although his history is well known to you, please allow [place] to reiterate it for the purpose of our medical record. The patient was accompanied to the clinic by self.   History of Present Illness: David Rice is a 70 y.o. right-handed Caucasian male with atrial fibrillation on coumadin, ascending aortic aneurysm, hypertension, hyperlipidemia, and GERD presenting for evaluation of neuropathy.    He moved to the area in June 2016 and was previously seeing Dr. Sheron Nightingale, MD Neurologist Specialist in [place], [place].  Starting around 2006, he began experiencing numbness at the sole of the feet which started on the left foot and progressed to the right.  He denies weakness. He has sharp pain in the soles of the feet especially at night. The numbness only involves the toes on the right foot and the entire sole of the left foot.  His symptoms have not extended to involve the top of the feet or hands.   Exercises helps his legs, but since his diagnosis of atrial fibrillation, he is unable to stay as active.  He had NCS/EMG of the legs which showed neuropathy (not available to review personally) and lab testing which was normal.   He complains of generalized malaise and attributes this to atrial fibrillation and the medications used for rate-control, so has discontinued the medications and now only on coumadin.    No personal or family history of diabetes.  No history of heavy alcohol use.   Out-side paper records, electronic medical record, and images have been reviewed where available and summarized as:  Lab Results  Component Value Date   CHOL 145 09/23/2015   HDL 44.90 09/23/2015   LDLCALC 74 09/23/2015   TRIG 130.0 09/23/2015   CHOLHDL 3 09/23/2015       Past Medical History  Diagnosis Date  . A-fib ([place])   . Bowel obstruction ([place])   . Hearing impaired   . Diverticulitis   . Hypertension   . Thoracic aortic aneurysm without rupture ([place])   . Sleep apnea   . AAA (abdominal aortic aneurysm) ([place])   . Essential hypertension   . Hyperlipidemia   . Persistent atrial fibrillation [facility])     Status post ablation in July 2016 in [place].    Past Surgical History  Procedure Laterality Date  . Cardiac electrophysiology mapping and ablation    . Abdominal surgery    . Hernia repair    . Peripheral vascular catheterization N/A 08/02/2015    Procedure: Abdominal Aortogram w/Lower Extremity;  Surgeon: Katha Cabal, MD;  Location: [facility];  Service: Cardiovascular;  Laterality: N/A;  . Peripheral vascular catheterization  08/02/2015    Procedure: Lower Extremity Intervention;  Surgeon: Katha Cabal, MD;  Location: [facility];  Service: Cardiovascular;;  . Cardiac catheterization  11/2014    [place]     Medications:  Outpatient Encounter Prescriptions as of 12/20/2015  Medication Sig  . atorvastatin (LIPITOR) 10 MG tablet Take 1 tablet (10 mg total) by mouth daily.  Marland Kitchen esomeprazole (NEXIUM) 20 MG capsule Take 20 mg by mouth daily at 12 noon.  . Lactase (LACTAID PO) Take by mouth. Take 2 capsules daily.  Marland Kitchen loratadine (CLARITIN) 10 MG tablet Take 10  mg by mouth daily.  Marland Kitchen losartan (COZAAR) 25 MG tablet Take 25 mg by mouth daily.  . traMADol (ULTRAM) 50 MG tablet Take 1 tablet (50 mg total) by mouth every 6 (six) hours as needed.  . warfarin (COUMADIN) 5 MG tablet Take as directed by Coumadin Clinic   No facility-administered encounter medications on file as of 12/20/2015.     Allergies:  Allergies  Allergen Reactions  . Lactose Intolerance (Gi)     Family History: Family History  Problem Relation Age of Onset  . Hypertension Mother     Living 87  . Lung cancer Father      Deceased, 74  . Hypertension Brother   . Healthy Son   . Healthy Daughter     Social History: Social History  Substance Use Topics  . Smoking status: Never Smoker   . Smokeless tobacco: Not on file  . Alcohol Use: 1.2 oz/week    2 Glasses of wine per week     Comment: 2 glasses of wine aabout 3 times per week   Social History   Social History Narrative   He lives alone, he is engaged.    He is a retired Engineer, maintenance (IT) for 40 years.       Review of Systems:  CONSTITUTIONAL: No fevers, chills, night sweats, or weight loss.   EYES: No visual changes or eye pain ENT: No hearing changes.  No history of nose bleeds.   RESPIRATORY: No cough, wheezing and shortness of breath.   CARDIOVASCULAR: Negative for chest pain, and palpitations.   GI: Negative for abdominal discomfort, blood in stools or black stools.  No recent change in bowel habits.   GU:  No history of incontinence.   MUSCLOSKELETAL: No history of joint pain or swelling.  No myalgias.   SKIN: Negative for lesions, rash, and itching.   HEMATOLOGY/ONCOLOGY: Negative for prolonged bleeding, bruising easily, and swollen nodes.  No history of cancer.   ENDOCRINE: Negative for cold or heat intolerance, polydipsia or goiter.   PSYCH:  No depression +anxiety symptoms.   NEURO: As Above.   Vital Signs:  BP 110/70 mmHg  Pulse 80  Ht 6\' 2"  (1.88 m)  Wt 243 lb (110.224 kg)  BMI 31.19 kg/m2   General Medical Exam:   General:  Well appearing, comfortable.   Eyes/ENT: see cranial nerve examination.   Neck: No masses appreciated.  Full range of motion without tenderness.  No carotid bruits. Respiratory:  Clear to auscultation, good air entry bilaterally.   Cardiac:  Irregularly irregular, no murmur.   Extremities:  No deformities, edema, or skin discoloration.  Skin:  No rashes or lesions.  Neurological Exam: MENTAL STATUS including orientation to time, place, person, recent and remote memory, attention span and concentration,  language, and fund of knowledge is normal.  Speech is not dysarthric.  CRANIAL NERVES: II:  No visual field defects.  Unremarkable fundi.   III-IV-VI: Pupils equal round and reactive to light.  Normal conjugate, extra-ocular eye movements in all directions of gaze.  No nystagmus.  No ptosis.   V:  Normal facial sensation.    VII:  Normal facial symmetry and movements.  No pathologic facial reflexes.  VIII:  Normal hearing and vestibular function.   IX-X:  Normal palatal movement.   XI:  Normal shoulder shrug and head rotation.   XII:  Normal tongue strength and range of motion, no deviation or fasciculation.  MOTOR:  No atrophy, fasciculations or abnormal movements.  No  pronator drift.  Tone is normal.    Right Upper Extremity:    Left Upper Extremity:    Deltoid  5/5   Deltoid  5/5   Biceps  5/5   Biceps  5/5   Triceps  5/5   Triceps  5/5   Wrist extensors  5/5   Wrist extensors  5/5   Wrist flexors  5/5   Wrist flexors  5/5   Finger extensors  5/5   Finger extensors  5/5   Finger flexors  5/5   Finger flexors  5/5   Dorsal interossei  5/5   Dorsal interossei  5/5   Abductor pollicis  5/5   Abductor pollicis  5/5   Tone ([place]worth scale)  0  Tone (Ashworth scale)  0   Right Lower Extremity:    Left Lower Extremity:    Hip flexors  5/5   Hip flexors  5/5   Hip extensors  5/5   Hip extensors  5/5   Knee flexors  5/5   Knee flexors  5/5   Knee extensors  5/5   Knee extensors  5/5   Dorsiflexors  5/5   Dorsiflexors  5/5   Plantarflexors  5/5   Plantarflexors  5/5   Toe extensors  5/5   Toe extensors  5/5   Toe flexors  5/5   Toe flexors  5/5   Tone (Ashworth scale)  0  Tone (Ashworth scale)  0   MSRs:  Right                                                                 Left brachioradialis 2+  brachioradialis 2+  biceps 2+  biceps 2+  triceps 2+  triceps 2+  patellar 2+  patellar 2+  ankle jerk 0  ankle jerk 0  Hoffman no  Hoffman no  plantar response down  plantar response  down   SENSORY:  Reduced sensation to all modalities distal to ankles.  Sensation intact in the upper extremities.  Romberg's sign present.   COORDINATION/GAIT: Normal finger-to- nose-finger and heel-to-shin.  Intact rapid alternating movements bilaterally.  Able to rise from a chair without using arms.  Gait narrow based and stable.  Unsteady with tandem gait.   IMPRESSION: Mr. Rehmer is a 70 year-old gentleman referred for evaluation of bilateral feet paresthesias.  Clinically, he has distal paresthesias and mild sensory ataxia consistent with a large fiber peripheral neuropathy.  He had had NCS/EMG of the legs, but I do not have these to review.  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  I had extensive discussion with the patient regarding the pathogenesis, etiology, management, and natural course of neuropathy. Neuropathy tends to be slowly progressive, especially if a treatable etiology is not identified; in the vast majority of cases, despite checking for reversible causes, we are unable to find the underlying etiology and management is symptomatic.    PLAN/RECOMMENDATIONS:  1.  Check TSH, vitamin B12, and vitamin B1, copper 2.  Start physical therapy for gait training  3.  Request records from Dr. Sheron Nightingale, MD Neurologist Specialist in [place], [place]   Return to clinic in 6 months.   The duration of this appointment visit was 50 minutes of  face-to-face time with the patient.  Greater than 50% of this time was spent in counseling, explanation of diagnosis, planning of further management, and coordination of care.   Thank you for allowing me to participate in patient's care.  If I can answer any additional questions, I would be pleased to do so.    Sincerely,    Zayden Hahne K. Posey Pronto, DO

## 2015-12-24 LAB — COPPER, SERUM: Copper: 98 ug/dL (ref 70–175)

## 2015-12-25 LAB — VITAMIN B1: Vitamin B1 (Thiamine): 18 nmol/L (ref 8–30)

## 2015-12-26 ENCOUNTER — Encounter: Payer: Self-pay | Admitting: Internal Medicine

## 2015-12-26 ENCOUNTER — Other Ambulatory Visit
Admission: RE | Admit: 2015-12-26 | Discharge: 2015-12-26 | Disposition: A | Discharge status: Home / Self Care | Payer: Medicare Other | Source: Ambulatory Visit | Attending: Internal Medicine | Admitting: Internal Medicine

## 2015-12-26 ENCOUNTER — Telehealth: Payer: Self-pay | Admitting: *Deleted

## 2015-12-26 ENCOUNTER — Ambulatory Visit (INDEPENDENT_AMBULATORY_CARE_PROVIDER_SITE_OTHER): Payer: Medicare Other | Admitting: Internal Medicine

## 2015-12-26 VITALS — BP 100/80 | HR 81 | Ht 74.0 in | Wt 243.5 lb

## 2015-12-26 DIAGNOSIS — I481 Persistent atrial fibrillation: Secondary | ICD-10-CM | POA: Diagnosis not present

## 2015-12-26 DIAGNOSIS — I4891 Unspecified atrial fibrillation: Secondary | ICD-10-CM

## 2015-12-26 LAB — BASIC METABOLIC PANEL
ANION GAP: 6 (ref 5–15)
BUN: 25 mg/dL — ABNORMAL HIGH (ref 6–20)
CALCIUM: 9.3 mg/dL (ref 8.9–10.3)
CO2: 30 mmol/L (ref 22–32)
Chloride: 102 mmol/L (ref 101–111)
Creatinine, Ser: 1.32 mg/dL — ABNORMAL HIGH (ref 0.61–1.24)
GFR, EST NON AFRICAN AMERICAN: 53 mL/min — AB (ref >60)
GLUCOSE: 119 mg/dL — AB (ref 65–99)
Potassium: 4.3 mmol/L (ref 3.5–5.1)
Sodium: 138 mmol/L (ref 135–145)

## 2015-12-26 LAB — MAGNESIUM: MAGNESIUM: 2.2 mg/dL (ref 1.7–2.4)

## 2015-12-26 NOTE — Patient Instructions (Addendum)
Medication Instructions: - Stop losartan- continue to monitor your blood pressures at home  Labwork: - Your physician recommends that you have lab work today:BMET/ [place]- 1st desk on the right when you enter (registration).  Procedures/Testing: - We will arrange admission for Tikosyn loading for you- Sally/ Megan our Pharmacist for our practice, will be in touch with you to schedule.  Follow-Up: - 4 weeks post Tikosyn initiation (TBD)   Any Additional Special Instructions Will Be Listed Below (If Applicable).     If you need a refill on your cardiac medications before your next appointment, please call your pharmacy.

## 2015-12-26 NOTE — Telephone Encounter (Signed)
Message sent through my chart

## 2015-12-26 NOTE — Progress Notes (Signed)
Patient Care Team: Crecencio Mc, MD as PCP - General (Internal Medicine)   HPI  David Rice is a 70 y.o. male  seen in follow-up for atrial fibrillation  With PVCs in the context of sinus bradycardia ; he has treated his sleep apnea   He has a history of atrial fibrillation diagnosed apparently 12/15. Therapies with atenolol and flecainide were not tolerated and he underwent catheter ablation 7/16. This was complicated by a left femoral pseudoaneurysm and he underwent coiling in July of this year.  Records and Results Reviewed stress Myoview report from 2015 at [facility] was normal   Antiarrhythmics Date  flecainide 2016/   propafenone 2016 /        Past Medical History  Diagnosis Date  . A-fib ([place])   . Bowel obstruction ([place])   . Hearing impaired   . Diverticulitis   . Hypertension   . Thoracic aortic aneurysm without rupture ([place])   . Sleep apnea   . AAA (abdominal aortic aneurysm) ([place])   . Essential hypertension   . Hyperlipidemia   . Persistent atrial fibrillation [facility])     Status post ablation in July 2016 in [place].    Past Surgical History  Procedure Laterality Date  . Cardiac electrophysiology mapping and ablation    . Abdominal surgery    . Hernia repair    . Peripheral vascular catheterization N/A 08/02/2015    Procedure: Abdominal Aortogram w/Lower Extremity;  Surgeon: Katha Cabal, MD;  Location: [place];  Service: Cardiovascular;  Laterality: N/A;  . Peripheral vascular catheterization  08/02/2015    Procedure: Lower Extremity Intervention;  Surgeon: Katha Cabal, MD;  Location: [facility];  Service: Cardiovascular;;  . Cardiac catheterization  11/2014    [place]    Current Outpatient Prescriptions  Medication Sig Dispense Refill  . atorvastatin (LIPITOR) 10 MG tablet Take 1 tablet (10 mg total) by mouth daily. 90 tablet 3  . diphenhydrAMINE (SOMINEX) 25 MG tablet Take 25  mg by mouth at bedtime as needed for sleep.    Marland Kitchen esomeprazole (NEXIUM) 20 MG capsule Take 20 mg by mouth daily at 12 noon.    . Lactase (LACTAID PO) Take by mouth. Take 2 capsules daily.    Marland Kitchen loratadine (CLARITIN) 10 MG tablet Take 10 mg by mouth daily.    Marland Kitchen losartan (COZAAR) 25 MG tablet Take 25 mg by mouth daily.    . traMADol (ULTRAM) 50 MG tablet Take 1 tablet (50 mg total) by mouth every 6 (six) hours as needed. 90 tablet 2  . warfarin (COUMADIN) 5 MG tablet Take as directed by Coumadin Clinic 45 tablet 3   No current facility-administered medications for this visit.    Allergies  Allergen Reactions  . Lactose Intolerance (Gi)       Review of Systems negative except from HPI and PMH  Physical Exam Ht 6\' 2"  (1.88 m)  Wt 243 lb 8 oz (110.451 kg)  BMI 31.25 kg/m2 Well developed and well nourished in no acute distress HENT normal E scleral and icterus clear Neck Supple JVP flat; carotids brisk and full Clear to ausculation Regular rate and rhythm, no murmurs gallops or rub Soft with active bowel sounds No clubbing cyanosis { Edema Alert and oriented, grossly normal motor and sensory function Skin Warm and Dry  ECG demonstrates sinus rhythm with frequent PACs and occasional PVCs Rate 79 Intervals 21/07/40  Assessment  and  Plan  Atrial fibrillation-paroxysmal  PVCs-frequent  Sinus bradycardia  History of atrial fibrillation ablation  Obstructive sleep apnea-treated  He continues with symptomatic palpitations both atrial fibrillation and PVCs as documented by his [employer]  We discussed treatment options. At this juncture, he is notParticularly enthusiastic about repeat catheter ablation; it is comment that it made things worse.  We have thus decided to admit him for the use of dofetilide;  Side effects and proarrhythmia reviewed

## 2015-12-31 ENCOUNTER — Telehealth: Payer: Self-pay | Admitting: Pharmacist

## 2015-12-31 ENCOUNTER — Encounter: Payer: Self-pay | Admitting: Pharmacist

## 2015-12-31 NOTE — Telephone Encounter (Signed)
Received message from Dr. Caryl Comes that pt is to be started on Tikosyn. Will need 4 weekly INRs beforehand. Sees Erika in [place] [facility]mad[facility] clinic. Advised pt to stop his Benadryl since this is contraindicated with Tikosyn. Scheduled him in [facility] clinic on 3/20 for Tikosyn initiation assuming next 4 weekly INRs are in range.

## 2016-01-06 ENCOUNTER — Encounter: Payer: Self-pay | Admitting: Physical Therapy

## 2016-01-08 ENCOUNTER — Ambulatory Visit (INDEPENDENT_AMBULATORY_CARE_PROVIDER_SITE_OTHER): Payer: Medicare Other | Admitting: *Deleted

## 2016-01-08 DIAGNOSIS — I4891 Unspecified atrial fibrillation: Secondary | ICD-10-CM

## 2016-01-08 LAB — POCT INR: INR: 2.1

## 2016-01-10 ENCOUNTER — Other Ambulatory Visit: Payer: Self-pay | Admitting: Cardiothoracic Surgery

## 2016-01-10 DIAGNOSIS — I712 Thoracic aortic aneurysm, without rupture: Secondary | ICD-10-CM

## 2016-01-10 DIAGNOSIS — I7121 Aneurysm of the ascending aorta, without rupture: Secondary | ICD-10-CM

## 2016-01-14 ENCOUNTER — Ambulatory Visit: Payer: Medicare Other | Admitting: Internal Medicine

## 2016-01-15 ENCOUNTER — Ambulatory Visit (INDEPENDENT_AMBULATORY_CARE_PROVIDER_SITE_OTHER): Payer: Medicare Other

## 2016-01-15 DIAGNOSIS — I4891 Unspecified atrial fibrillation: Secondary | ICD-10-CM

## 2016-01-15 LAB — POCT INR: INR: 2

## 2016-01-16 ENCOUNTER — Encounter: Payer: Self-pay | Admitting: Internal Medicine

## 2016-01-16 ENCOUNTER — Ambulatory Visit (INDEPENDENT_AMBULATORY_CARE_PROVIDER_SITE_OTHER): Payer: Medicare Other | Admitting: Internal Medicine

## 2016-01-16 VITALS — BP 118/60 | HR 62 | Temp 98.0°F | Resp 12 | Ht 74.0 in | Wt 243.5 lb

## 2016-01-16 DIAGNOSIS — R0609 Other forms of dyspnea: Secondary | ICD-10-CM

## 2016-01-16 DIAGNOSIS — J9611 Chronic respiratory failure with hypoxia: Secondary | ICD-10-CM | POA: Diagnosis not present

## 2016-01-16 DIAGNOSIS — G4733 Obstructive sleep apnea (adult) (pediatric): Secondary | ICD-10-CM

## 2016-01-16 DIAGNOSIS — Z9989 Dependence on other enabling machines and devices: Secondary | ICD-10-CM

## 2016-01-16 DIAGNOSIS — J3089 Other allergic rhinitis: Secondary | ICD-10-CM

## 2016-01-16 DIAGNOSIS — K635 Polyp of colon: Secondary | ICD-10-CM | POA: Diagnosis not present

## 2016-01-16 MED ORDER — MOMETASONE FUROATE 50 MCG/ACT NA SUSP: 2.0000 | Freq: Two times a day (BID) | NASAL | Status: DC

## 2016-01-16 NOTE — Patient Instructions (Addendum)
Trial of azelastine nasal spray ,  It is an antihistamine spray   Continue flushing your sinuses  I will change your flonase to a different steroid spray called nasonex  As long as you continue nasonex daily  You can use Afrin as needed  For sinus congestion     Bring [place] your sleep study results and your paperwork from [place] so we can figure out what is needed   Referral to [facility] Card[facility] is in process

## 2016-01-16 NOTE — Progress Notes (Signed)
Patient 02 sat on room air while sitting 94%. Walked patient for 1 minute on room air patient sat dropped to 89% after 3 minutes of walking 02 sat dropped to 78%. Applied 02 at 2L patient 02 sat rose to 97%, walked patient for 5 minutes with 2L 02 sat remained at 94%.

## 2016-01-16 NOTE — Assessment & Plan Note (Addendum)
Ambulatory saturations on room air dropped markedly low (see report).  Ordering portable 02 by [place].  Will send for pulmonary function tests to rule restrictive disease/. Cardiopulmonary rehab also ordered.

## 2016-01-16 NOTE — Progress Notes (Signed)
Pre-visit discussion using our clinic review tool. No additional management support is needed unless otherwise documented below in the visit note.  

## 2016-01-16 NOTE — Progress Notes (Signed)
Subjective:  Patient ID: David Rice, male    DOB: 10-01-1946  Age: 70 y.o. MRN: [id number]  CC: The primary encounter diagnosis was Exertional dyspnea. Diagnoses of Colon polyp, OSA on CPAP, Chronic respiratory failure with hypoxia ([place]), and Other allergic rhinitis were also pertinent to this visit.  HPI David Rice presents for follow up on chronic conditions.  Patient is  EXTREMELY HOH DESPiTE USE OF HEARING AIDS.  SHOUTING IS REQUIRED ,  Dyspnea:  Continues to report shortness  of breath with minimal exertion /walking, used to be very physically active until the atrial fib. Marland Kitchen Discussed getting cardioPT.  Ambulatory sats today were markedly low and responded to supplemental 02  Need for colonoscopy?  Had 10 inch resection for a benign polyp in  early 2015 and was told that a repeat colonoscopy would be due in 2020.  The colonoscopy and surgery were both done at  [facility],  GI  David Rice . Gen Surgeon David Rice    OSA:  Has had trouble getting supplies covered by insurance.  Uses [employer].  Sleep apnea diagnosed in Feb 2016.  And his CPAP machine is 70 year old.  study feb 2016 done at [facility] in [place]?  Has a   Medicare keeps rejecting the charges for medicare   Congestion:  Takes claritin nightly ,  Needs a decongestant bc he feels his ears are stopping up with fluid L > R   Takes Tikosyn .  So bendadryl was C/I and .stopped .  Using flonase but makes eyes itchy and dry.    BP meds stopped by David Rice .   Neurology has referred him to  PT for balance    Outpatient Prescriptions Prior to Visit  Medication Sig Dispense Refill  . atorvastatin (LIPITOR) 10 MG tablet Take 1 tablet (10 mg total) by mouth daily. 90 tablet 3  . esomeprazole (NEXIUM) 20 MG capsule Take 20 mg by mouth daily at 12 noon.    . Lactase (LACTAID PO) Take by mouth. Take 2 capsules daily.    Marland Kitchen loratadine (CLARITIN) 10 MG tablet Take 10 mg by mouth  daily.    . silodosin (RAPAFLO) 8 MG CAPS capsule Take 8 mg by mouth at bedtime.    . traMADol (ULTRAM) 50 MG tablet Take 1 tablet (50 mg total) by mouth every 6 (six) hours as needed. 90 tablet 2  . warfarin (COUMADIN) 5 MG tablet Take as directed by Coumadin Clinic 45 tablet 3   No facility-administered medications prior to visit.    Review of Systems;  Patient denies headache, fevers, malaise, unintentional weight loss, skin rash, eye pain, sinus congestion and sinus pain, sore throat, dysphagia,  hemoptysis , cough, dyspnea, wheezing, chest pain, palpitations, orthopnea, edema, abdominal pain, nausea, melena, diarrhea, constipation, flank pain, dysuria, hematuria, urinary  Frequency, nocturia, numbness, tingling, seizures,  Focal weakness, Loss of consciousness,  Tremor, insomnia, depression, anxiety, and suicidal ideation.      Objective:  BP 118/60 mmHg  Pulse 62  Temp(Src) 98 F (36.7 C) (Oral)  Resp 12  Ht 6\' 2"  (1.88 m)  Wt 243 lb 8 oz (110.451 kg)  BMI 31.25 kg/m2  SpO2 94%  BP Readings from Last 3 Encounters:  01/16/16 118/60  12/26/15 100/80  12/20/15 110/70    Wt Readings from Last 3 Encounters:  01/16/16 243 lb 8 oz (110.451 kg)  12/26/15 243 lb 8 oz (110.451 kg)  12/20/15 243 lb (110.224 kg)    General appearance: alert, cooperative and appears stated age Ears: normal TM's and external ear canals both ears.   Throat: lips, mucosa, and tongue normal; teeth and gums normal Neck: no adenopathy, no carotid bruit, supple, symmetrical, trachea midline and thyroid not enlarged, symmetric, no tenderness/mass/nodules Back: symmetric, no curvature. ROM normal. No CVA tenderness. Lungs: clear to auscultation bilaterally Heart: regular rate and rhythm, S1, S2 normal, no murmur, click, rub or gallop Abdomen: soft, non-tender; bowel sounds normal; no masses,  no organomegaly Pulses: 2+ and symmetric Skin: Skin color, texture, turgor normal. No rashes or lesions Lymph  nodes: Cervical, supraclavicular, and axillary nodes normal.  No results found for: HGBA1C  Lab Results  Component Value Date   CREATININE 1.32* 12/26/2015   CREATININE 1.13 07/28/2015   CREATININE 1.07 07/24/2015    Lab Results  Component Value Date   WBC 5.8 07/28/2015   HGB 15.8 07/28/2015   HCT 47.2 07/28/2015   PLT 148* 07/28/2015   GLUCOSE 119* 12/26/2015   CHOL 145 09/23/2015   TRIG 130.0 09/23/2015   HDL 44.90 09/23/2015   LDLCALC 74 09/23/2015   ALT 26 06/25/2013   AST 18 06/25/2013   NA 138 12/26/2015   K 4.3 12/26/2015   CL 102 12/26/2015   CREATININE 1.32* 12/26/2015   BUN 25* 12/26/2015   CO2 30 12/26/2015   TSH 1.38 12/20/2015   INR 2.0 01/15/2016    No results found.  Assessment & Plan:   Problem List Items Addressed This Visit    OSA on CPAP    Diagnosed with sleep study in 2015.   Patient is using CPAP every night a minimum of 6 hours per night and notes improved daytime wakefulness and decreased fatigue .  I will assume responsibility for getting patient's refills      Chronic respiratory failure with hypoxia ([place])    Ambulatory saturations on room air dropped markedly low (see report).  Ordering portable 02 by [place].  Will send for pulmonary function tests to rule restrictive disease/. Cardiopulmonary rehab also ordered.       Relevant Orders   For home use only DME oxygen   Pulmonary Function Test [facility] Only   Other allergic rhinitis    Continue claritin.  Adding azelastine. nasonex and prn afrin       Colon polyp    Other Visit Diagnoses    Exertional dyspnea    -  Primary    Relevant Orders    Ambulatory referral to Physical Therapy    For home use only DME oxygen    Pulmonary Function Test [facility] Only       I am having David Rice start on mometasone. I am also having him maintain his Lactase (LACTAID PO), loratadine, esomeprazole, warfarin, traMADol, atorvastatin, and silodosin.  Meds ordered this encounter  Medications  .  mometasone (NASONEX) 50 MCG/ACT nasal spray    Sig: Place 2 sprays into the nose 2 (two) times daily.    Dispense:  17 g    Refill:  12    There are no discontinued medications.  Follow-up: No Follow-up on file.   Crecencio Mc, MD

## 2016-01-16 NOTE — Assessment & Plan Note (Signed)
Continue claritin.  Adding azelastine. nasonex and prn afrin

## 2016-01-16 NOTE — Assessment & Plan Note (Addendum)
Diagnosed with sleep study in 2015.   Patient is using CPAP every night a minimum of 6 hours per night and notes improved daytime wakefulness and decreased fatigue .  I will assume responsibility for getting patient's refills

## 2016-01-20 ENCOUNTER — Telehealth: Payer: Self-pay | Admitting: Neurology

## 2016-01-20 ENCOUNTER — Encounter: Payer: Self-pay | Admitting: Physical Therapy

## 2016-01-20 ENCOUNTER — Telehealth: Payer: Self-pay | Admitting: Internal Medicine

## 2016-01-20 ENCOUNTER — Ambulatory Visit: Payer: Medicare Other | Attending: Neurology | Admitting: Physical Therapy

## 2016-01-20 DIAGNOSIS — Z9989 Dependence on other enabling machines and devices: Principal | ICD-10-CM

## 2016-01-20 DIAGNOSIS — Z9181 History of falling: Secondary | ICD-10-CM | POA: Diagnosis not present

## 2016-01-20 DIAGNOSIS — R531 Weakness: Secondary | ICD-10-CM | POA: Insufficient documentation

## 2016-01-20 DIAGNOSIS — R269 Unspecified abnormalities of gait and mobility: Secondary | ICD-10-CM | POA: Insufficient documentation

## 2016-01-20 DIAGNOSIS — G4733 Obstructive sleep apnea (adult) (pediatric): Secondary | ICD-10-CM

## 2016-01-20 NOTE — Telephone Encounter (Signed)
Records rec'd from Dr. Sheron Nightingale office dated 09/25/2014 and 12/11/2014) and summarized:  NCS/EMG 10/30/2014:  Mild, distal, chronic, axonal sensorimotor polyneuropathy. Labs 10/2014:  CMP, ESR, TSH, vitamin B12, [place], anti-CV2, anti-Hu, SPEP/UPEP with IFE, SSA/B, folate, RF, vitamin B6, ANA, vitamin B1, copper, ACE, HIV, MMA, HbA1c all normal  He was being managed symptomatically for idiopathic peripheral neuropathy and OSA.  Luverta Korte K. Posey Pronto, DO

## 2016-01-20 NOTE — Telephone Encounter (Signed)
Pt dropped off paper work for you. Pt said you requested this paper work for his c-pap machine. Will hand deliver to you.

## 2016-01-20 NOTE — Patient Instructions (Signed)
SIT TO STAND: No Device   Sit with feet shoulder-width apart, on floor.(Make sure that you are in a chair that won't move like a chair against a wall or couch etc) Lean chest forward, raise hips up from surface. Straighten hips and knees. Weight bear equally on left and right sides. 10___ reps per set, _2__ sets per day, _5__ days per week Place left leg closer to sitting surface.  Copyright  VHI. All rights reserved.  Backward Walking   Walk backward, toes of each foot coming down first. Take long, even strides. Make sure you have a clear pathway with no obstructions when you do this. Stand beside counter and walk backward  And then walk forward doing opposite directions; repeat 10 laps 2x a day at least 5 days a week.  Copyright  VHI. All rights reserved.  Tandem Walking   Stand beside kitchen sink and place one foot in front of the other, lift your hand and try to hold position for 10 sec. Repeat with other foot in front; Repeat 5 reps with each foot in front 5 days a week.Balance: Unilateral   Attempt to balance on left leg, eyes open. Hold _5-10___ seconds.Start with holding onto counter and if you get your balance you can try to let go of counter. Repeat __5__ times per set. Do __1__ sets per session. Do __1__ sessions per day. Keep eyes open:   http://orth.exer.us/29   Copyright  VHI. All rights reserved.     

## 2016-01-20 NOTE — Telephone Encounter (Signed)
Called David Rice  CRT, RCP for [name] he is going to look at patient file and determine exactly what we need for patient CPAP supplies and for Medicare to pay. FYI
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
## 2016-01-22 ENCOUNTER — Encounter: Payer: Self-pay | Admitting: Physical Therapy

## 2016-01-22 ENCOUNTER — Ambulatory Visit (INDEPENDENT_AMBULATORY_CARE_PROVIDER_SITE_OTHER): Payer: Medicare Other

## 2016-01-22 ENCOUNTER — Ambulatory Visit: Payer: Medicare Other | Admitting: Physical Therapy

## 2016-01-22 DIAGNOSIS — R269 Unspecified abnormalities of gait and mobility: Secondary | ICD-10-CM | POA: Diagnosis not present

## 2016-01-22 DIAGNOSIS — Z9181 History of falling: Secondary | ICD-10-CM

## 2016-01-22 DIAGNOSIS — R531 Weakness: Secondary | ICD-10-CM

## 2016-01-22 DIAGNOSIS — I4891 Unspecified atrial fibrillation: Secondary | ICD-10-CM

## 2016-01-22 LAB — POCT INR: INR: 1.9

## 2016-01-22 NOTE — Therapy (Signed)
[place] MAIN [facility] SERVICES [address] [place], [place], [address] Phone: [phone]   Fax:  [phone]  Physical Therapy Treatment  Patient Details  Name: David Rice MRN: [id number] Date of Birth: [birth year] Referring Provider: Dr. Narda Amber;   Encounter Date: 01/22/2016      PT End of Session - 01/22/16 1345    Visit Number 2   Number of Visits 13   Date for PT Re-Evaluation 03/02/16   Authorization Type Gcode 2   Authorization Time Period 10   PT Start Time 1300   PT Stop Time 1345   PT Time Calculation (min) 45 min   Activity Tolerance Patient tolerated treatment well   Behavior During Therapy [facility] for tasks assessed/performed      Past Medical History  Diagnosis Date  . A-fib ([place])   . Bowel obstruction ([place])   . Hearing impaired   . Diverticulitis   . Hypertension   . Thoracic aortic aneurysm without rupture ([place])   . Sleep apnea   . AAA (abdominal aortic aneurysm) ([place])   . Essential hypertension   . Hyperlipidemia   . Persistent atrial fibrillation [facility])     Status post ablation in July 2016 in [place].    Past Surgical History  Procedure Laterality Date  . Cardiac electrophysiology mapping and ablation    . Abdominal surgery    . Hernia repair    . Peripheral vascular catheterization N/A 08/02/2015    Procedure: Abdominal Aortogram w/Lower Extremity;  Surgeon: Katha Cabal, MD;  Location: [place] CV LAB;  Service: Cardiovascular;  Laterality: N/A;  . Peripheral vascular catheterization  08/02/2015    Procedure: Lower Extremity Intervention;  Surgeon: Katha Cabal, MD;  Location: [place] CV LAB;  Service: Cardiovascular;;  . Cardiac catheterization  11/2014    [place]    There were no vitals filed for this visit.  Visit Diagnosis:  Abnormality of gait  Risk for falls  Weakness      Subjective Assessment - 01/22/16 1310    Subjective Patient reports  doing pretty well today; He reports having less shortness of breath today; PT discussed with patient about pursuing balance rehab and then aftewards pursue pulmonary rehab;    Pertinent History personal factors affecting rehab: numbness in feet, A-fib (somewhat controlled), severe hearing impaired, lives alone;    How long can you sit comfortably? NA   How long can you stand comfortably? unknown;    How long can you walk comfortably? short of breath after walking 20 feet;    Patient Stated Goals Improve balance/steadiness; improve strength; get off O2;    Currently in Pain? No/denies      TREATMENT:  Warm up on recumbent bike x4 min (Unbilled);  Resisted weighted gait: 17.5# forward/backward, side stepping 3way, x3 laps each with CGA and min VCs to slow down eccentric return;'  Standing heel raises x15; Seated green tband ankle DF x15 bilaterally; Seated green tband ankle EV x15 bilaterally;  Patient required mod VCs to slow down LE movement for better ankle strengthening;  Green tband around ankles: Side stepping x15 feet x2 laps each direction; Forward/backward diagonal steps x15 feet x2 laps each;  Forward stand on 1/2 bolster: BUE wand flexion x10; Patient required CGA for safety and min VCs for better ankle strategies with advanced exercise; BUE ball pass side/side x5 each direction;  Tandem stance on 1/2 bolster without rail assist 10 sec hold x3 each  foot in front;  Patient required min VCs for balance stability, including to increase trunk control for less loss of balance with smaller base of support                          PT Education - 01/22/16 1345    Education provided Yes   Education Details HEP advanced, balance   Person(s) Educated Patient   Methods Explanation;Verbal cues   Comprehension Verbalized understanding;Returned demonstration;Verbal cues required             PT Long Term Goals - Feb 20, 2016 0925    PT LONG TERM GOAL #1    Title Patient will be independent in home exercise program to improve strength/mobility for better functional independence with ADLs. by 03/02/16   Time 6   Period Weeks   Status New   PT LONG TERM GOAL #2   Title Patient will tolerate 5 seconds of single leg stance without loss of balance to improve ability to get in and out of shower safely. by 03/02/16   Time 6   Period Weeks   Status New   PT LONG TERM GOAL #3   Title  Patient will demonstrate an improved Berg Balance Score of > 52/56 as to demonstrate improved balance with ADLs such as sitting/standing and transfer balance and reduced fall risk. by 03/02/16   Time 6   Period Weeks   Status New   PT LONG TERM GOAL #4   Title Patient will be independent in ambulating on uneven surfaces without AD, without sway or loss of balance to demonstrate improved ability for walking in yard by 03/02/16   Time 6   Period Weeks   Status New   PT LONG TERM GOAL #5   Title Patient will increase BLE gross strength to 4+/5 as to improve functional strength for independent gait, increased standing tolerance and increased ADL ability. by 03/02/16   Time 6   Period Weeks   Status New               Plan - 01/22/16 1346    Clinical Impression Statement Instructed patient in advanced balance exercise on even/uneven surfaces; Patient required min VCs and tactile cues for better weight shift to control balance. Patient also instructed in ankle strengthening for HEP to improve joint control. Patient would benefit from additional skilled PT intervention to improve LE strength, balance and gait safety; SPO2 remained >96% throughout session; BP 134/98 at end of PT session;    Pt will benefit from skilled therapeutic intervention in order to improve on the following deficits Decreased endurance;Cardiopulmonary status limiting activity;Decreased activity tolerance;Decreased strength;Difficulty walking;Decreased mobility;Decreased balance;Decreased safety  awareness   Rehab Potential Fair   Clinical Impairments Affecting Rehab Potential positive: motivated, good family support; negative: co-morbidities including hard of hearing; Patient's clinical presentation is evolving as he has uncontrolled A-fib and shortness of breath on exertion with SPO2 readings going up/down;    PT Frequency 2x / week   PT Duration 6 weeks   PT Treatment/Interventions Cryotherapy;Electrical Stimulation;Moist Heat;Balance training;Therapeutic exercise;Therapeutic activities;Functional mobility training;Stair training;Gait training;DME Instruction;Neuromuscular re-education;Patient/family education;Taping;Energy conservation   PT Next Visit Plan work on dynamic balance (high level)   PT Home Exercise Plan advanced, see patient instructions;    Consulted and Agree with Plan of Care Patient          G-Codes - Feb 20, 2016 0928    Functional Assessment Tool Used 6 min walk test, 10 meter  walk, balance, clinical judgement;    Functional Limitation Mobility: Walking and moving around   Mobility: Walking and Moving Around Current Status [phone]) At least 20 percent but less than 40 percent impaired, limited or restricted   Mobility: Walking and Moving Around Goal Status [phone]) At least 1 percent but less than 20 percent impaired, limited or restricted      Problem List Patient Active Problem List   Diagnosis Date Noted  . Chronic respiratory failure with hypoxia ([place]) 01/16/2016  . Other allergic rhinitis 01/16/2016  . Hereditary and idiopathic peripheral neuropathy 12/20/2015  . Atrial fibrillation, unspecified type ([place]) 09/25/2015  . Sensorineural hearing loss of both ears 09/12/2015  . OSA on CPAP 09/12/2015  . Colon polyp 09/12/2015  . Diverticulitis 09/12/2015  . Hemorrhoids, internal 09/12/2015  . Obesity 09/12/2015  . History of retinal vein occlusion 09/12/2015  . Screening for skin cancer 09/12/2015  . Benign prostatic hypertrophy without urinary  obstruction 08/05/2015  . Pseudoaneurysm of left femoral artery ([place]) 08/01/2015  . Ascending aortic aneurysm ([place]) 08/01/2015  . Essential hypertension   . Hyperlipidemia   . Persistent atrial fibrillation ([place])   . ED (erectile dysfunction) of organic origin 09/26/2014    Sebastin Perlmutter PT, DPT 01/22/2016, 1:47 PM  [place] MAIN [facility] SERVICES [address] [place], [place], [address] Phone: [phone]   Fax:  [phone]  Name: David Rice MRN: [id number] Date of Birth: Apr 11, 1946

## 2016-01-22 NOTE — Patient Instructions (Addendum)
   Copyright  VHI. All rights reserved.  FLEXION: Sitting - Resistance Band (Active)   Sit with right foot flat. Have band tied around both feet, bend ankle, bringing toes toward head. Complete __2_ sets of __10_ repetitions. Perform _2__ sessions per day.   Eversion: Resisted    With right foot in tubing loop, hold tubing around other foot to resist and turn foot out. Repeat __10__ times per set. Do _2___ sets per session. Do __2__ sessions per day.  http://orth.exer.us/14     Copyright  VHI. All rights reserved.  Band Walk: Side Stepping   Tie band around legs, around ankles. Step _10__ feet to one side, then step back to start. Repeat _2-3__ feet per session. Note: Small towel between band and skin eases rubbing.  http://plyo.exer.us/76   Copyright  VHI. All rights reserved.   Band Walk: Zig Zag   Tie green band around legs, around ankles. Walk forward _both__ feet in a zig zag pattern. (approximately 10-15 feet) Without turning walk backward to start for one zig zag. Repeat _2-3__ zig zags per session.   http://plyo.exer.us/80   Copyright  VHI. All rights reserved.

## 2016-01-24 NOTE — Telephone Encounter (Signed)
DME  Order has been printed for CPAP

## 2016-01-24 NOTE — Telephone Encounter (Signed)
Have contacted [employer] to see what documentation they require, David Rice has not return call in 2 days can I get DME for Cpap supplies so I can contact a different supplier.

## 2016-01-24 NOTE — Telephone Encounter (Signed)
Faxed signed 02 order from [place] with notes also sent order for CPAP to [email or web address] with notes and copy of sleep study and compliance.

## 2016-01-27 ENCOUNTER — Ambulatory Visit: Payer: Medicare Other

## 2016-01-27 VITALS — BP 157/80 | HR 76

## 2016-01-27 DIAGNOSIS — Z9181 History of falling: Secondary | ICD-10-CM | POA: Diagnosis not present

## 2016-01-27 DIAGNOSIS — R269 Unspecified abnormalities of gait and mobility: Secondary | ICD-10-CM | POA: Diagnosis not present

## 2016-01-27 DIAGNOSIS — Z0000 Encounter for general adult medical examination without abnormal findings: Secondary | ICD-10-CM | POA: Diagnosis not present

## 2016-01-27 DIAGNOSIS — N400 Enlarged prostate without lower urinary tract symptoms: Secondary | ICD-10-CM | POA: Diagnosis not present

## 2016-01-27 DIAGNOSIS — R531 Weakness: Secondary | ICD-10-CM

## 2016-01-27 NOTE — Therapy (Signed)
[place] MAIN [facility] SERVICES [address] [place], [place], [address] Phone: [phone]   Fax:  [phone]  Physical Therapy Treatment  Patient Details  Name: David Rice MRN: [id number] Date of Birth: 1946-11-09 Referring Provider: Dr. Narda Amber;   Encounter Date: 01/27/2016      PT End of Session - 01/27/16 1556    Visit Number 3   Number of Visits 13   Date for PT Re-Evaluation 03/02/16   Authorization Type Gcode 2   Authorization Time Period 10   PT Start Time 1300   PT Stop Time 1345   PT Time Calculation (min) 45 min   Equipment Utilized During Treatment Gait belt   Activity Tolerance Patient tolerated treatment well   Behavior During Therapy [facility] for tasks assessed/performed      Past Medical History  Diagnosis Date  . A-fib ([place])   . Bowel obstruction ([place])   . Hearing impaired   . Diverticulitis   . Hypertension   . Thoracic aortic aneurysm without rupture ([place])   . Sleep apnea   . AAA (abdominal aortic aneurysm) ([place])   . Essential hypertension   . Hyperlipidemia   . Persistent atrial fibrillation [facility])     Status post ablation in July 2016 in [place].    Past Surgical History  Procedure Laterality Date  . Cardiac electrophysiology mapping and ablation    . Abdominal surgery    . Hernia repair    . Peripheral vascular catheterization N/A 08/02/2015    Procedure: Abdominal Aortogram w/Lower Extremity;  Surgeon: Katha Cabal, MD;  Location: [place] CV LAB;  Service: Cardiovascular;  Laterality: N/A;  . Peripheral vascular catheterization  08/02/2015    Procedure: Lower Extremity Intervention;  Surgeon: Katha Cabal, MD;  Location: [place] CV [facility];  Service: Cardiovascular;;  . Cardiac catheterization  11/2014    [place]:   01/27/16 1306  BP: 157/80  Pulse: 76  SpO2: 97%    Visit Diagnosis:  Abnormality of gait  Risk for falls  Weakness       Subjective Assessment - 01/27/16 1304    Subjective Pt reports he is doing well today. Pt reports continued improvement in shortness of breath. No pain reported at this time. He is performing HEP and has no specific questions or concerns at this time.    Pertinent History personal factors affecting rehab: numbness in feet, A-fib (somewhat controlled), severe hearing impaired, lives alone;    How long can you sit comfortably? NA   How long can you stand comfortably? unknown;    How long can you walk comfortably? short of breath after walking 20 feet;    Patient Stated Goals Improve balance/steadiness; improve strength; get off O2;        TREATMENT:  Ther-ex Resisted weighted gait: 22.5# forward/backward x 5 laps, side stepping (both directions) x 3 laps; Leg press 135# x 10, 165# x 10; 195# x 10; Resisted lunges 22.5# forward and lateral x 10 each direction;  Neuromuscular Re-education Airex eyes open feet together x 30 seconds; Airex eyes closed feet together x 30 seconds; Airex single leg balance alternating LE x 30 seconds each; Forward/backward tandem gait in 2x4, 6 laps each; Tandem stance on 1/2 bolster alternating LE forward 30 seconds x 2 each;   Patient required min VCs for balance stability and to slow down exercises.  PT Education - 01/27/16 1556    Education provided Yes   Education Details Reinforced HEP   Person(s) Educated Patient   Methods Explanation   Comprehension Verbalized understanding             PT Long Term Goals - 01/21/16 0925    PT LONG TERM GOAL #1   Title Patient will be independent in home exercise program to improve strength/mobility for better functional independence with ADLs. by 03/02/16   Time 6   Period Weeks   Status New   PT LONG TERM GOAL #2   Title Patient will tolerate 5 seconds of single leg stance without loss of balance to improve ability to get in and out of shower  safely. by 03/02/16   Time 6   Period Weeks   Status New   PT LONG TERM GOAL #3   Title  Patient will demonstrate an improved Berg Balance Score of > 52/56 as to demonstrate improved balance with ADLs such as sitting/standing and transfer balance and reduced fall risk. by 03/02/16   Time 6   Period Weeks   Status New   PT LONG TERM GOAL #4   Title Patient will be independent in ambulating on uneven surfaces without AD, without sway or loss of balance to demonstrate improved ability for walking in yard by 03/02/16   Time 6   Period Weeks   Status New   PT LONG TERM GOAL #5   Title Patient will increase BLE gross strength to 4+/5 as to improve functional strength for independent gait, increased standing tolerance and increased ADL ability. by 03/02/16   Time 6   Period Weeks   Status New               Plan - 01/27/16 1557    Clinical Impression Statement Pt demonstrates excellent LE strength however he does reports DOE. SaO2 remains 98% or greater throughout exercies and SOB resolves shortly after completion of exercises. Pt demonstrates decreased ankle stability on compliant surfaces with balance activities. He struggles with single leg stance on Airex pad. Pt encouraged to continue HEP and follow-up as scheduled.    Pt will benefit from skilled therapeutic intervention in order to improve on the following deficits Decreased endurance;Cardiopulmonary status limiting activity;Decreased activity tolerance;Decreased strength;Difficulty walking;Decreased mobility;Decreased balance;Decreased safety awareness   Rehab Potential Fair   Clinical Impairments Affecting Rehab Potential positive: motivated, good family support; negative: co-morbidities including hard of hearing; Patient's clinical presentation is evolving as he has uncontrolled A-fib and shortness of breath on exertion with SPO2 readings going up/down;    PT Frequency 2x / week   PT Duration 6 weeks   PT Treatment/Interventions  Cryotherapy;Electrical Stimulation;Moist Heat;Balance training;Therapeutic exercise;Therapeutic activities;Functional mobility training;Stair training;Gait training;DME Instruction;Neuromuscular re-education;Patient/family education;Taping;Energy conservation   PT Next Visit Plan work on dynamic balance (high level), high level strengthening (leg press);   PT Home Exercise Plan Continue as prescribed.    Consulted and Agree with Plan of Care Patient        Problem List Patient Active Problem List   Diagnosis Date Noted  . Chronic respiratory failure with hypoxia ([place]) 01/16/2016  . Other allergic rhinitis 01/16/2016  . Hereditary and idiopathic peripheral neuropathy 12/20/2015  . Atrial fibrillation, unspecified type ([place]) 09/25/2015  . Sensorineural hearing loss of both ears 09/12/2015  . OSA on CPAP 09/12/2015  . Colon polyp 09/12/2015  . Diverticulitis 09/12/2015  . Hemorrhoids, internal 09/12/2015  . Obesity 09/12/2015  . History of retinal  vein occlusion 09/12/2015  . Screening for skin cancer 09/12/2015  . Benign prostatic hypertrophy without urinary obstruction 08/05/2015  . Pseudoaneurysm of left femoral artery ([place]) 08/01/2015  . Ascending aortic aneurysm ([place]) 08/01/2015  . Essential hypertension   . Hyperlipidemia   . Persistent atrial fibrillation ([place])   . ED (erectile dysfunction) of organic origin 09/26/2014   Phillips Grout PT, DPT   Abad Manard 01/27/2016, 3:59 PM  [place] MAIN [facility] SERVICES [address] [place], [place], [address] Phone: [phone]   Fax:  [phone]  Name: NEER HANSON MRN: [id number] Date of Birth: August 30, 1946

## 2016-01-29 ENCOUNTER — Ambulatory Visit (INDEPENDENT_AMBULATORY_CARE_PROVIDER_SITE_OTHER): Payer: Medicare Other

## 2016-01-29 ENCOUNTER — Ambulatory Visit: Payer: Medicare Other | Admitting: Physical Therapy

## 2016-01-29 ENCOUNTER — Encounter: Payer: Self-pay | Admitting: Physical Therapy

## 2016-01-29 DIAGNOSIS — I4891 Unspecified atrial fibrillation: Secondary | ICD-10-CM

## 2016-01-29 DIAGNOSIS — R269 Unspecified abnormalities of gait and mobility: Secondary | ICD-10-CM

## 2016-01-29 DIAGNOSIS — Z9181 History of falling: Secondary | ICD-10-CM

## 2016-01-29 DIAGNOSIS — R531 Weakness: Secondary | ICD-10-CM

## 2016-01-29 LAB — POCT INR: INR: 4.1

## 2016-01-29 NOTE — Patient Instructions (Signed)

## 2016-01-29 NOTE — Therapy (Signed)
[place] MAIN [facility] SERVICES [address] [place], [place], [address] Phone: [phone]   Fax:  [phone]  Physical Therapy Treatment  Patient Details  Name: David Rice MRN: [id number] Date of Birth: 27-Oct-1946 Referring Provider: Dr. Narda Amber;   Encounter Date: 01/29/2016      PT End of Session - 01/29/16 1353    Visit Number 4   Number of Visits 13   Date for PT Re-Evaluation 03/02/16   Authorization Type Gcode 4   Authorization Time Period 10   PT Start Time 1300   PT Stop Time 1345   PT Time Calculation (min) 45 min   Equipment Utilized During Treatment Gait belt   Activity Tolerance Patient tolerated treatment well   Behavior During Therapy [facility] for tasks assessed/performed      Past Medical History  Diagnosis Date  . A-fib ([place])   . Bowel obstruction ([place])   . Hearing impaired   . Diverticulitis   . Hypertension   . Thoracic aortic aneurysm without rupture ([place])   . Sleep apnea   . AAA (abdominal aortic aneurysm) ([place])   . Essential hypertension   . Hyperlipidemia   . Persistent atrial fibrillation [facility])     Status post ablation in July 2016 in [place].    Past Surgical History  Procedure Laterality Date  . Cardiac electrophysiology mapping and ablation    . Abdominal surgery    . Hernia repair    . Peripheral vascular catheterization N/A 08/02/2015    Procedure: Abdominal Aortogram w/Lower Extremity;  Surgeon: Katha Cabal, MD;  Location: [place] [facility];  Service: Cardiovascular;  Laterality: N/A;  . Peripheral vascular catheterization  08/02/2015    Procedure: Lower Extremity Intervention;  Surgeon: Katha Cabal, MD;  Location: [place] [facility];  Service: Cardiovascular;;  . Cardiac catheterization  11/2014    [place]    There were no vitals filed for this visit.  Visit Diagnosis:  Abnormality of gait  Risk for falls  Weakness      Subjective Assessment -  01/29/16 1352    Subjective Patient reports doing okay; He reports increased a-fib after lunch. Patient reports keeping track of his a-fib and notices it more after a meal. He reports increased shortness of breath with challenging activity but continues to have good SPO2 levels at home.    Pertinent History personal factors affecting rehab: numbness in feet, A-fib (somewhat controlled), severe hearing impaired, lives alone;    How long can you sit comfortably? NA   How long can you stand comfortably? unknown;    How long can you walk comfortably? short of breath after walking 20 feet;    Patient Stated Goals Improve balance/steadiness; improve strength; get off O2;    Currently in Pain? No/denies     TREATMENT:  Gait on treadmill forward, 2.4 mph with 2 HHA x2 min, side step each direction and backwards walking 1.0 mph with 2 rail assist x1 min each direction; Patient required min VCs to increase step length and improve posture with advanced gait tasks;  Forward step up on BOSU with opposite LE march (SLS) 3 sec hold x10 each LE with 1-0 rail assist; Standing on BOSU: BUE ball toss x15 with cues for weight shift for better balance control; Mini squat on BOSU x10 reps with BUE ball chest press with min A for balance control;  Patient required min VCs for balance stability, including to increase trunk  control for less loss of balance with smaller base of support  Leg press: BLE heel raises 180#, 195#, x20 each with min VCs to maintain knee extension and focus on increasing ankle PF ROM for better strengthening;  Forward lunges with BUE ball trunk rotation each direction x5 each foot forward;Patient required min A for balance and mod VCs for sequencing; Backwards lunge with BUE ball toss x5 each LE;  Standing on airex: Deep squat with partial sit<>stand x10 reps unsupported with CGA for balance;   Long sitting: Ankle DF grey tband x20 each LE; Ankle EV grey tband 2x15 with BLE with mod  VCS to avoid hip ER/abduction for better ankle strengthening;  Patient required min-moderate verbal/tactile cues for correct exercise technique including cues for positioning and erect posture to improve strengthening;                           PT Education - 01/29/16 1353    Education provided Yes   Education Details HEP, walking program;    Person(s) Educated Patient   Methods Explanation;Verbal cues   Comprehension Verbalized understanding;Returned demonstration;Verbal cues required             PT Long Term Goals - 01/21/16 0925    PT LONG TERM GOAL #1   Title Patient will be independent in home exercise program to improve strength/mobility for better functional independence with ADLs. by 03/02/16   Time 6   Period Weeks   Status New   PT LONG TERM GOAL #2   Title Patient will tolerate 5 seconds of single leg stance without loss of balance to improve ability to get in and out of shower safely. by 03/02/16   Time 6   Period Weeks   Status New   PT LONG TERM GOAL #3   Title  Patient will demonstrate an improved Berg Balance Score of > 52/56 as to demonstrate improved balance with ADLs such as sitting/standing and transfer balance and reduced fall risk. by 03/02/16   Time 6   Period Weeks   Status New   PT LONG TERM GOAL #4   Title Patient will be independent in ambulating on uneven surfaces without AD, without sway or loss of balance to demonstrate improved ability for walking in yard by 03/02/16   Time 6   Period Weeks   Status New   PT LONG TERM GOAL #5   Title Patient will increase BLE gross strength to 4+/5 as to improve functional strength for independent gait, increased standing tolerance and increased ADL ability. by 03/02/16   Time 6   Period Weeks   Status New               Plan - 01/29/16 1353    Clinical Impression Statement Instructed patient in advanced strengthening in ankles and dynamic balance tasks. Patient reports increased  shortness of breath with high exertion tasks such as lunges with UE movement. However SPo2 continues to be >97% with all tasks. Patient would benefit from additional skilled PT interventon to improve balance/gait safety and reduce fall risk;    Pt will benefit from skilled therapeutic intervention in order to improve on the following deficits Decreased endurance;Cardiopulmonary status limiting activity;Decreased activity tolerance;Decreased strength;Difficulty walking;Decreased mobility;Decreased balance;Decreased safety awareness   Rehab Potential Fair   Clinical Impairments Affecting Rehab Potential positive: motivated, good family support; negative: co-morbidities including hard of hearing; Patient's clinical presentation is evolving as he has uncontrolled A-fib and  shortness of breath on exertion with SPO2 readings going up/down;    PT Frequency 2x / week   PT Duration 6 weeks   PT Treatment/Interventions Cryotherapy;Electrical Stimulation;Moist Heat;Balance training;Therapeutic exercise;Therapeutic activities;Functional mobility training;Stair training;Gait training;DME Instruction;Neuromuscular re-education;Patient/family education;Taping;Energy conservation   PT Next Visit Plan work on dynamic balance (high level), high level strengthening (leg press);   PT Home Exercise Plan Continue as prescribed.    Consulted and Agree with Plan of Care Patient        Problem List Patient Active Problem List   Diagnosis Date Noted  . Chronic respiratory failure with hypoxia ([place]) 01/16/2016  . Other allergic rhinitis 01/16/2016  . Hereditary and idiopathic peripheral neuropathy 12/20/2015  . Atrial fibrillation, unspecified type ([place]) 09/25/2015  . Sensorineural hearing loss of both ears 09/12/2015  . OSA on CPAP 09/12/2015  . Colon polyp 09/12/2015  . Diverticulitis 09/12/2015  . Hemorrhoids, internal 09/12/2015  . Obesity 09/12/2015  . History of retinal vein occlusion 09/12/2015  . Screening  for skin cancer 09/12/2015  . Benign prostatic hypertrophy without urinary obstruction 08/05/2015  . Pseudoaneurysm of left femoral artery ([place]) 08/01/2015  . Ascending aortic aneurysm ([place]) 08/01/2015  . Essential hypertension   . Hyperlipidemia   . Persistent atrial fibrillation ([place])   . ED (erectile dysfunction) of organic origin 09/26/2014    Penny Frisbie PT, DPT 01/29/2016, 1:55 PM  [place] MAIN [facility] SERVICES [address] [place], [place], [address] Phone: [phone]   Fax:  [phone]  Name: David Rice MRN: [id number] Date of Birth: Dec 13, 1945

## 2016-01-30 ENCOUNTER — Encounter: Payer: Self-pay | Admitting: Pharmacist

## 2016-01-30 ENCOUNTER — Encounter: Payer: Self-pay | Admitting: Internal Medicine

## 2016-01-30 ENCOUNTER — Ambulatory Visit: Payer: Medicare Other

## 2016-01-30 NOTE — Telephone Encounter (Signed)
I have checked my pulse OX with one at [facility] they read the same.

## 2016-01-30 NOTE — Telephone Encounter (Signed)
Pts TEE scheduled for Monday 02/03/16 at 0800 with Dr Stanford Breed. Pt must arrive at [facility] (Short Stay) at [place].  Confirmation number #-- [id number] and spoke with [facility] in Endo.  Elberta Leatherwood to provide instruction letter and call the pt with TEE appt date, time, and further instructions.  Will route this message to Elberta Leatherwood to make her aware this has been scheduled, and pt ready to be contacted.  Pre-cert will need to be done. Sent a message to pre-cert pool to have this done and send a message to Elberta Leatherwood once complete.

## 2016-01-30 NOTE — Telephone Encounter (Signed)
-----   Message from Aris Georgia, [facility] sent at 01/30/2016  2:53 PM EDT ----- Can someone please help me schedule a TEE for Monday 3/20 and let me know if when it is done.   Thanks!  Gay Filler   ----- Message -----    From: Brynda Peon, RN    Sent: 01/29/2016   4:00 PM      To: Aris Georgia, RPH, [name], MD  Pt seen in [facility] today, INR 4.1.  Pt states he would like to have TEE done prior to Tikosyn start and proceed as planned keeping appt for Tikosyn start on 02/03/16.     ----- Message -----    From: Aris Georgia, [facility]    Sent: 01/23/2016  10:00 AM      To: Brynda Peon, RN, [name], MD  Thoughts on postponing 3 more weeks or TEE prior to starting Tikosyn on 3/20?   ----- Message -----    From: Brynda Peon, RN    Sent: 01/22/2016   3:56 PM      To: Aris Georgia, RPH  Dannielle Huh- This pt is pending Tikosyn start with you on 02/03/16, but unfortunately pt's INR was 1.9 today in office.  Pt wants to know if this Tikosyn start day has to be pushed out or if he can still proceed as planned.  Ill talk to you tomorrow in office about.  Pt is scheduled for f/u INR next Wednesday here 01/29/16 and I told him I would let him know at appt. Pt is HOH prefers communication by my chart or face to face.  You do not have to call him, sending this as a reminder to discuss with you tomorrow.  Thanks-EB

## 2016-01-31 ENCOUNTER — Other Ambulatory Visit: Payer: Self-pay | Admitting: Internal Medicine

## 2016-01-31 DIAGNOSIS — I4819 Other persistent atrial fibrillation: Secondary | ICD-10-CM

## 2016-02-03 ENCOUNTER — Ambulatory Visit: Payer: Medicare Other | Admitting: Pharmacist

## 2016-02-03 ENCOUNTER — Ambulatory Visit: Payer: Medicare Other | Admitting: Physical Therapy

## 2016-02-05 ENCOUNTER — Ambulatory Visit: Payer: Medicare Other | Admitting: Physical Therapy

## 2016-02-07 ENCOUNTER — Telehealth: Payer: Self-pay | Admitting: Internal Medicine

## 2016-02-07 NOTE — Telephone Encounter (Signed)
Spoke with pt.  TEE rescheduled to 02/17/16 at 11:00AM.  Instructions sent through [place] per pt's request.

## 2016-02-07 NOTE — Telephone Encounter (Signed)
Forwarding to Pharmacy

## 2016-02-07 NOTE — Telephone Encounter (Signed)
David Rice is calling about his [name] treatment , it was cancel and he is wanting to know will it be reschedule.Please call    Thanks

## 2016-02-10 ENCOUNTER — Ambulatory Visit: Payer: Medicare Other | Admitting: Physical Therapy

## 2016-02-10 ENCOUNTER — Encounter: Payer: Self-pay | Admitting: Physical Therapy

## 2016-02-10 DIAGNOSIS — R531 Weakness: Secondary | ICD-10-CM | POA: Diagnosis not present

## 2016-02-10 DIAGNOSIS — R269 Unspecified abnormalities of gait and mobility: Secondary | ICD-10-CM | POA: Diagnosis not present

## 2016-02-10 DIAGNOSIS — Z9181 History of falling: Secondary | ICD-10-CM | POA: Diagnosis not present

## 2016-02-10 NOTE — Therapy (Signed)
[place] MAIN [facility] SERVICES [address] [place], [place], [address] Phone: [phone]   Fax:  [phone]  Physical Therapy Treatment  Patient Details  Name: David Rice MRN: [id number] Date of Birth: Aug 28, 1946 Referring Provider: Dr. Narda Amber;   Encounter Date: 02/10/2016      PT End of Session - 02/10/16 1537    Visit Number 5   Number of Visits 13   Date for PT Re-Evaluation 03/02/16   Authorization Type Gcode 5   Authorization Time Period 10   PT Start Time 1302   PT Stop Time 1345   PT Time Calculation (min) 43 min   Equipment Utilized During Treatment Gait belt   Activity Tolerance Patient tolerated treatment well   Behavior During Therapy [facility] for tasks assessed/performed      Past Medical History  Diagnosis Date  . A-fib ([place])   . Bowel obstruction ([place])   . Hearing impaired   . Diverticulitis   . Hypertension   . Thoracic aortic aneurysm without rupture ([place])   . Sleep apnea   . AAA (abdominal aortic aneurysm) ([place])   . Essential hypertension   . Hyperlipidemia   . Persistent atrial fibrillation [facility])     Status post ablation in July 2016 in [place].    Past Surgical History  Procedure Laterality Date  . Cardiac electrophysiology mapping and ablation    . Abdominal surgery    . Hernia repair    . Peripheral vascular catheterization N/A 08/02/2015    Procedure: Abdominal Aortogram w/Lower Extremity;  Surgeon: Katha Cabal, MD;  Location: [place] [facility];  Service: Cardiovascular;  Laterality: N/A;  . Peripheral vascular catheterization  08/02/2015    Procedure: Lower Extremity Intervention;  Surgeon: Katha Cabal, MD;  Location: [place] [facility];  Service: Cardiovascular;;  . Cardiac catheterization  11/2014    [place]    There were no vitals filed for this visit.  Visit Diagnosis:  Abnormality of gait  Risk for falls  Weakness      Subjective Assessment -  02/10/16 1528    Subjective Patient reports that his TEE was cancelled. He is supposed to have it next week. Patient reports not feeling well last week due to food poisoning;    Pertinent History personal factors affecting rehab: numbness in feet, A-fib (somewhat controlled), severe hearing impaired, lives alone;    How long can you sit comfortably? NA   How long can you stand comfortably? unknown;    How long can you walk comfortably? short of breath after walking 20 feet;    Patient Stated Goals Improve balance/steadiness; improve strength; get off O2;         TREATMENT:  Warm up on Treadmill 2.5 mph x4 min (unbilled);  Forward/backward resisted weighted gait stepping over 6 inch step x10 reps with 12.5# resistance; Side stepping up and over 6 inch step with 12.5# resistance x10 reps each direction; Patient requires min A for balance and min-mod VCs for better weight shift to improve balance control;  Forward/backward diagonal with red tband x30 feet each direction with min VCs to increase step length for better hip strengthening/challenge; Side lunge with red tband around thighs x10 each direction with min Vcs for positioning including to only bend one knee, and to improve foot position for better knee safety;  Advanced HEP- see attached; Patient instructed in ways to improve compliance with HEP  Forward lunge on narrow beam with  yellow weighted ball trunk rotation x1 each direction x8 steps each foot in front with min A for balance and mod VCs to improve weight shift, increase knee bend for better quad strengthening and to slow down LE movement for better control/balance.                           PT Education - 02/10/16 1534    Education provided Yes   Education Details HEP advanced, strengthening/balance exercise   Person(s) Educated Patient   Methods Explanation;Verbal cues   Comprehension Verbalized understanding;Returned demonstration;Verbal cues  required             PT Long Term Goals - 01/21/16 0925    PT LONG TERM GOAL #1   Title Patient will be independent in home exercise program to improve strength/mobility for better functional independence with ADLs. by 03/02/16   Time 6   Period Weeks   Status New   PT LONG TERM GOAL #2   Title Patient will tolerate 5 seconds of single leg stance without loss of balance to improve ability to get in and out of shower safely. by 03/02/16   Time 6   Period Weeks   Status New   PT LONG TERM GOAL #3   Title  Patient will demonstrate an improved Berg Balance Score of > 52/56 as to demonstrate improved balance with ADLs such as sitting/standing and transfer balance and reduced fall risk. by 03/02/16   Time 6   Period Weeks   Status New   PT LONG TERM GOAL #4   Title Patient will be independent in ambulating on uneven surfaces without AD, without sway or loss of balance to demonstrate improved ability for walking in yard by 03/02/16   Time 6   Period Weeks   Status New   PT LONG TERM GOAL #5   Title Patient will increase BLE gross strength to 4+/5 as to improve functional strength for independent gait, increased standing tolerance and increased ADL ability. by 03/02/16   Time 6   Period Weeks   Status New               Plan - 02/10/16 1537    Clinical Impression Statement Patient instructed in advanced strengthening/balance exercise. He did have trouble with standing with narrow base of support, but otherwise did well with exercise. He does require min Vcs to slow down LE movement and improve positioning to increase strengthening. SPO2 levels remained >98% throughout treatment session; He would benefit from additional skilled PT Intervention to improve LE strength, balance and gait safety;    Pt will benefit from skilled therapeutic intervention in order to improve on the following deficits Decreased endurance;Cardiopulmonary status limiting activity;Decreased activity  tolerance;Decreased strength;Difficulty walking;Decreased mobility;Decreased balance;Decreased safety awareness   Rehab Potential Fair   Clinical Impairments Affecting Rehab Potential positive: motivated, good family support; negative: co-morbidities including hard of hearing; Patient's clinical presentation is evolving as he has uncontrolled A-fib and shortness of breath on exertion with SPO2 readings going up/down;    PT Frequency 2x / week   PT Duration 6 weeks   PT Treatment/Interventions Cryotherapy;Electrical Stimulation;Moist Heat;Balance training;Therapeutic exercise;Therapeutic activities;Functional mobility training;Stair training;Gait training;DME Instruction;Neuromuscular re-education;Patient/family education;Taping;Energy conservation   PT Next Visit Plan work on dynamic balance (high level), high level strengthening (leg press);   PT Home Exercise Plan Continue as prescribed.    Consulted and Agree with Plan of Care Patient  Problem List Patient Active Problem List   Diagnosis Date Noted  . Chronic respiratory failure with hypoxia ([place]) 01/16/2016  . Other allergic rhinitis 01/16/2016  . Hereditary and idiopathic peripheral neuropathy 12/20/2015  . Atrial fibrillation, unspecified type ([place]) 09/25/2015  . Sensorineural hearing loss of both ears 09/12/2015  . OSA on CPAP 09/12/2015  . Colon polyp 09/12/2015  . Diverticulitis 09/12/2015  . Hemorrhoids, internal 09/12/2015  . Obesity 09/12/2015  . History of retinal vein occlusion 09/12/2015  . Screening for skin cancer 09/12/2015  . Benign prostatic hypertrophy without urinary obstruction 08/05/2015  . Pseudoaneurysm of left femoral artery ([place]) 08/01/2015  . Ascending aortic aneurysm ([place]) 08/01/2015  . Essential hypertension   . Hyperlipidemia   . Persistent atrial fibrillation ([place])   . ED (erectile dysfunction) of organic origin 09/26/2014    Trotter,Margaret PT, DPT 02/10/2016, 3:39 PM  [place] MAIN [facility] SERVICES [address] [place], [place], [address] Phone: [phone]   Fax:  [phone]  Name: David Rice MRN: [id number] Date of Birth: 1946-01-24

## 2016-02-10 NOTE — Patient Instructions (Signed)
Dumbbell Side Lunge: Stable - Stationary (Active)    Head up, back flat, step to side, bending forward leg until thigh is parallel to floor.  Complete 2___ sets of _10__ repetitions. Perform __2_ sessions per day.  http://gtsc.exer.us/496    Copyright  VHI. All rights reserved.   Band Walk: Zig Zag   Tie green band around legs, just above knees. Walk forward _both__ feet in a zig zag pattern. Without turning walk backward to start for one zig zag. Repeat _2-3__ zig zags per session.   http://plyo.exer.us/80   Copyright  VHI. All rights reserved.

## 2016-02-11 ENCOUNTER — Other Ambulatory Visit: Payer: Medicare Other

## 2016-02-11 DIAGNOSIS — I4891 Unspecified atrial fibrillation: Secondary | ICD-10-CM

## 2016-02-11 LAB — MAGNESIUM: MAGNESIUM: 2.1 mg/dL (ref 1.5–2.5)

## 2016-02-12 ENCOUNTER — Telehealth: Payer: Self-pay | Admitting: Internal Medicine

## 2016-02-12 ENCOUNTER — Encounter: Payer: Self-pay | Admitting: Physical Therapy

## 2016-02-12 ENCOUNTER — Ambulatory Visit: Payer: Medicare Other | Admitting: Physical Therapy

## 2016-02-12 ENCOUNTER — Ambulatory Visit (INDEPENDENT_AMBULATORY_CARE_PROVIDER_SITE_OTHER): Payer: Medicare Other

## 2016-02-12 DIAGNOSIS — I4891 Unspecified atrial fibrillation: Secondary | ICD-10-CM | POA: Diagnosis not present

## 2016-02-12 DIAGNOSIS — R531 Weakness: Secondary | ICD-10-CM | POA: Diagnosis not present

## 2016-02-12 DIAGNOSIS — Z9181 History of falling: Secondary | ICD-10-CM | POA: Diagnosis not present

## 2016-02-12 DIAGNOSIS — R269 Unspecified abnormalities of gait and mobility: Secondary | ICD-10-CM

## 2016-02-12 LAB — BASIC METABOLIC PANEL
BUN: 15 mg/dL (ref 7–25)
CO2: 24 mmol/L (ref 20–31)
Calcium: 9.1 mg/dL (ref 8.6–10.3)
Chloride: 103 mmol/L (ref 98–110)
Creat: 1.23 mg/dL (ref 0.70–1.25)
GLUCOSE: 100 mg/dL — AB (ref 65–99)
POTASSIUM: 3.9 mmol/L (ref 3.5–5.3)
SODIUM: 141 mmol/L (ref 135–146)

## 2016-02-12 LAB — POCT INR: INR: 2.8

## 2016-02-12 NOTE — Therapy (Signed)
[place] MAIN [facility] SERVICES [address] [place], [place], [address] Phone: [phone]   Fax:  [phone]  Physical Therapy Treatment  Patient Details  Name: David Rice MRN: [id number] Date of Birth: 09-24-1946 Referring Provider: Dr. Narda Amber;   Encounter Date: 02/12/2016      PT End of Session - 02/12/16 1352    Visit Number 6   Number of Visits 13   Date for PT Re-Evaluation 03/02/16   Authorization Type Gcode 6   Authorization Time Period 10   PT Start Time 1300   PT Stop Time 1345   PT Time Calculation (min) 45 min   Equipment Utilized During Treatment Gait belt   Activity Tolerance Patient tolerated treatment well   Behavior During Therapy [facility] for tasks assessed/performed      Past Medical History  Diagnosis Date  . A-fib ([place])   . Bowel obstruction ([place])   . Hearing impaired   . Diverticulitis   . Hypertension   . Thoracic aortic aneurysm without rupture ([place])   . Sleep apnea   . AAA (abdominal aortic aneurysm) ([place])   . Essential hypertension   . Hyperlipidemia   . Persistent atrial fibrillation [facility])     Status post ablation in July 2016 in [place].    Past Surgical History  Procedure Laterality Date  . Cardiac electrophysiology mapping and ablation    . Abdominal surgery    . Hernia repair    . Peripheral vascular catheterization N/A 08/02/2015    Procedure: Abdominal Aortogram w/Lower Extremity;  Surgeon: Katha Cabal, MD;  Location: [place] [facility];  Service: Cardiovascular;  Laterality: N/A;  . Peripheral vascular catheterization  08/02/2015    Procedure: Lower Extremity Intervention;  Surgeon: Katha Cabal, MD;  Location: [place] [facility];  Service: Cardiovascular;;  . Cardiac catheterization  11/2014    [place]    There were no vitals filed for this visit.  Visit Diagnosis:  Abnormality of gait  Risk for falls  Weakness      Subjective Assessment -  02/12/16 1309    Subjective Paitent reports doing well; He reports doing a lot of yardwork yesterday and was concerned he would be tired today, but actually he has felt pretty good. He denies any soreness; He reports that his exercises are going well.    Pertinent History personal factors affecting rehab: numbness in feet, A-fib (somewhat controlled), severe hearing impaired, lives alone;    How long can you sit comfortably? NA   How long can you stand comfortably? unknown;    How long can you walk comfortably? short of breath after walking 20 feet;    Patient Stated Goals Improve balance/steadiness; improve strength; get off O2;    Currently in Pain? No/denies         TREATMENT:  Warm up on Treadmill 2.5 mph x4 min (unbilled);  Forward/backward lunges with resisted weighted gait 17. # x5  reps each, with each leg; Side stepping cross overs, with 17.5# resistance x2 laps each direction; Patient requires min A for balance and min-mod VCs for better weight shift to improve balance control;   Hooklying: Lumbar trunk rotation x1 min; Modified piriformis stretch 15 sec hold x2 bilaterally; Cat/camel stretch 5 sec hold x5 reps; Prayer stretch 15 sec hold x2; Supine: hamstring neural stretch 5 sec hold x5; Patient required min VCS for positioning with all stretches to improve tissue extensibility; Provided written handout for HEP  to increase compliance and joint mobility;   Educated patient in correct lifting techniques to reduce stiffness, ex: golfers pick up, modified squat with wide base of support with cues to avoid excessive knee flexion and improve hand placement;  SLS toe taps on star, x2 reps each with cues to increase erect posture, increase step out for better challenge with SLS tasks.                        PT Education - 02/12/16 1352    Education provided Yes   Education Details HEP advanced with lumbar and LE stretches; balance exercise;    Person(s)  Educated Patient   Methods Explanation;Verbal cues   Comprehension Verbalized understanding;Returned demonstration;Verbal cues required             PT Long Term Goals - 01/21/16 0925    PT LONG TERM GOAL #1   Title Patient will be independent in home exercise program to improve strength/mobility for better functional independence with ADLs. by 03/02/16   Time 6   Period Weeks   Status New   PT LONG TERM GOAL #2   Title Patient will tolerate 5 seconds of single leg stance without loss of balance to improve ability to get in and out of shower safely. by 03/02/16   Time 6   Period Weeks   Status New   PT LONG TERM GOAL #3   Title  Patient will demonstrate an improved Berg Balance Score of > 52/56 as to demonstrate improved balance with ADLs such as sitting/standing and transfer balance and reduced fall risk. by 03/02/16   Time 6   Period Weeks   Status New   PT LONG TERM GOAL #4   Title Patient will be independent in ambulating on uneven surfaces without AD, without sway or loss of balance to demonstrate improved ability for walking in yard by 03/02/16   Time 6   Period Weeks   Status New   PT LONG TERM GOAL #5   Title Patient will increase BLE gross strength to 4+/5 as to improve functional strength for independent gait, increased standing tolerance and increased ADL ability. by 03/02/16   Time 6   Period Weeks   Status New               Plan - 02/12/16 1352    Clinical Impression Statement Instructed patient in advanced balance exercise with resisted weighted gait and SLS activities; Patient reports feeling increased stiffness in spine and LE. Instructed patient in lumbar and hip stretches to increase flexibility for better tolerance with standing gait tasks and functional mobility including lifting floor to waist. Would benefit from additional skilled PT Intervention to improve LE strength, balance/gait safety; He will be on hold for 1 week due to upcoming procedure. Will  reassess goals at that time when he returns.    Pt will benefit from skilled therapeutic intervention in order to improve on the following deficits Decreased endurance;Cardiopulmonary status limiting activity;Decreased activity tolerance;Decreased strength;Difficulty walking;Decreased mobility;Decreased balance;Decreased safety awareness   Rehab Potential Fair   Clinical Impairments Affecting Rehab Potential positive: motivated, good family support; negative: co-morbidities including hard of hearing; Patient's clinical presentation is evolving as he has uncontrolled A-fib and shortness of breath on exertion with SPO2 readings going up/down;    PT Frequency 2x / week   PT Duration 6 weeks   PT Treatment/Interventions Cryotherapy;Electrical Stimulation;Moist Heat;Balance training;Therapeutic exercise;Therapeutic activities;Functional mobility training;Stair training;Gait training;DME Instruction;Neuromuscular re-education;Patient/family education;Taping;Energy  conservation   PT Next Visit Plan work on dynamic balance (high level), high level strengthening (leg press);   PT Home Exercise Plan Continue as prescribed.    Consulted and Agree with Plan of Care Patient        Problem List Patient Active Problem List   Diagnosis Date Noted  . Chronic respiratory failure with hypoxia ([place]) 01/16/2016  . Other allergic rhinitis 01/16/2016  . Hereditary and idiopathic peripheral neuropathy 12/20/2015  . Atrial fibrillation, unspecified type ([place]) 09/25/2015  . Sensorineural hearing loss of both ears 09/12/2015  . OSA on CPAP 09/12/2015  . Colon polyp 09/12/2015  . Diverticulitis 09/12/2015  . Hemorrhoids, internal 09/12/2015  . Obesity 09/12/2015  . History of retinal vein occlusion 09/12/2015  . Screening for skin cancer 09/12/2015  . Benign prostatic hypertrophy without urinary obstruction 08/05/2015  . Pseudoaneurysm of left femoral artery ([place]) 08/01/2015  . Ascending aortic aneurysm ([place])  08/01/2015  . Essential hypertension   . Hyperlipidemia   . Persistent atrial fibrillation ([place])   . ED (erectile dysfunction) of organic origin 09/26/2014    Eero Dini PT, DPT 02/12/2016, 1:54 PM  [place] MAIN [facility] SERVICES [address] [place], [place], [address] Phone: [phone]   Fax:  [phone]  Name: David Rice MRN: [id number] Date of Birth: 1946-02-19

## 2016-02-12 NOTE — Telephone Encounter (Signed)
Kardia strips placed in [place] [facility] in basket for MD review

## 2016-02-12 NOTE — Telephone Encounter (Signed)
Patient dropped of E kardia strips for Dr. Caryl Comes to review.  Placed in Nurse box.

## 2016-02-12 NOTE — Telephone Encounter (Signed)
Pt attending rehab for peripheral neuropathy. Original order was placed by [facility].  I have sent a staff message to Su Hoff, LPN, who put in the order on behalf of Dr. Posey Pronto.

## 2016-02-12 NOTE — Patient Instructions (Addendum)
  Provided handout with LE stretches;

## 2016-02-12 NOTE — Telephone Encounter (Signed)
Patient needs clearance/ new order to continue cardiac rehab at [facility] .

## 2016-02-13 ENCOUNTER — Ambulatory Visit
Admission: RE | Admit: 2016-02-13 | Discharge: 2016-02-13 | Disposition: A | Discharge status: Home / Self Care | Payer: Medicare Other | Source: Ambulatory Visit | Attending: Cardiothoracic Surgery | Admitting: Cardiothoracic Surgery

## 2016-02-13 ENCOUNTER — Ambulatory Visit (INDEPENDENT_AMBULATORY_CARE_PROVIDER_SITE_OTHER): Payer: Medicare Other | Admitting: Cardiothoracic Surgery

## 2016-02-13 ENCOUNTER — Encounter: Payer: Self-pay | Admitting: Cardiothoracic Surgery

## 2016-02-13 VITALS — BP 152/85 | HR 63 | Resp 18 | Ht 74.0 in | Wt 242.0 lb

## 2016-02-13 DIAGNOSIS — I712 Thoracic aortic aneurysm, without rupture, unspecified: Secondary | ICD-10-CM

## 2016-02-13 DIAGNOSIS — I7121 Aneurysm of the ascending aorta, without rupture: Secondary | ICD-10-CM

## 2016-02-13 DIAGNOSIS — R0602 Shortness of breath: Secondary | ICD-10-CM | POA: Diagnosis not present

## 2016-02-13 MED ORDER — IOPAMIDOL (ISOVUE-370) INJECTION 76%
75.0000 mL | Freq: Once | INTRAVENOUS | Status: AC | PRN
  Administered 2016-02-13: 75 mL via INTRAVENOUS

## 2016-02-13 NOTE — Progress Notes (Signed)
[address]       [place] [address]             [phone]                    [facility] Medical Record [id number] Date of Birth: February 21, 1946  Referring: [name], MD Primary Care: Crecencio Mc, MD  Chief Complaint:    Chief Complaint  Patient presents with  . Thoracic Aortic Aneurysm    6 month f/u CTA Chest 02/13/16    History of Present Illness:    David Rice 70 y.o. male is seen in the office  today forFollow-up on  dilated ascending aorta first noted at cardiac catheterization done at [facility] in the summer of 2016 during the evaluation for atrial fibrillation and cardiac catheterization. The patient notes the onset of atrial fibrillation in the past year and notes that his "life went into the toilet" with the side effects from drugs used to treat his atrial fibrillation. At the time he was living in [place] and being treated in [place].  At the time of catheterization he was noted to have a dilated ascending aorta, and subsequently had a CTA of the chest abdomen and pelvis. 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 fib ablation procedure previously done. This pseudoaneurysm was occluded endovascularly at [facility] in September 2016.   Echo cardiogram basically showed a normal heart, the CT scan also found an incidental 4.7 cm ascending aortic dilatation.  The patient has no stigmata of Marfan's or other connective tissue disorder, he has no family history of early sudden death or known dissection or descending aneurysm. The patient's father died at age 32 with lung cancer was a long-term smoker, his mother is still alive and doing well at age 108, he has one brother with mental disorder but no known aneurysm he has 2 sons that are alive and healthy and well.   Patient is to be admitted next week for a TEE and medically  cardioverted. CL he will likely only and to record EKG strip. Continues to have significant symptoms related to A. fib and rate control.  Current Activity/ Functional Status:  Patient is independent with mobility/ambulation, transfers, ADL's, IADL's.   Zubrod Score: At the time of surgery this patient's most appropriate activity status/ level should be described as: [x]     0    Normal activity, no symptoms []     1    Restricted in physical strenuous activity but ambulatory, able to do out light work []     2    Ambulatory and capable of self care, unable to do work activities, up and about               >50 % of waking hours                              []     3    Only limited self care, in bed greater than 50% of waking hours []     4    Completely disabled, no self care, confined to bed or chair []     5    Moribund   Past Medical History  Diagnosis Date  . A-fib ([place])   . Bowel obstruction ([place])   .  Hearing impaired   . Diverticulitis   . Hypertension   . Thoracic aortic aneurysm without rupture ([place])   . Sleep apnea   . AAA (abdominal aortic aneurysm) ([place])   . Essential hypertension   . Hyperlipidemia   . Persistent atrial fibrillation [facility])     Status post ablation in July 2016 in [place].    Past Surgical History  Procedure Laterality Date  . Cardiac electrophysiology mapping and ablation    . Abdominal surgery    . Hernia repair    . Peripheral vascular catheterization N/A 08/02/2015    Procedure: Abdominal Aortogram w/Lower Extremity;  Surgeon: Katha Cabal, MD;  Location: [facility];  Service: Cardiovascular;  Laterality: N/A;  . Peripheral vascular catheterization  08/02/2015    Procedure: Lower Extremity Intervention;  Surgeon: Katha Cabal, MD;  Location: [facility];  Service: Cardiovascular;;  . Cardiac catheterization  11/2014    [facility]    Family History  Problem Relation Age of Onset  . Hypertension Mother       Living 11  . Lung cancer Father     Deceased, 49  . Hypertension Brother   . Healthy Son   . Healthy Daughter     Social History  Patient's wife died at age 25, he raised 2 small children on his own for the next 27 years Social History  . Marital Status: Single    Spouse Name: N/A  . Number of Children: N/A  . Years of Education: N/A   Occupational History  .  patient retired as [employer]    Social History Main Topics  . Smoking status: Never Smoker   . Smokeless tobacco: Not on file  . Alcohol Use: 1.2 oz/week    2 Glasses of wine per week  . Drug Use: No  . Sexual Activity: Not on file   Other Topics Concern  . Not on file   Social History Narrative    History  Smoking status  . Never Smoker   Smokeless tobacco  . Not on file    History  Alcohol Use  . 1.2 oz/week  . 2 Glasses of wine per week    Comment: 2 glasses of wine aabout 3 times per week     Allergies  Allergen Reactions  . Lactose Intolerance (Gi)     Current Outpatient Prescriptions  Medication Sig Dispense Refill  . atorvastatin (LIPITOR) 10 MG tablet Take 1 tablet (10 mg total) by mouth daily. 90 tablet 3  . esomeprazole (NEXIUM) 20 MG capsule Take 20 mg by mouth daily at 12 noon.    . Lactase (LACTAID PO) Take by mouth. Take 2 capsules daily.    Marland Kitchen loratadine (CLARITIN) 10 MG tablet Take 10 mg by mouth daily.    . mometasone (NASONEX) 50 MCG/ACT nasal spray Place 2 sprays into the nose 2 (two) times daily. 17 g 12  . silodosin (RAPAFLO) 8 MG CAPS capsule Take 8 mg by mouth at bedtime.    . traMADol (ULTRAM) 50 MG tablet Take 1 tablet (50 mg total) by mouth every 6 (six) hours as needed. 90 tablet 2  . warfarin (COUMADIN) 5 MG tablet Take as directed by [facility] 45 tablet 3   No current facility-administered medications for this visit.      Review of Systems:     Cardiac Review of Systems: Y or N  Chest Pain [  n  ]  Resting SOB [  n  ] Exertional SOB  Blue.Reese  ]  Orthopnea n[  ]    Pedal Edema [ y  ]    Palpitations Blue.Reese  ] Syncope  [ n ]   Presyncope [n   ]  General Review of Systems: [Y] = yes [  ]=no Constitional: recent weight change [  ];  Wt loss over the last 3 months [   ] anorexia [  ]; fatigue [ x ]; nausea [  ]; night sweats [  ]; fever [  ]; or chills [  ];          Dental: poor dentition[  ]; Last Dentist visit:   Eye : blurred vision [  ]; diplopia [   ]; vision changes [  ];  Amaurosis fugax[  ]; Resp: cough [  ];  wheezing[  ];  hemoptysis[  ]; shortness of breath[  ]; paroxysmal nocturnal dyspnea[  ]; dyspnea on exertion[  ]; or orthopnea[  ];  GI:  gallstones[  ], vomiting[  ];  dysphagia[  ]; melena[  ];  hematochezia [  ]; heartburn[  ];   Hx of  Colonoscopy[  ]; GU: kidney stones [  ]; hematuria[  ];   dysuria [  ];  nocturia[  ];  history of     obstruction [  ]; urinary frequency [  ]             Skin: rash, swelling[  ];, hair loss[  ];  peripheral edema[  ];  or itching[  ]; Musculosketetal: myalgias[  ];  joint swelling[  ];  joint erythema[  ];  joint pain[  ];  back pain[  ];  Heme/Lymph: bruising[  ];  bleeding[  ];  anemia[  ];  Neuro: TIA[  ];  headaches[  ];  stroke[  ];  vertigo[  ];  seizures[  ];   paresthesias[  ];  difficulty walking[n  ]; with the medication for atrial fibrillation the patient had significant problems with tremor to the point he was unable to use hand tools or power this has improved lately  Psych:depression[  ]; anxiety[  ];  Endocrine: diabetes[  ];  thyroid dysfunction[  ];  Immunizations: Flu up to date [  ]; Pneumococcal up to date [  ];  Other:  Physical Exam: BP 152/85 mmHg  Pulse 63  Resp 18  Ht 6\' 2"  (1.88 m)  Wt 242 lb (109.77 kg)  BMI 31.06 kg/m2  SpO2 98%  PHYSICAL EXAMINATION: General appearance: alert, cooperative, appears stated age and no distress Head: Normocephalic, without obvious abnormality, atraumatic Neck: no adenopathy, no carotid bruit, no JVD, supple, symmetrical, trachea midline and  thyroid not enlarged, symmetric, no tenderness/mass/nodules Lymph nodes: Cervical, supraclavicular, and axillary nodes normal. Resp: clear to auscultation bilaterally Back: symmetric, no curvature. ROM normal. No CVA tenderness. Cardio: regular rate and rhythm, S1, S2 normal, no murmur, click, rub or gallop GI: soft, non-tender; bowel sounds normal; no masses,  no organomegaly Extremities: extremities normal, atraumatic, no cyanosis or edema and Homans sign is negative, no sign of DVT Neurologic: Grossly normal The tremor in the hands the patient previously had is now almost completely resolved per his report  Diagnostic Studies & Laboratory data:     Recent Radiology Findings: Ct Angio Chest W/cm &/or Wo Cm  02/13/2016  CLINICAL DATA:  Followup thoracic aortic aneurysm. Chest pain and shortness of breath. EXAM: CT ANGIOGRAPHY CHEST WITH CONTRAST TECHNIQUE: Multidetector CT  imaging of the chest was performed using the standard protocol during bolus administration of intravenous contrast. Multiplanar CT image reconstructions and MIPs were obtained to evaluate the vascular anatomy. CONTRAST:  75 cc Isovue 370 COMPARISON:  07/28/2015 FINDINGS: Mediastinum/Nodes: No chest wall mass, supraclavicular or axillary lymphadenopathy. Small scattered lymph nodes. The thyroid gland is grossly normal. Mild stable cardiac enlargement. No pericardial effusion. Stable fusiform aneurysm on the attachment of the ascending aorta with maximal measurement of 48 x 47 mm. No dissection. The pulmonary arteries appear normal. No mediastinal or hilar mass or adenopathy. Stable small scattered lymph nodes. Esophagus is grossly normal. Lungs/Pleura: No acute pulmonary findings. Dependent subpleural atelectasis is noted. No worrisome pulmonary lesions. No bronchiectasis or interstitial lung disease. Upper abdomen: No significant findings. Musculoskeletal: No significant findings. Moderate stable degenerative changes involving the  thoracic spine. Review of the MIP images confirms the above findings. IMPRESSION: 1. Stable fusiform aneurysmal dilatation of the ascending aorta with maximal measurement of 48 x 47 mm. No dissection. Ascending thoracic aortic aneurysm. Recommend semi-annual imaging followup by CTA or MRA and referral to cardiothoracic surgery if not already obtained. This recommendation follows 2010 ACCF/AHA/AATS/ACR/ASA/SCA/SCAI/SIR/STS/SVM Guidelines for the Diagnosis and Management of Patients With Thoracic Aortic Disease. Circulation. 2010; 121: [id number] 2. Stable mild cardiac enlargement. 3. No acute or significant pulmonary findings. Electronically Signed   By: Marijo Sanes M.D.   On: 02/13/2016 10:53       CLINICAL DATA: Acute left-sided chest and back pain. History of aortic aneurysm.  EXAM: CT ANGIOGRAPHY CHEST, ABDOMEN AND PELVIS  TECHNIQUE: Multidetector CT imaging through the chest, abdomen and pelvis was performed using the standard protocol during bolus administration of intravenous contrast. Multiplanar reconstructed images and MIPs were obtained and reviewed to evaluate the vascular anatomy.  CONTRAST: 133mL OMNIPAQUE IOHEXOL 350 MG/ML SOLN  COMPARISON: CT scan of June 25, 2013.  FINDINGS: CTA CHEST FINDINGS  No pneumothorax or significant pleural effusion is noted. No acute pulmonary disease is noted. There is no evidence of thoracic aortic dissection. Great vessels are widely patent. 4.8 cm ascending thoracic aortic aneurysm is noted. No mediastinal mass or adenopathy is noted. Pulmonary arteries appear normal. No significant osseous abnormality is noted in the chest.  Review of the MIP images confirms the above findings.  CTA ABDOMEN AND PELVIS FINDINGS  No gallstones are noted. No definite abnormality seen involving the liver, spleen or pancreas. Adrenal glands and kidneys appear normal. No hydronephrosis or renal obstruction is noted. No renal or ureteral  calculi are noted. The appendix appears normal. There is no evidence of bowel obstruction. Atherosclerosis of abdominal aorta is noted without dissection. The celiac, superior mesenteric and renal arteries are widely patent. 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 3.1 cm infrarenal abdominal aortic aneurysm is noted. Iliac arteries are widely patent without significant stenosis. Moderate prostatic enlargement is noted. Urinary bladder appears normal. 4.1 cm pseudo aneurysm with a large amount of mural thrombus is seen arising from a proximal branch of the proximal left superficial femoral artery. Multilevel degenerative disc disease is noted in the lumbar spine.  Review of the MIP images confirms the above findings.  IMPRESSION: No evidence of thoracic aortic dissection.  4.8 cm ascending thoracic aortic aneurysm is noted. Ascending thoracic aortic aneurysm. Recommend semi-annual imaging followup by CTA or MRA and referral to cardiothoracic surgery if not already obtained. This recommendation follows 2010 ACCF/AHA/AATS/ACR/ASA/SCA/SCAI/SIR/STS/SVM Guidelines for the Diagnosis and Management of Patients With Thoracic Aortic Disease. Circulation. 2010; 121:  [id number].  No evidence of abdominal aortic dissection. 3.1 cm infrarenal abdominal aortic aneurysm is noted.  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 There appears to be early filling of the left common femoral vein with contrast, most likely due to this pseudoaneurysm. Consultation with vascular surgery is recommended. Critical Value/emergent results were called by telephone at the time of interpretation on 07/28/2015 at 6:11 pm to Dr. Lisa Roca , who verbally acknowledged these results.   Electronically Signed  By: Marijo Conception, M.D.  On: 07/28/2015 18:13  I have independently reviewed the above radiology studies  and  reviewed the findings with the patient.  ECHO: 08/15/2015 Study Conclusions  - Left ventricle: The cavity size was normal. There was mild concentric hypertrophy. Systolic function was normal. The estimated ejection fraction was in the range of 60% to 65%. Wall motion was normal; there were no regional wall motion abnormalities. Left ventricular diastolic function parameters were normal. - Aortic valve: There was trivial regurgitation. - Mitral valve: There was mild regurgitation. - Left atrium: The atrium was normal in size. - Right ventricle: Systolic function was normal. - Pulmonary arteries: Systolic pressure was within the normal range.  Impressions:  - Normal study.   Recent Lab Findings: Lab Results  Component Value Date   WBC 5.8 07/28/2015   HGB 15.8 07/28/2015   HCT 47.2 07/28/2015   PLT 148* 07/28/2015   GLUCOSE 100* 02/11/2016   CHOL 145 09/23/2015   TRIG 130.0 09/23/2015   HDL 44.90 09/23/2015   LDLCALC 74 09/23/2015   ALT 26 06/25/2013   AST 18 06/25/2013   NA 141 02/11/2016   K 3.9 02/11/2016   CL 103 02/11/2016   CREATININE 1.23 02/11/2016   BUN 15 02/11/2016   CO2 24 02/11/2016   TSH 1.38 12/20/2015   INR 2.8 02/12/2016   Aortic Size Index=    4.7     /Body surface area is 2.39 meters squared. =1.97 < 2.75 cm/m2      4% risk per year 2.75 to 4.25          8% risk per year > 4.25 cm/m2    20% risk per year     Assessment / Plan:   Incidental finding of dilated ascending aorta without evidence of aortic insufficiency or bicuspid valve, I reviewed with patient the risks signs and symptoms of aortic dissection. With the current size surgical intervention is not indicated but close follow-up is needed, good control of blood pressure is recommended.   I discussed with the patient not engage in excessive lifting such as competitive weightlifting, Related to him It's best to avoid activities that cause grunting or straining (medically  referred to as a "valsalva maneuver"). T  According to the 2010 ACC/AHA guidelines, we recommend patients with thoracic aortic disease to maintain a LDL of less than 70 and a HDL of greater than 50. We recommend their blood pressure to remain less than 135/85. The patient was educated to take lifelong prophylactic antibiotics prior to any elective invasive procedure.  We'll plan to see the patient back for follow-up CTA of chest  scan in 8 months      Grace Isaac MD      [address] [place], [address] Office [phone]   Beeper [phone]  02/13/2016 1:06 PM

## 2016-02-13 NOTE — Patient Instructions (Signed)
It's best to avoid activities that cause grunting or straining (medically referred to as a "valsalva maneuver"). This happens when a person bears down against a closed throat to increase the strength of arm or abdominal muscles. There's often a tendency to do this when lifting heavy weights, doing sit-ups, push-ups or chin-ups, etc., but it may be harmful.     Thoracic Aortic Aneurysm An aneurysm is a bulge in an artery. It happens when the wall of the artery is weakened or damaged. If the aneurysm gets too big, it bursts (ruptures) and severe bleeding occurs. A thoracic aortic aneurysm is an aneurysm that occurs in the first part of the aorta, between the heart and the diaphragm. The aorta is the main artery and supplies blood from the heart to the rest of the body. A thoracic aortic aneurysm can enlarge and rupture or blood can flow between the layers of the wall of the aorta through a tear (aorticdissection). Both of these conditions can cause bleeding inside the body and can be life threatening unless diagnosed and treated promptly. CAUSES  The exact cause of a thoracic aortic aneurysm is often unknown. Some contributing factors are:   A hardening of the arteries caused by the buildup of fat and other substances in the lining of a blood vessel (arteriosclerosis).  Inflammation of the walls of an artery (arteritis).  Connective tissue diseases, such as Marfan syndrome.  Injury or trauma to the aorta.  An infection, such as syphilis or staphylococcus, in the wall of the aorta (infectious aortitis) caused by bacteria. RISK FACTORS  Risk factors that contribute to a thoracic aortic aneurysm may include:  Age older than 60 years.  High blood pressure (hypertension).  Male gender.  Ethnicity (white race).  Obesity.  Family history of aneurysm (first degree relatives only).  Tobacco use. PREVENTION  The following healthy lifestyle habits may help decrease your risk of a thoracic  aortic aneurysm:  Quitting smoking. Smoking can raise your blood pressure and cause arteriosclerosis.  Limiting or avoiding alcohol.  Keeping your blood pressure, blood sugar level, and cholesterol levels within normal limits.  Decreasing your salt intake. In some people, too much salt can raise blood pressure and increase your risk of abdominal aortic aneurysm.  Eating a diet low in saturated fats and cholesterol.  Increasing your fiber intake by including whole grains, vegetables, and fruits in your diet. Eating these foods may help lower blood pressure.  Maintaining a healthy weight.  Staying physically active and exercising regularly. SYMPTOMS  The symptoms of thoracic aortic aneurysm may vary depending on the size and rate of growth of the aneurysm. Most grow slowly and do not have any symptoms. When symptoms do occur, they may include:  Pain (chest, back, sides, or abdomen). The pain may vary in intensity. A sudden onset of severe pain may indicate that the aneurysm has ruptured.  Hoarseness.  Cough.  Shortness of breath.  Swallowing problems.  Nausea or vomiting or both. DIAGNOSIS  Since most unruptured thoracic aortic aneurysms have no symptoms, they are often discovered during diagnostic exams for other conditions. An aneurysm may be found during the following procedures:  Ultrasonography (a one-time screening for thoracic aortic aneurysm by ultrasonography is also recommended for all men aged 65-75 years who have ever smoked).  X-ray exams.  A CT scan.  An MRI.  Angiography or arteriography. TREATMENT  Treatment of a thoracic aortic aneurysm depends on the size of your aneurysm, your age, and risk factors for   rupture. Medicine to control blood pressure and pain may be used to manage aneurysms smaller than 2.3 in (6 cm). Regular monitoring for enlargement may be recommended by your health care provider if:  The aneurysm is 1.2-1.5 in (3-4 cm) in size (an annual  ultrasonography may be recommended).  The aneurysm is 1.5-1.8 in (4-4.5 cm) in size (an ultrasonography every 6 months may be recommended).  The aneurysm is larger than 1.8 in (4.5 cm) in size (your health care provider may ask that you be examined by a vascular surgeon). If your aneurysm is larger than 2.2 in (5.5 cm) or if it is enlarging quickly, surgical repair may be recommended. There are two main methods for repair of an aneurysm:   Endovascular repair (a minimally invasive surgery).  Open repair. This method is used if an endovascular repair is not possible.   This information is not intended to replace advice given to you by your health care provider. Make sure you discuss any questions you have with your health care provider.   Document Released: 11/02/2005 Document Revised: 08/23/2013 Document Reviewed: 05/15/2013 Elsevier Interactive Patient Education 2016 Elsevier Inc[employer]  

## 2016-02-17 ENCOUNTER — Ambulatory Visit (HOSPITAL_COMMUNITY)
Admission: RE | Admit: 2016-02-17 | Discharge: 2016-02-17 | Disposition: A | Discharge status: Home / Self Care | Payer: Medicare Other | Source: Ambulatory Visit | Attending: Internal Medicine | Admitting: Internal Medicine

## 2016-02-17 ENCOUNTER — Other Ambulatory Visit: Payer: Self-pay

## 2016-02-17 ENCOUNTER — Inpatient Hospital Stay (HOSPITAL_COMMUNITY)
Admission: RE | Admit: 2016-02-17 | Discharge: 2016-02-20 | DRG: 310 | Disposition: A | Discharge status: Home / Self Care | Payer: Medicare Other | Source: Ambulatory Visit | Attending: Internal Medicine | Admitting: Internal Medicine

## 2016-02-17 ENCOUNTER — Encounter (HOSPITAL_COMMUNITY)
Admission: RE | Disposition: A | Discharge status: Home / Self Care | Payer: Self-pay | Source: Ambulatory Visit | Attending: Internal Medicine

## 2016-02-17 ENCOUNTER — Encounter (HOSPITAL_COMMUNITY): Payer: Self-pay | Admitting: *Deleted

## 2016-02-17 DIAGNOSIS — I481 Persistent atrial fibrillation: Secondary | ICD-10-CM | POA: Diagnosis present

## 2016-02-17 DIAGNOSIS — I712 Thoracic aortic aneurysm, without rupture: Secondary | ICD-10-CM | POA: Diagnosis present

## 2016-02-17 DIAGNOSIS — Z7901 Long term (current) use of anticoagulants: Secondary | ICD-10-CM

## 2016-02-17 DIAGNOSIS — I4891 Unspecified atrial fibrillation: Secondary | ICD-10-CM

## 2016-02-17 DIAGNOSIS — I714 Abdominal aortic aneurysm, without rupture: Secondary | ICD-10-CM | POA: Diagnosis present

## 2016-02-17 DIAGNOSIS — I10 Essential (primary) hypertension: Secondary | ICD-10-CM | POA: Diagnosis present

## 2016-02-17 DIAGNOSIS — Z5181 Encounter for therapeutic drug level monitoring: Secondary | ICD-10-CM | POA: Diagnosis not present

## 2016-02-17 DIAGNOSIS — I4819 Other persistent atrial fibrillation: Secondary | ICD-10-CM

## 2016-02-17 DIAGNOSIS — Z8249 Family history of ischemic heart disease and other diseases of the circulatory system: Secondary | ICD-10-CM

## 2016-02-17 DIAGNOSIS — G4733 Obstructive sleep apnea (adult) (pediatric): Secondary | ICD-10-CM | POA: Diagnosis present

## 2016-02-17 DIAGNOSIS — E785 Hyperlipidemia, unspecified: Secondary | ICD-10-CM | POA: Diagnosis present

## 2016-02-17 DIAGNOSIS — H9190 Unspecified hearing loss, unspecified ear: Secondary | ICD-10-CM | POA: Diagnosis present

## 2016-02-17 DIAGNOSIS — E876 Hypokalemia: Secondary | ICD-10-CM | POA: Diagnosis not present

## 2016-02-17 DIAGNOSIS — E739 Lactose intolerance, unspecified: Secondary | ICD-10-CM | POA: Diagnosis present

## 2016-02-17 DIAGNOSIS — I480 Paroxysmal atrial fibrillation: Secondary | ICD-10-CM | POA: Diagnosis present

## 2016-02-17 DIAGNOSIS — Z79899 Other long term (current) drug therapy: Secondary | ICD-10-CM | POA: Diagnosis not present

## 2016-02-17 DIAGNOSIS — I724 Aneurysm of artery of lower extremity: Secondary | ICD-10-CM | POA: Diagnosis present

## 2016-02-17 HISTORY — DX: Low back pain: M54.5

## 2016-02-17 HISTORY — DX: Dependence on other enabling machines and devices: Z99.89

## 2016-02-17 HISTORY — DX: Dependence on supplemental oxygen: Z99.81

## 2016-02-17 HISTORY — DX: Unspecified osteoarthritis, unspecified site: M19.90

## 2016-02-17 HISTORY — PX: TEE WITHOUT CARDIOVERSION: SHX5443

## 2016-02-17 HISTORY — DX: Low back pain, unspecified: M54.50

## 2016-02-17 HISTORY — DX: Other chronic pain: G89.29

## 2016-02-17 HISTORY — DX: Aortic aneurysm of unspecified site, without rupture: I71.9

## 2016-02-17 HISTORY — DX: Gastro-esophageal reflux disease without esophagitis: K21.9

## 2016-02-17 HISTORY — DX: Obstructive sleep apnea (adult) (pediatric): G47.33

## 2016-02-17 LAB — MAGNESIUM: MAGNESIUM: 1.9 mg/dL (ref 1.7–2.4)

## 2016-02-17 LAB — POTASSIUM
POTASSIUM: 3.5 mmol/L (ref 3.5–5.1)
POTASSIUM: 4.5 mmol/L (ref 3.5–5.1)

## 2016-02-17 LAB — PROTIME-INR
INR: 3.4 — ABNORMAL HIGH (ref 0.00–1.49)
PROTHROMBIN TIME: 33.6 s — AB (ref 11.6–15.2)

## 2016-02-17 LAB — BASIC METABOLIC PANEL
Anion gap: 10 (ref 5–15)
BUN: 9 mg/dL (ref 6–20)
CHLORIDE: 104 mmol/L (ref 101–111)
CO2: 26 mmol/L (ref 22–32)
Calcium: 8.5 mg/dL — ABNORMAL LOW (ref 8.9–10.3)
Creatinine, Ser: 1.04 mg/dL (ref 0.61–1.24)
GFR calc non Af Amer: >60 mL/min (ref >60)
Glucose, Bld: 99 mg/dL (ref 65–99)
POTASSIUM: 3.6 mmol/L (ref 3.5–5.1)
SODIUM: 140 mmol/L (ref 135–145)

## 2016-02-17 SURGERY — ECHOCARDIOGRAM, TRANSESOPHAGEAL
Anesthesia: Moderate Sedation

## 2016-02-17 MED ORDER — SODIUM CHLORIDE 0.9 % IV SOLN: 250.0000 mL | INTRAVENOUS | Status: DC | PRN

## 2016-02-17 MED ORDER — TAMSULOSIN HCL 0.4 MG PO CAPS
0.4000 mg | ORAL_CAPSULE | Freq: Every day | ORAL | Status: DC
  Administered 2016-02-18 – 2016-02-20 (×3): 0.4 mg via ORAL
  Filled 2016-02-17 (×3): qty 1

## 2016-02-17 MED ORDER — LORATADINE 10 MG PO TABS
10.0000 mg | ORAL_TABLET | Freq: Every day | ORAL | Status: DC
  Administered 2016-02-17 – 2016-02-20 (×4): 10 mg via ORAL
  Filled 2016-02-17 (×4): qty 1

## 2016-02-17 MED ORDER — WARFARIN - PHARMACIST DOSING INPATIENT: Freq: Every day | Status: DC

## 2016-02-17 MED ORDER — SODIUM CHLORIDE 0.9% FLUSH: 3.0000 mL | INTRAVENOUS | Status: DC | PRN

## 2016-02-17 MED ORDER — SODIUM CHLORIDE 0.9% FLUSH
3.0000 mL | Freq: Two times a day (BID) | INTRAVENOUS | Status: DC
  Administered 2016-02-17 – 2016-02-19 (×5): 3 mL via INTRAVENOUS

## 2016-02-17 MED ORDER — DOFETILIDE 250 MCG PO CAPS
500.0000 ug | ORAL_CAPSULE | Freq: Two times a day (BID) | ORAL | Status: DC
  Administered 2016-02-17: 500 ug via ORAL
  Filled 2016-02-17: qty 2

## 2016-02-17 MED ORDER — SODIUM CHLORIDE 0.9 % IV SOLN
INTRAVENOUS | Status: DC
  Administered 2016-02-17: 500 mL via INTRAVENOUS

## 2016-02-17 MED ORDER — BUTAMBEN-TETRACAINE-BENZOCAINE 2-2-14 % EX AERO
INHALATION_SPRAY | CUTANEOUS | Status: DC | PRN
  Administered 2016-02-17: 2 via TOPICAL

## 2016-02-17 MED ORDER — MIDAZOLAM HCL 5 MG/ML IJ SOLN
INTRAMUSCULAR | Status: AC
  Filled 2016-02-17: qty 2

## 2016-02-17 MED ORDER — POTASSIUM CHLORIDE 20 MEQ/15ML (10%) PO SOLN
40.0000 meq | Freq: Every day | ORAL | Status: DC
  Administered 2016-02-17 – 2016-02-20 (×4): 40 meq via ORAL
  Filled 2016-02-17 (×4): qty 30

## 2016-02-17 MED ORDER — FENTANYL CITRATE (PF) 100 MCG/2ML IJ SOLN
INTRAMUSCULAR | Status: AC
  Filled 2016-02-17: qty 2

## 2016-02-17 MED ORDER — DIPHENHYDRAMINE HCL 50 MG/ML IJ SOLN
INTRAMUSCULAR | Status: AC
  Filled 2016-02-17: qty 1

## 2016-02-17 MED ORDER — PANTOPRAZOLE SODIUM 40 MG PO TBEC
40.0000 mg | DELAYED_RELEASE_TABLET | Freq: Every day | ORAL | Status: DC
  Administered 2016-02-18 – 2016-02-20 (×3): 40 mg via ORAL
  Filled 2016-02-17 (×3): qty 1

## 2016-02-17 MED ORDER — DOFETILIDE 250 MCG PO CAPS: 500.0000 ug | ORAL_CAPSULE | Freq: Two times a day (BID) | ORAL | Status: DC

## 2016-02-17 MED ORDER — MIDAZOLAM HCL 10 MG/2ML IJ SOLN
INTRAMUSCULAR | Status: DC | PRN
  Administered 2016-02-17 (×2): 2 mg via INTRAVENOUS

## 2016-02-17 MED ORDER — POTASSIUM CHLORIDE CRYS ER 20 MEQ PO TBCR
40.0000 meq | EXTENDED_RELEASE_TABLET | Freq: Once | ORAL | Status: AC
  Administered 2016-02-17: 40 meq via ORAL
  Filled 2016-02-17: qty 2

## 2016-02-17 MED ORDER — LACTASE 3000 UNITS PO TABS
6000.0000 [IU] | ORAL_TABLET | Freq: Every day | ORAL | Status: DC
  Administered 2016-02-18 – 2016-02-20 (×3): 6000 [IU] via ORAL
  Filled 2016-02-17 (×5): qty 2

## 2016-02-17 MED ORDER — FENTANYL CITRATE (PF) 100 MCG/2ML IJ SOLN
INTRAMUSCULAR | Status: DC | PRN
  Administered 2016-02-17 (×2): 25 ug via INTRAVENOUS

## 2016-02-17 MED ORDER — ATORVASTATIN CALCIUM 10 MG PO TABS
10.0000 mg | ORAL_TABLET | Freq: Every day | ORAL | Status: DC
  Administered 2016-02-17 – 2016-02-20 (×4): 10 mg via ORAL
  Filled 2016-02-17 (×4): qty 1

## 2016-02-17 MED ORDER — MAGNESIUM SULFATE IN D5W 10-5 MG/ML-% IV SOLN
1.0000 g | Freq: Once | INTRAVENOUS | Status: AC
  Administered 2016-02-17: 1 g via INTRAVENOUS
  Filled 2016-02-17: qty 100

## 2016-02-17 MED ORDER — WARFARIN SODIUM 5 MG PO TABS
5.0000 mg | ORAL_TABLET | Freq: Once | ORAL | Status: AC
  Administered 2016-02-17: 5 mg via ORAL
  Filled 2016-02-17: qty 1

## 2016-02-17 NOTE — Progress Notes (Signed)
  Echocardiogram Echocardiogram Transesophageal has been performed.  David Rice 02/17/2016, 11:09 AM

## 2016-02-17 NOTE — Op Note (Signed)
INDICATIONS: atrial fibrillation  PROCEDURE:   Informed consent was obtained prior to the procedure. The risks, benefits and alternatives for the procedure were discussed and the patient comprehended these risks.  Risks include, but are not limited to, cough, sore throat, vomiting, nausea, somnolence, esophageal and stomach trauma or perforation, bleeding, low blood pressure, aspiration, pneumonia, infection, trauma to the teeth and death.    After a procedural time-out, the oropharynx was anesthetized with 20% benzocaine spray. The patient was given 4 mg versed and 50 mcg fentanyl for moderate sedation.   The transesophageal probe was inserted in the esophagus and stomach without difficulty and multiple views were obtained.  The patient was kept under observation until the patient left the procedure room.  The patient left the procedure room in stable condition.   Agitated microbubble saline contrast was not administered.  COMPLICATIONS:    There were no immediate complications.  FINDINGS:  No left atrial thrombus seen   RECOMMENDATIONS:    OK to proceed with tikosyn loading.  Time Spent Directly with the Patient:  30 minutes  Total monitoring time Sedation initiated 1023h  David Rice 02/17/2016, 10:53 AM

## 2016-02-17 NOTE — Progress Notes (Signed)
Pharmacy Review for Dofetilide (Tikosyn) Initiation  Admit Complaint: 70 y.o. male admitted 02/17/2016 with atrial fibrillation to be initiated on dofetilide.   Assessment:  Patient Exclusion Criteria: If any screening criteria checked as "Yes", then  patient  should NOT receive dofetilide until criteria item is corrected. If "Yes" please indicate correction plan.  YES  NO Patient  Exclusion Criteria Correction Plan  [x]  []  Baseline QTc interval is greater than or equal to 440 msec. IF above YES box checked dofetilide contraindicated unless patient has ICD; then may proceed if QTc 500-550 msec or with known ventricular conduction abnormalities may proceed with QTc 550-600 msec. QTc =     []  [x]  Magnesium level is less than 1.8 mEq/l : Last magnesium:  Lab Results  Component Value Date   MG 1.9 02/17/2016         [x]  []  Potassium level is less than 4 mEq/l : Last potassium:  Lab Results  Component Value Date   K 3.6 02/17/2016         []  [x]  Patient is known or suspected to have a digoxin level greater than 2 ng/ml: No results found for: DIGOXIN    []  [x]  Creatinine clearance less than 20 ml/min (calculated using Cockcroft-Gault, actual body weight and serum creatinine): Estimated Creatinine Clearance: 88.2 mL/min (by C-G formula based on Cr of 1.04).    []  [x]  Patient has received drugs known to prolong the QT intervals within the last 48 hours (phenothiazines, tricyclics or tetracyclic antidepressants, erythromycin, H-1 antihistamines, cisapride, fluoroquinolones, azithromycin). Drugs not listed above may have an, as yet, undetected potential to prolong the QT interval, updated information on QT prolonging agents is available at this website:QT prolonging agents   []  [x]  Patient received a dose of hydrochlorothiazide (Oretic) alone or in any combination including triamterene (Dyazide, Maxzide) in the last 48 hours.   []  [x]  Patient received a medication known to increase dofetilide  plasma concentrations prior to initial dofetilide dose:  . Trimethoprim (Primsol, Proloprim) in the last 36 hours . Verapamil (Calan, Verelan) in the last 36 hours or a sustained release dose in the last 72 hours . Megestrol (Megace) in the last 5 days  . Cimetidine (Tagamet) in the last 6 hours . Ketoconazole (Nizoral) in the last 24 hours . Itraconazole (Sporanox) in the last 48 hours  . Prochlorperazine (Compazine) in the last 36 hours    []  [x]  Patient is known to have a history of torsades de pointes; congenital or acquired long QT syndromes.   []  [x]  Patient has received a Class 1 antiarrhythmic with less than 2 half-lives since last dose. (Disopyramide, Quinidine, Procainamide, Lidocaine, Mexiletine, Flecainide, Propafenone)   []  [x]  Patient has received amiodarone therapy in the past 3 months or amiodarone level is greater than 0.3 ng/ml.     Ordering provider was confirmed at [email or web address] if they are not listed on the [facility] Prescribers list.  Goal of Therapy: Follow renal function, electrolytes, potential drug interactions, and dose adjustment. Provide education and 1 week supply at discharge.  Plan:  [x]   Physician selected initial dose within range recommended for patients level of renal function - will monitor for response.  []   Physician selected initial dose outside of range recommended for patients level of renal function - will discuss if the dose should be altered at this time.   Select One Calculated CrCl  Dose q12h  [x]  > 60 ml/min 500 mcg  []  40-60 ml/min 250 mcg  []  20-40  ml/min 125 mcg   2. Follow up QTc after the first 5 doses, renal function, electrolytes (K & Mg) daily x 3     days, dose adjustment, success of initiation and facilitate 1 week discharge supply as     clinically indicated.  3. Initiate Tikosyn education video (Call [phone] and ask for video # [phone]).  4. Place Enrollment Form on the chart for discharge supply of  dofetilide.   Reginia Naas 3:43 PM 02/17/2016

## 2016-02-17 NOTE — Progress Notes (Signed)
ANTICOAGULATION CONSULT NOTE - Initial Consult  Pharmacy Consult for warfarin Indication: atrial fibrillation  Allergies  Allergen Reactions  . Lactose Intolerance (Gi)     Patient Measurements: Height: 6\' 2"  (188 cm) Weight: 240 lb 8 oz (109.09 kg) IBW/kg (Calculated) : 82.2  Vital Signs: Temp: 98 F (36.7 C) (04/03 1511) Temp Source: Oral (04/03 1511) BP: 145/86 mmHg (04/03 1511) Pulse Rate: 58 (04/03 1511)  Labs:  Recent Labs  02/17/16 1405  LABPROT 33.6*  INR 3.40*  CREATININE 1.04    Estimated Creatinine Clearance: 88.2 mL/min (by C-G formula based on Cr of 1.04).   Medical History: Past Medical History  Diagnosis Date  . A-fib ([facility])   . Bowel obstruction ([place])   . Hearing impaired   . Diverticulitis   . Hypertension   . Thoracic aortic aneurysm without rupture ([place])   . Sleep apnea   . AAA (abdominal aortic aneurysm) ([place])   . Essential hypertension   . Hyperlipidemia   . Persistent atrial fibrillation [facility])     Status post ablation in July 2016 in [place].   Assessment: 64 yoM with PMHx of afib admitted for TEE and plans for Tikosyn initiation. INR 3.4 on admitted, with last dose on 4/2 pm. CBC not obtained but will order for 4/4 am.  PTA dose:  5 mg daily except 7.5 mg on Mon, Wed, Fri and Sat  Goal of Therapy:  INR 2-3 Monitor platelets by anticoagulation protocol: Yes   Plan:  1. INR slightly above desired range will give warfarin 5 mg x 1 this evening 2. Daily INR and CBC (ordered for 4/4)   Vincenza Hews, PharmD, BCPS 02/17/2016, 6:34 PM Pager: [phone]

## 2016-02-17 NOTE — H&P (Signed)
H&P    Patient ID: David Rice MRN: [id number], DOB/AGE: February 02, 1946 70 y.o.  Admit date: 02/17/2016  Primary Physician: Crecencio Mc, MD Primary Cardiologist: Dr. Fletcher Anon Electrophysiologist: Dr. Caryl Comes  Reason for Admission: Tikosyn initiation  HPI: David Rice is a 70 y.o. male with PAFib, HLD, hearing impairment, comes to [facility] today for TEE and plans for Tiksoyn intiation.  He is feeling well, denies any CP, SOB, no dizziness, near syncope or syncope.  He was last seen by Dr. Caryl Comes in Feb with ongoing c/o palpitations associated with episodes of AFib and PVCs documented by [facility].  At that visit Dr. Olin Pia not discusses given previousl AFib ablation associated with a L femoral pseudoaneurysm he was not looking to pursue another ablation procedure and Tikosyn felt to be the next best step.  Arrangements were made to have TEE and Tikosyn initiation arranged after his visit in Feb with Dr. Caryl Comes, but scheduling was delayed for unclear reasons and then had to be rescheduled for what he was told was an emergency that occurred in the department and was scheduled for today.   He was diagnosed with atrial fibrillation in December 2015 in [place]. He was initially treated with atenolol and flecainide but did not tolerate the medications due to extreme fatigue. He ultimately underwent ablation in July of 2016. He was historically on Flecainide quit sometime in Dec 2016 potentially though probably earlier, he is certain none in 0000000 Propafenone is mentioned but he does not recall this medicine and none in 2017 for sure.  The patient is bothered by palpitations/AFib episodes that make him feel weak somewhat "jittery" with no energy at all.      Past Medical History  Diagnosis Date  . A-fib ([place])   . Bowel obstruction ([place])   . Hearing impaired   . Diverticulitis   . Hypertension   . Thoracic aortic aneurysm without rupture ([place])   . Sleep apnea   . AAA (abdominal aortic aneurysm)  ([place])   . Essential hypertension   . Hyperlipidemia   . Persistent atrial fibrillation [facility])     Status post ablation in July 2016 in [place].     Surgical History:  Past Surgical History  Procedure Laterality Date  . Cardiac electrophysiology mapping and ablation    . Abdominal surgery    . Hernia repair    . Peripheral vascular catheterization N/A 08/02/2015    Procedure: Abdominal Aortogram w/Lower Extremity;  Surgeon: Katha Cabal, MD;  Location: [facility];  Service: Cardiovascular;  Laterality: N/A;  . Peripheral vascular catheterization  08/02/2015    Procedure: Lower Extremity Intervention;  Surgeon: Katha Cabal, MD;  Location: [facility];  Service: Cardiovascular;;  . Cardiac catheterization  11/2014    [place]     Prescriptions prior to admission  Medication Sig Dispense Refill Last Dose  . atorvastatin (LIPITOR) 10 MG tablet Take 1 tablet (10 mg total) by mouth daily. 90 tablet 3 02/16/2016 at 2200  . esomeprazole (NEXIUM) 20 MG capsule Take 20 mg by mouth daily at 12 noon.   02/17/2016 at 0800  . Lactase (LACTAID PO) Take by mouth. Take 2 capsules daily.   02/17/2016 at 0800  . loratadine (CLARITIN) 10 MG tablet Take 10 mg by mouth daily.   02/16/2016 at 2200  . mometasone (NASONEX) 50 MCG/ACT nasal spray Place 2 sprays into the nose 2 (two) times daily. 17 g 12 Past Week  .  silodosin (RAPAFLO) 8 MG CAPS capsule Take 8 mg by mouth at bedtime.   02/16/2016 at 2200  . traMADol (ULTRAM) 50 MG tablet Take 1 tablet (50 mg total) by mouth every 6 (six) hours as needed. 90 tablet 2 02/16/2016 at 2200  . warfarin (COUMADIN) 5 MG tablet Take as directed by [facility] 45 tablet 3 02/16/2016 at 2200    Inpatient Medications:  . atorvastatin  10 mg Oral Daily  . dofetilide  500 mcg Oral BID  . loratadine  10 mg Oral Daily  . [START ON 02/18/2016] pantoprazole  40 mg Oral Daily  . potassium chloride  40 mEq Oral Daily  . sodium chloride  flush  3 mL Intravenous Q12H  . tamsulosin  0.4 mg Oral Daily  . warfarin  5 mg Oral Once  . [START ON 02/18/2016] Warfarin - Pharmacist Dosing Inpatient   Does not apply q1800    Allergies:  Allergies  Allergen Reactions  . Lactose Intolerance (Gi)     Social History   Social History  . Marital Status: Single    Spouse Name: N/A  . Number of Children: N/A  . Years of Education: N/A   Occupational History  . Not on file.   Social History Main Topics  . Smoking status: Never Smoker   . Smokeless tobacco: Not on file  . Alcohol Use: 1.2 oz/week    2 Glasses of wine per week     Comment: 2 glasses of wine aabout 3 times per week  . Drug Use: No  . Sexual Activity: Not on file   Other Topics Concern  . Not on file   Social History Narrative   He lives alone, he is engaged.    He is a retired Engineer, maintenance (IT) for 40 years.        Family History  Problem Relation Age of Onset  . Hypertension Mother     Living 18  . Lung cancer Father     Deceased, 25  . Hypertension Brother   . Healthy Son   . Healthy Daughter      Review of Systems: All other systems reviewed and are otherwise negative except as noted above.  Physical Exam: Filed Vitals:   02/17/16 1145 02/17/16 1150 02/17/16 1151 02/17/16 1511  BP: 121/76   145/86  Pulse: 71 63 63 58  Temp:    98 F (36.7 C)  TempSrc:    Oral  Resp: 20 21 21 18   Height:    6\' 2"  (1.88 m)  Weight:    240 lb 8 oz (109.09 kg)  SpO2: 96% 100% 100% 98%    GEN- The patient is well appearing, alert and oriented x 3 today.   HEENT: normocephalic, atraumatic; sclera clear, conjunctiva pink; hearing intact; oropharynx clear; neck supple, no JVP Lymph- no cervical lymphadenopathy Lungs- Clear to ausculation bilaterally, normal work of breathing.  No wheezes, rales, rhonchi Heart- Regular rate and rhythm, no significant murmurs, rubs or gallops, PMI not laterally displaced GI- soft, non-tender, non-distended, bowel sounds  present Extremities- no clubbing, cyanosis, or edema MS- no significant deformity or atrophy Skin- warm and dry, no rash or lesion Psych- euthymic mood, full affect Neuro- no gross deficits observed  Labs:   Lab Results  Component Value Date   WBC 5.8 07/28/2015   HGB 15.8 07/28/2015   HCT 47.2 07/28/2015   MCV 93.1 07/28/2015   PLT 148* 07/28/2015     Recent Labs Lab 02/17/16 1405 02/17/16 1746  NA 140  --   K 3.6 3.5  CL 104  --   CO2 26  --   BUN 9  --   CREATININE 1.04  --   CALCIUM 8.5*  --   GLUCOSE 99  --       Radiology/Studies:  Ct Angio Chest W/cm &/or Wo Cm 02/13/2016  CLINICAL DATA:  Followup thoracic aortic aneurysm. Chest pain and shortness of breath. EXAM: CT ANGIOGRAPHY CHEST WITH CONTRAST TECHNIQUE: Multidetector CT imaging of the chest was performed using the standard protocol during bolus administration of intravenous contrast. Multiplanar CT image reconstructions and MIPs were obtained to evaluate the vascular anatomy. CONTRAST:  75 cc Isovue 370 COMPARISON:  07/28/2015 FINDINGS: Mediastinum/Nodes: No chest wall mass, supraclavicular or axillary lymphadenopathy. Small scattered lymph nodes. The thyroid gland is grossly normal. Mild stable cardiac enlargement. No pericardial effusion. Stable fusiform aneurysm on the attachment of the ascending aorta with maximal measurement of 48 x 47 mm. No dissection. The pulmonary arteries appear normal. No mediastinal or hilar mass or adenopathy. Stable small scattered lymph nodes. Esophagus is grossly normal. Lungs/Pleura: No acute pulmonary findings. Dependent subpleural atelectasis is noted. No worrisome pulmonary lesions. No bronchiectasis or interstitial lung disease. Upper abdomen: No significant findings. Musculoskeletal: No significant findings. Moderate stable degenerative changes involving the thoracic spine. Review of the MIP images confirms the above findings. IMPRESSION: 1. Stable fusiform aneurysmal dilatation of  the ascending aorta with maximal measurement of 48 x 47 mm. No dissection. Ascending thoracic aortic aneurysm. Recommend semi-annual imaging followup by CTA or MRA and referral to cardiothoracic surgery if not already obtained. This recommendation follows 2010 ACCF/AHA/AATS/ACR/ASA/SCA/SCAI/SIR/STS/SVM Guidelines for the Diagnosis and Management of Patients With Thoracic Aortic Disease. Circulation. 2010; 121: [id number] 2. Stable mild cardiac enlargement. 3. No acute or significant pulmonary findings. Electronically Signed   By: Marijo Sanes M.D.   On: 02/13/2016 10:53    EKG: SR, QTc 455, (reviewed with Dr. Lovena Le ok to start Tikosyn) TELEMETRY: pending telemetry bed  02/17/16: TEE Study Conclusions - Left ventricle: The cavity size was normal. There was mild  concentric hypertrophy. Systolic function was normal. The  estimated ejection fraction was in the range of 55% to 60%. Wall  motion was normal; there were no regional wall motion  abnormalities. - Aortic valve: No evidence of vegetation. No evidence of  vegetation. There was mild regurgitation. - Mitral valve: Mild, late systolicprolapse, involving the  posterior leaflet. No evidence of vegetation. There was mild  regurgitation. - Left atrium: No evidence of thrombus in the atrial cavity or  appendage. No spontaneous echo contrast was observed. - Right atrium: No evidence of thrombus in the atrial cavity or  appendage. No evidence of thrombus in the atrial cavity or  appendage. - Atrial septum: No defect or patent foramen ovale was identified. - Pulmonic valve: There was mild to moderate regurgitation directed  eccentrically.   Assessment and Plan:   1. Paroxysmal AFib     Plans for Tikosyn initiation     K+ 3.6, replacement ordered and repeat lab ordered     Mag 1.9, will give 1 gm IV     Creat 1.04 (creat Cl is 104.08) will start 557mcg BID when K+ >/= to 4.0 on recheck     CHA2Ds2Vasc is at least one for age,  on warfarin, INR is 3.4, pharmacy consult for management   Signed, Jodelle Green 02/17/2016 6:56 PM  EP attending  Patient seen and examined. I've reviewed the findings  as noted above and concur. The patient is a 70 year old man with persistent atrial fibrillation who was admitted to undergo initiation of dofetilide therapy. He has previously undergone catheter ablation. His cardiovascular exam reveals an regular rate and rhythm, his lungs are clear, extremities demonstrate no edema, and he is very difficult time hearing. His EKG demonstrates sinus rhythm. His QT interval is borderline, around 450 ms corrected. Laboratory exam is notable for normal kidney function and I reduced potassium.  Assessment and plan 1. Atrial fibrillation, paroxysmal -our plan will be to admit the patient to the hospital and initiate dofetilide therapy. 2. Hypokalemia - he will be supplemented and his potassium levels will be followed while he is in the hospital 3. Hypertension - he will continue his current medications 4. Sleep apnea - he will continue to use his home C Pap. 5. Disposition - the patient will be anticipated to be hospitalized for 3 days while initiating dofetilide therapy.  Cristopher Peru, M.D.

## 2016-02-17 NOTE — Interval H&P Note (Signed)
History and Physical Interval Note:  02/17/2016 9:55 AM  David Rice  has presented today for surgery, with the diagnosis of   PRE TIKOSYN   The various methods of treatment have been discussed with the patient and family. After consideration of risks, benefits and other options for treatment, the patient has consented to  Procedure(s): TRANSESOPHAGEAL ECHOCARDIOGRAM (TEE) (N/A) as a surgical intervention .  The patient's history has been reviewed, patient examined, no change in status, stable for surgery.  I have reviewed the patient's chart and labs.  Questions were answered to the patient's satisfaction.     [name]

## 2016-02-17 NOTE — H&P (View-Only) (Signed)
[address]       [place] [address]             [phone]                    [name] [place] Medical Record [id number] Date of Birth: 06-28-1946  Referring: [name], MD Primary Care: Crecencio Mc, MD  Chief Complaint:    Chief Complaint  Patient presents with  . Thoracic Aortic Aneurysm    6 month f/u CTA Chest 02/13/16    History of Present Illness:    David Rice 70 y.o. male is seen in the office  today forFollow-up on  dilated ascending aorta first noted at cardiac catheterization done at [facility] in the summer of 2016 during the evaluation for atrial fibrillation and cardiac catheterization. The patient notes the onset of atrial fibrillation in the past year and notes that his "life went into the toilet" with the side effects from drugs used to treat his atrial fibrillation. At the time he was living in [place] and being treated in [place].  At the time of catheterization he was noted to have a dilated ascending aorta, and subsequently had a CTA of the chest abdomen and pelvis. 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 fib ablation procedure previously done. This pseudoaneurysm was occluded endovascularly at [facility] in September 2016.   Echo cardiogram basically showed a normal heart, the CT scan also found an incidental 4.7 cm ascending aortic dilatation.  The patient has no stigmata of Marfan's or other connective tissue disorder, he has no family history of early sudden death or known dissection or descending aneurysm. The patient's father died at age 109 with lung cancer was a long-term smoker, his mother is still alive and doing well at age 62, he has one brother with mental disorder but no known aneurysm he has 2 sons that are alive and healthy and well.   Patient is to be admitted next week for a TEE and medically  cardioverted. CL he will likely only and to record EKG strip. Continues to have significant symptoms related to A. fib and rate control.  Current Activity/ Functional Status:  Patient is independent with mobility/ambulation, transfers, ADL's, IADL's.   Zubrod Score: At the time of surgery this patient's most appropriate activity status/ level should be described as: [x]     0    Normal activity, no symptoms []     1    Restricted in physical strenuous activity but ambulatory, able to do out light work []     2    Ambulatory and capable of self care, unable to do work activities, up and about               >50 % of waking hours                              []     3    Only limited self care, in bed greater than 50% of waking hours []     4    Completely disabled, no self care, confined to bed or chair []     5    Moribund   Past Medical History  Diagnosis Date  . A-fib ([place])   . Bowel obstruction ([place])   .  Hearing impaired   . Diverticulitis   . Hypertension   . Thoracic aortic aneurysm without rupture ([place])   . Sleep apnea   . AAA (abdominal aortic aneurysm) ([place])   . Essential hypertension   . Hyperlipidemia   . Persistent atrial fibrillation [facility])     Status post ablation in July 2016 in [place].    Past Surgical History  Procedure Laterality Date  . Cardiac electrophysiology mapping and ablation    . Abdominal surgery    . Hernia repair    . Peripheral vascular catheterization N/A 08/02/2015    Procedure: Abdominal Aortogram w/Lower Extremity;  Surgeon: Katha Cabal, MD;  Location: [place] [facility];  Service: Cardiovascular;  Laterality: N/A;  . Peripheral vascular catheterization  08/02/2015    Procedure: Lower Extremity Intervention;  Surgeon: Katha Cabal, MD;  Location: [place] [facility];  Service: Cardiovascular;;  . Cardiac catheterization  11/2014    [place]    Family History  Problem Relation Age of Onset  . Hypertension Mother       Living 56  . Lung cancer Father     Deceased, 75  . Hypertension Brother   . Healthy Son   . Healthy Daughter     Social History  Patient's wife died at age 72, he raised 2 small children on his own for the next 81 years Social History  . Marital Status: Single    Spouse Name: N/A  . Number of Children: N/A  . Years of Education: N/A   Occupational History  .  patient retired as [employer]    Social History Main Topics  . Smoking status: Never Smoker   . Smokeless tobacco: Not on file  . Alcohol Use: 1.2 oz/week    2 Glasses of wine per week  . Drug Use: No  . Sexual Activity: Not on file   Other Topics Concern  . Not on file   Social History Narrative    History  Smoking status  . Never Smoker   Smokeless tobacco  . Not on file    History  Alcohol Use  . 1.2 oz/week  . 2 Glasses of wine per week    Comment: 2 glasses of wine aabout 3 times per week     Allergies  Allergen Reactions  . Lactose Intolerance (Gi)     Current Outpatient Prescriptions  Medication Sig Dispense Refill  . atorvastatin (LIPITOR) 10 MG tablet Take 1 tablet (10 mg total) by mouth daily. 90 tablet 3  . esomeprazole (NEXIUM) 20 MG capsule Take 20 mg by mouth daily at 12 noon.    . Lactase (LACTAID PO) Take by mouth. Take 2 capsules daily.    Marland Kitchen loratadine (CLARITIN) 10 MG tablet Take 10 mg by mouth daily.    . mometasone (NASONEX) 50 MCG/ACT nasal spray Place 2 sprays into the nose 2 (two) times daily. 17 g 12  . silodosin (RAPAFLO) 8 MG CAPS capsule Take 8 mg by mouth at bedtime.    . traMADol (ULTRAM) 50 MG tablet Take 1 tablet (50 mg total) by mouth every 6 (six) hours as needed. 90 tablet 2  . warfarin (COUMADIN) 5 MG tablet Take as directed by [facility] 45 tablet 3   No current facility-administered medications for this visit.      Review of Systems:     Cardiac Review of Systems: Y or N  Chest Pain [  n  ]  Resting SOB [  n  ] Exertional SOB  Blue.Reese  ]  Orthopnea n[  ]    Pedal Edema [ y  ]    Palpitations Blue.Reese  ] Syncope  [ n ]   Presyncope [n   ]  General Review of Systems: [Y] = yes [  ]=no Constitional: recent weight change [  ];  Wt loss over the last 3 months [   ] anorexia [  ]; fatigue [ x ]; nausea [  ]; night sweats [  ]; fever [  ]; or chills [  ];          Dental: poor dentition[  ]; Last Dentist visit:   Eye : blurred vision [  ]; diplopia [   ]; vision changes [  ];  Amaurosis fugax[  ]; Resp: cough [  ];  wheezing[  ];  hemoptysis[  ]; shortness of breath[  ]; paroxysmal nocturnal dyspnea[  ]; dyspnea on exertion[  ]; or orthopnea[  ];  GI:  gallstones[  ], vomiting[  ];  dysphagia[  ]; melena[  ];  hematochezia [  ]; heartburn[  ];   Hx of  Colonoscopy[  ]; GU: kidney stones [  ]; hematuria[  ];   dysuria [  ];  nocturia[  ];  history of     obstruction [  ]; urinary frequency [  ]             Skin: rash, swelling[  ];, hair loss[  ];  peripheral edema[  ];  or itching[  ]; Musculosketetal: myalgias[  ];  joint swelling[  ];  joint erythema[  ];  joint pain[  ];  back pain[  ];  Heme/Lymph: bruising[  ];  bleeding[  ];  anemia[  ];  Neuro: TIA[  ];  headaches[  ];  stroke[  ];  vertigo[  ];  seizures[  ];   paresthesias[  ];  difficulty walking[n  ]; with the medication for atrial fibrillation the patient had significant problems with tremor to the point he was unable to use hand tools or power this has improved lately  Psych:depression[  ]; anxiety[  ];  Endocrine: diabetes[  ];  thyroid dysfunction[  ];  Immunizations: Flu up to date [  ]; Pneumococcal up to date [  ];  Other:  Physical Exam: BP 152/85 mmHg  Pulse 63  Resp 18  Ht 6\' 2"  (1.88 m)  Wt 242 lb (109.77 kg)  BMI 31.06 kg/m2  SpO2 98%  PHYSICAL EXAMINATION: General appearance: alert, cooperative, appears stated age and no distress Head: Normocephalic, without obvious abnormality, atraumatic Neck: no adenopathy, no carotid bruit, no JVD, supple, symmetrical, trachea midline and  thyroid not enlarged, symmetric, no tenderness/mass/nodules Lymph nodes: Cervical, supraclavicular, and axillary nodes normal. Resp: clear to auscultation bilaterally Back: symmetric, no curvature. ROM normal. No CVA tenderness. Cardio: regular rate and rhythm, S1, S2 normal, no murmur, click, rub or gallop GI: soft, non-tender; bowel sounds normal; no masses,  no organomegaly Extremities: extremities normal, atraumatic, no cyanosis or edema and Homans sign is negative, no sign of DVT Neurologic: Grossly normal The tremor in the hands the patient previously had is now almost completely resolved per his report  Diagnostic Studies & Laboratory data:     Recent Radiology Findings: Ct Angio Chest W/cm &/or Wo Cm  02/13/2016  CLINICAL DATA:  Followup thoracic aortic aneurysm. Chest pain and shortness of breath. EXAM: CT ANGIOGRAPHY CHEST WITH CONTRAST TECHNIQUE: Multidetector CT  imaging of the chest was performed using the standard protocol during bolus administration of intravenous contrast. Multiplanar CT image reconstructions and MIPs were obtained to evaluate the vascular anatomy. CONTRAST:  75 cc Isovue 370 COMPARISON:  07/28/2015 FINDINGS: Mediastinum/Nodes: No chest wall mass, supraclavicular or axillary lymphadenopathy. Small scattered lymph nodes. The thyroid gland is grossly normal. Mild stable cardiac enlargement. No pericardial effusion. Stable fusiform aneurysm on the attachment of the ascending aorta with maximal measurement of 48 x 47 mm. No dissection. The pulmonary arteries appear normal. No mediastinal or hilar mass or adenopathy. Stable small scattered lymph nodes. Esophagus is grossly normal. Lungs/Pleura: No acute pulmonary findings. Dependent subpleural atelectasis is noted. No worrisome pulmonary lesions. No bronchiectasis or interstitial lung disease. Upper abdomen: No significant findings. Musculoskeletal: No significant findings. Moderate stable degenerative changes involving the  thoracic spine. Review of the MIP images confirms the above findings. IMPRESSION: 1. Stable fusiform aneurysmal dilatation of the ascending aorta with maximal measurement of 48 x 47 mm. No dissection. Ascending thoracic aortic aneurysm. Recommend semi-annual imaging followup by CTA or MRA and referral to cardiothoracic surgery if not already obtained. This recommendation follows 2010 ACCF/AHA/AATS/ACR/ASA/SCA/SCAI/SIR/STS/SVM Guidelines for the Diagnosis and Management of Patients With Thoracic Aortic Disease. Circulation. 2010; 121: [id number] 2. Stable mild cardiac enlargement. 3. No acute or significant pulmonary findings. Electronically Signed   By: Marijo Sanes M.D.   On: 02/13/2016 10:53       CLINICAL DATA: Acute left-sided chest and back pain. History of aortic aneurysm.  EXAM: CT ANGIOGRAPHY CHEST, ABDOMEN AND PELVIS  TECHNIQUE: Multidetector CT imaging through the chest, abdomen and pelvis was performed using the standard protocol during bolus administration of intravenous contrast. Multiplanar reconstructed images and MIPs were obtained and reviewed to evaluate the vascular anatomy.  CONTRAST: 150mL OMNIPAQUE IOHEXOL 350 MG/ML SOLN  COMPARISON: CT scan of June 25, 2013.  FINDINGS: CTA CHEST FINDINGS  No pneumothorax or significant pleural effusion is noted. No acute pulmonary disease is noted. There is no evidence of thoracic aortic dissection. Great vessels are widely patent. 4.8 cm ascending thoracic aortic aneurysm is noted. No mediastinal mass or adenopathy is noted. Pulmonary arteries appear normal. No significant osseous abnormality is noted in the chest.  Review of the MIP images confirms the above findings.  CTA ABDOMEN AND PELVIS FINDINGS  No gallstones are noted. No definite abnormality seen involving the liver, spleen or pancreas. Adrenal glands and kidneys appear normal. No hydronephrosis or renal obstruction is noted. No renal or ureteral  calculi are noted. The appendix appears normal. There is no evidence of bowel obstruction. Atherosclerosis of abdominal aorta is noted without dissection. The celiac, superior mesenteric and renal arteries are widely patent. 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 3.1 cm infrarenal abdominal aortic aneurysm is noted. Iliac arteries are widely patent without significant stenosis. Moderate prostatic enlargement is noted. Urinary bladder appears normal. 4.1 cm pseudo aneurysm with a large amount of mural thrombus is seen arising from a proximal branch of the proximal left superficial femoral artery. Multilevel degenerative disc disease is noted in the lumbar spine.  Review of the MIP images confirms the above findings.  IMPRESSION: No evidence of thoracic aortic dissection.  4.8 cm ascending thoracic aortic aneurysm is noted. Ascending thoracic aortic aneurysm. Recommend semi-annual imaging followup by CTA or MRA and referral to cardiothoracic surgery if not already obtained. This recommendation follows 2010 ACCF/AHA/AATS/ACR/ASA/SCA/SCAI/SIR/STS/SVM Guidelines for the Diagnosis and Management of Patients With Thoracic Aortic Disease. Circulation. 2010; 121:  [id number].  No evidence of abdominal aortic dissection. 3.1 cm infrarenal abdominal aortic aneurysm is noted.  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 There appears to be early filling of the left common femoral vein with contrast, most likely due to this pseudoaneurysm. Consultation with vascular surgery is recommended. Critical Value/emergent results were called by telephone at the time of interpretation on 07/28/2015 at 6:11 pm to Dr. Lisa Roca , who verbally acknowledged these results.   Electronically Signed  By: Marijo Conception, M.D.  On: 07/28/2015 18:13  I have independently reviewed the above radiology studies  and  reviewed the findings with the patient.  ECHO: 08/15/2015 Study Conclusions  - Left ventricle: The cavity size was normal. There was mild concentric hypertrophy. Systolic function was normal. The estimated ejection fraction was in the range of 60% to 65%. Wall motion was normal; there were no regional wall motion abnormalities. Left ventricular diastolic function parameters were normal. - Aortic valve: There was trivial regurgitation. - Mitral valve: There was mild regurgitation. - Left atrium: The atrium was normal in size. - Right ventricle: Systolic function was normal. - Pulmonary arteries: Systolic pressure was within the normal range.  Impressions:  - Normal study.   Recent Lab Findings: Lab Results  Component Value Date   WBC 5.8 07/28/2015   HGB 15.8 07/28/2015   HCT 47.2 07/28/2015   PLT 148* 07/28/2015   GLUCOSE 100* 02/11/2016   CHOL 145 09/23/2015   TRIG 130.0 09/23/2015   HDL 44.90 09/23/2015   LDLCALC 74 09/23/2015   ALT 26 06/25/2013   AST 18 06/25/2013   NA 141 02/11/2016   K 3.9 02/11/2016   CL 103 02/11/2016   CREATININE 1.23 02/11/2016   BUN 15 02/11/2016   CO2 24 02/11/2016   TSH 1.38 12/20/2015   INR 2.8 02/12/2016   Aortic Size Index=    4.7     /Body surface area is 2.39 meters squared. =1.97 < 2.75 cm/m2      4% risk per year 2.75 to 4.25          8% risk per year > 4.25 cm/m2    20% risk per year     Assessment / Plan:   Incidental finding of dilated ascending aorta without evidence of aortic insufficiency or bicuspid valve, I reviewed with patient the risks signs and symptoms of aortic dissection. With the current size surgical intervention is not indicated but close follow-up is needed, good control of blood pressure is recommended.   I discussed with the patient not engage in excessive lifting such as competitive weightlifting, Related to him It's best to avoid activities that cause grunting or straining (medically  referred to as a "valsalva maneuver"). T  According to the 2010 ACC/AHA guidelines, we recommend patients with thoracic aortic disease to maintain a LDL of less than 70 and a HDL of greater than 50. We recommend their blood pressure to remain less than 135/85. The patient was educated to take lifelong prophylactic antibiotics prior to any elective invasive procedure.  We'll plan to see the patient back for follow-up CTA of chest  scan in 8 months      Grace Isaac MD      [address] [place] [address] Office [phone]   Beeper [phone]  02/13/2016 1:06 PM

## 2016-02-17 NOTE — Progress Notes (Signed)
Pharmacy Review for Dofetilide (Tikosyn) Initiation  Admit Complaint: 70 y.o. male admitted 02/17/2016 with atrial fibrillation to be initiated on dofetilide.   Assessment:  Patient Exclusion Criteria: If any screening criteria checked as "Yes", then  patient  should NOT receive dofetilide until criteria item is corrected. If "Yes" please indicate correction plan.  YES  NO Patient  Exclusion Criteria Correction Plan  [x]  []  Baseline QTc interval is greater than or equal to 440 msec. IF above YES box checked dofetilide contraindicated unless patient has ICD; then may proceed if QTc 500-550 msec or with known ventricular conduction abnormalities may proceed with QTc 550-600 msec. QTc = 0.42 MD aware; f/u EKG after initiation   []  [x]  Magnesium level is less than 1.8 mEq/l : Last magnesium:  Lab Results  Component Value Date   MG 1.9 02/17/2016         []  [x]  Potassium level is less than 4 mEq/l : Last potassium:  Lab Results  Component Value Date   K 4.5 02/17/2016         []  [x]  Patient is known or suspected to have a digoxin level greater than 2 ng/ml: No results found for: DIGOXIN    []  [x]  Creatinine clearance less than 20 ml/min (calculated using Cockcroft-Gault, actual body weight and serum creatinine): Estimated Creatinine Clearance: 88.2 mL/min (by C-G formula based on Cr of 1.04).    []  [x]  Patient has received drugs known to prolong the QT intervals within the last 48 hours (phenothiazines, tricyclics or tetracyclic antidepressants, erythromycin, H-1 antihistamines, cisapride, fluoroquinolones, azithromycin). Drugs not listed above may have an, as yet, undetected potential to prolong the QT interval, updated information on QT prolonging agents is available at this website:QT prolonging agents   []  [x]  Patient received a dose of hydrochlorothiazide (Oretic) alone or in any combination including triamterene (Dyazide, Maxzide) in the last 48 hours.   []  [x]  Patient received a  medication known to increase dofetilide plasma concentrations prior to initial dofetilide dose:  . Trimethoprim (Primsol, Proloprim) in the last 36 hours . Verapamil (Calan, Verelan) in the last 36 hours or a sustained release dose in the last 72 hours . Megestrol (Megace) in the last 5 days  . Cimetidine (Tagamet) in the last 6 hours . Ketoconazole (Nizoral) in the last 24 hours . Itraconazole (Sporanox) in the last 48 hours  . Prochlorperazine (Compazine) in the last 36 hours    []  [x]  Patient is known to have a history of torsades de pointes; congenital or acquired long QT syndromes.   []  [x]  Patient has received a Class 1 antiarrhythmic with less than 2 half-lives since last dose. (Disopyramide, Quinidine, Procainamide, Lidocaine, Mexiletine, Flecainide, Propafenone)   []  [x]  Patient has received amiodarone therapy in the past 3 months or amiodarone level is greater than 0.3 ng/ml.     Ordering provider was confirmed at [email or web address] if they are not listed on the [place] Prescribers list.  Goal of Therapy: Follow renal function, electrolytes, potential drug interactions, and dose adjustment. Provide education and 1 week supply at discharge.  Plan:  [x]   Physician selected initial dose within range recommended for patients level of renal function - will monitor for response.  []   Physician selected initial dose outside of range recommended for patients level of renal function - will discuss if the dose should be altered at this time.   Select One Calculated CrCl  Dose q12h  [x]  > 60 ml/min 500 mcg  []  40-60 ml/min  250 mcg  []  20-40 ml/min 125 mcg   2. Follow up QTc after the first 5 doses, renal function, electrolytes (K & Mg) daily x 3  days, dose adjustment, success of initiation and facilitate 1 week discharge supply as clinically indicated.  3. Initiate Tikosyn education video (Call [phone] and ask for video # 116).  4. Place Enrollment Form on the chart for  discharge supply of dofetilide.   SANDY TORIVIO 9:29 PM 02/17/2016

## 2016-02-18 ENCOUNTER — Encounter (HOSPITAL_COMMUNITY): Payer: Self-pay | Admitting: Cardiovascular Disease

## 2016-02-18 LAB — CBC WITH DIFFERENTIAL/PLATELET
Basophils Absolute: 0 10*3/uL (ref 0.0–0.1)
Basophils Relative: 0 %
EOS ABS: 0.1 10*3/uL (ref 0.0–0.7)
Eosinophils Relative: 2 %
HEMATOCRIT: 45.9 % (ref 39.0–52.0)
HEMOGLOBIN: 14.9 g/dL (ref 13.0–17.0)
LYMPHS ABS: 1.4 10*3/uL (ref 0.7–4.0)
LYMPHS PCT: 28 %
MCH: 30 pg (ref 26.0–34.0)
MCHC: 32.5 g/dL (ref 30.0–36.0)
MCV: 92.5 fL (ref 78.0–100.0)
MONOS PCT: 13 %
Monocytes Absolute: 0.7 10*3/uL (ref 0.1–1.0)
NEUTROS PCT: 57 %
Neutro Abs: 3 10*3/uL (ref 1.7–7.7)
Platelets: 128 10*3/uL — ABNORMAL LOW (ref 150–400)
RBC: 4.96 MIL/uL (ref 4.22–5.81)
RDW: 13.3 % (ref 11.5–15.5)
WBC: 5.2 10*3/uL (ref 4.0–10.5)

## 2016-02-18 LAB — BASIC METABOLIC PANEL
Anion gap: 8 (ref 5–15)
BUN: 8 mg/dL (ref 6–20)
CHLORIDE: 103 mmol/L (ref 101–111)
CO2: 27 mmol/L (ref 22–32)
CREATININE: 1.14 mg/dL (ref 0.61–1.24)
Calcium: 8.6 mg/dL — ABNORMAL LOW (ref 8.9–10.3)
GFR calc non Af Amer: >60 mL/min (ref >60)
Glucose, Bld: 95 mg/dL (ref 65–99)
Potassium: 4.2 mmol/L (ref 3.5–5.1)
Sodium: 138 mmol/L (ref 135–145)

## 2016-02-18 LAB — MAGNESIUM: Magnesium: 2.2 mg/dL (ref 1.7–2.4)

## 2016-02-18 LAB — PROTIME-INR
INR: 2.68 — ABNORMAL HIGH (ref 0.00–1.49)
PROTHROMBIN TIME: 28.1 s — AB (ref 11.6–15.2)

## 2016-02-18 MED ORDER — WARFARIN SODIUM 5 MG PO TABS
5.0000 mg | ORAL_TABLET | Freq: Once | ORAL | Status: AC
  Administered 2016-02-18: 5 mg via ORAL
  Filled 2016-02-18: qty 1

## 2016-02-18 MED ORDER — DOFETILIDE 250 MCG PO CAPS
250.0000 ug | ORAL_CAPSULE | Freq: Two times a day (BID) | ORAL | Status: DC
  Administered 2016-02-18: 250 ug via ORAL
  Filled 2016-02-18: qty 1

## 2016-02-18 NOTE — Progress Notes (Signed)
Tikosyn benefits check completed for coverage  S/W LEO @ [employer]  # [phone]   TIKOSYN 250 MCG BID 30 DAY   COVER- NOT ON FORMULARY  CO-PAY 50 % OF COAST  PRIOR APPROVAL - YES # [phone]   DOFETILIDE :  CCOVER-YES  DO-PAY- $ 6.00 IN NETWORK  / OUT OF NETWORK $20.00  PHARMACY : [facility], [facility] , [facility] AND [facility]

## 2016-02-18 NOTE — Progress Notes (Signed)
SUBJECTIVE: The patient is doing well today.  At this time, he denies chest pain, shortness of breath, or any new concerns.  Marland Kitchen atorvastatin  10 mg Oral Daily  . dofetilide  250 mcg Oral BID  . lactase  6,000 Units Oral QPC breakfast  . loratadine  10 mg Oral Daily  . pantoprazole  40 mg Oral Daily  . potassium chloride  40 mEq Oral Daily  . sodium chloride flush  3 mL Intravenous Q12H  . tamsulosin  0.4 mg Oral Daily  . Warfarin - Pharmacist Dosing Inpatient   Does not apply q1800      OBJECTIVE: Physical Exam: Filed Vitals:   02/17/16 1511 02/17/16 2124 02/18/16 0110 02/18/16 0436  BP: 145/86 146/81 132/74 128/79  Pulse: 58 66  62  Temp: 98 F (36.7 C) 98.4 F (36.9 C)  98.2 F (36.8 C)  TempSrc: Oral Oral  Oral  Resp: 18 18 18 18   Height: 6\' 2"  (1.88 m)     Weight: 240 lb 8 oz (109.09 kg)     SpO2: 98% 98% 98% 99%    Intake/Output Summary (Last 24 hours) at 02/18/16 0836 Last data filed at 02/17/16 1655  Gross per 24 hour  Intake    993 ml  Output      0 ml  Net    993 ml    Telemetry reveals sinus rhythm, PVC's, infrequent couplets, occassional V. bigemeny  GEN- The patient is well appearing, alert and oriented x 3 today.   Head- normocephalic, atraumatic Eyes-  Sclera clear, conjunctiva pink Ears- hearing intact Oropharynx- clear Neck- supple, no JVP Lungs- Clear to ausculation bilaterally, normal work of breathing Heart- Regular rate and rhythm, no significant murmurs, no rubs or gallops GI- soft, NT, ND Extremities- no clubbing, cyanosis, or edema Skin- no rash or lesion Psych- euthymic mood, full affect Neuro- no gross deficits appreciated  LABS: Basic Metabolic Panel:  Recent Labs  02/17/16 1405  02/17/16 2045 02/18/16 0315  NA 140  --   --  138  K 3.6  < > 4.5 4.2  CL 104  --   --  103  CO2 26  --   --  27  GLUCOSE 99  --   --  95  BUN 9  --   --  8  CREATININE 1.04  --   --  1.14  CALCIUM 8.5*  --   --  8.6*  MG 1.9  --   --  2.2   < > = values in this interval not displayed. CBC:  Recent Labs  02/18/16 0315  WBC 5.2  NEUTROABS 3.0  HGB 14.9  HCT 45.9  MCV 92.5  PLT 128*       ASSESSMENT AND PLAN:   1. Persitent Afib, Tikosyn load     Remains in SR, PVC's known to his history     K+ 4.2     Mag 2.2     Creat 1.14 (calc Cr. Cl is 94)     QTc is prolonged, Tikosyn has been down-titrated to 224mcg BID     On Warfarin,  INR is pending, pharmacy is managing   Tommye Standard, PA-C 02/18/2016 8:36 AM   I have seen and examined this patient with Tommye Standard.  Agree with above, note added to reflect my findings.  On exam, regular rhythm, no murmurs, lungs clear.  Admit for tikosyn load.  QTc increased, Avonelle Viveros decrease to 250 mcg BID and continue  to monitor.    Enrique Manganaro M. Sumner Boesch MD 02/18/2016 3:38 PM

## 2016-02-18 NOTE — Progress Notes (Signed)
Utilization review completed.  

## 2016-02-18 NOTE — Progress Notes (Signed)
Pt.has frequent PVCs (bigeminy),HR at 60s/min.EKG 3 hours after initial loading dose of tikosyn done,QTC is 513.Pt is asymptomatic,BP is 132/74.Dr Billee Cashing Clean cardiology fellow on call made aware with order to hold next dose of tikosyn until concern address in AM rounds.Will continue to monitor.

## 2016-02-18 NOTE — Progress Notes (Signed)
[place] for warfarin Indication: atrial fibrillation  Allergies  Allergen Reactions  . Lactose Intolerance (Gi)     Patient Measurements: Height: 6\' 2"  (188 cm) Weight: 240 lb 8 oz (109.09 kg) IBW/kg (Calculated) : 82.2  Vital Signs: Temp: 98.2 F (36.8 C) (04/04 0436) Temp Source: Oral (04/04 0436) BP: 128/79 mmHg (04/04 0436) Pulse Rate: 62 (04/04 0436)  Labs:  Recent Labs  02/17/16 1405 02/18/16 0315 02/18/16 1009  HGB  --  14.9  --   HCT  --  45.9  --   PLT  --  128*  --   LABPROT 33.6*  --  28.1*  INR 3.40*  --  2.68*  CREATININE 1.04 1.14  --     Estimated Creatinine Clearance: 80.4 mL/min (by C-G formula based on Cr of 1.14).   Medical History: Past Medical History  Diagnosis Date  . A-fib ([place])   . Bowel obstruction ([place])   . Hearing impaired   . Diverticulitis   . Hypertension   . Thoracic aortic aneurysm without rupture ([place])   . Sleep apnea   . AAA (abdominal aortic aneurysm) ([place])   . Essential hypertension   . Hyperlipidemia   . Persistent atrial fibrillation [facility])     Status post ablation in July 2016 in [place].   Assessment: 24 yoM with PMHx of afib admitted for TEE and here for Tikosyn initiation. INR 3.4 on admit (4/3) -INR today= 2.68  PTA dose:  5 mg daily except 7.5 mg on Mon, Wed, Fri and Sat  Goal of Therapy:  INR 2-3 Monitor platelets by anticoagulation protocol: Yes   Plan:  -Coumadin 5mg  po today -Daily PT/INR  Hildred Laser, Pharm D 02/18/2016 1:39 PM

## 2016-02-19 LAB — BASIC METABOLIC PANEL
ANION GAP: 13 (ref 5–15)
BUN: 10 mg/dL (ref 6–20)
CO2: 24 mmol/L (ref 22–32)
Calcium: 9.1 mg/dL (ref 8.9–10.3)
Chloride: 105 mmol/L (ref 101–111)
Creatinine, Ser: 1.19 mg/dL (ref 0.61–1.24)
GFR calc Af Amer: >60 mL/min (ref >60)
GLUCOSE: 98 mg/dL (ref 65–99)
POTASSIUM: 4.1 mmol/L (ref 3.5–5.1)
Sodium: 142 mmol/L (ref 135–145)

## 2016-02-19 LAB — PROTIME-INR
INR: 2.32 — ABNORMAL HIGH (ref 0.00–1.49)
Prothrombin Time: 25.2 seconds — ABNORMAL HIGH (ref 11.6–15.2)

## 2016-02-19 LAB — MAGNESIUM: Magnesium: 2.3 mg/dL (ref 1.7–2.4)

## 2016-02-19 MED ORDER — FUROSEMIDE 10 MG/ML IJ SOLN
INTRAMUSCULAR | Status: AC
  Administered 2016-02-19: 10 mg
  Filled 2016-02-19: qty 8

## 2016-02-19 MED ORDER — DOFETILIDE 250 MCG PO CAPS
250.0000 ug | ORAL_CAPSULE | Freq: Two times a day (BID) | ORAL | Status: DC
  Administered 2016-02-19 – 2016-02-20 (×2): 250 ug via ORAL
  Filled 2016-02-19 (×2): qty 1

## 2016-02-19 MED ORDER — WARFARIN SODIUM 7.5 MG PO TABS
7.5000 mg | ORAL_TABLET | Freq: Once | ORAL | Status: AC
  Administered 2016-02-19: 7.5 mg via ORAL
  Filled 2016-02-19: qty 1

## 2016-02-19 NOTE — Care Management Note (Signed)
Case Management Note Marvetta Gibbons RN, BSN Unit 2W-Case Manager [phone]  Patient Details  Name: David Rice MRN: [id number] Date of Birth: [birth year]  Subjective/Objective:    Pt admitted with afib for Tikosyn load                Action/Plan: PTA pt lived at home- independent- referral received for Tikosyn- per insurance check-  S/W LEO @ [facility]  # [phone]   TIKOSYN 250 MCG BID 30 DAY  COVER- NOT ON FORMULARY  CO-PAY 50 % OF [place] # [phone]   DOFETILIDE :  CCOVER-YES  DO-PAY- $ 6.00 IN NETWORK  / OUT OF NETWORK $20.00  [facility]   Above info given to pt- per pt he uses [facility] in [place]- call made to Pharmacy and they can order drug in stock when script provided- call also made to [facility] in [place]- both of these pharmacies can also order drug in stock. - if pt is discharged on Tikosyn- pt will need a 7 day supply from [place] on discharge- MD please provide script for 7 day supply to send to [facility]- pt will also need script with refills to take to his pharmacy of choice.   Expected Discharge Date:                  Expected Discharge Plan:  Home/Self Care  In-House Referral:     Discharge planning Services  CM Consult, Medication Assistance  Post Acute Care Choice:    Choice offered to:     DME Arranged:    DME Agency:     HH Arranged:    HH Agency:     Status of Service:  In process, will continue to follow  Medicare Important Message Given:    Date Medicare IM Given:    Medicare IM give by:    Date Additional Medicare IM Given:    Additional Medicare Important Message give by:     If discussed at [place] of Stay Meetings, dates discussed:    Additional Comments:  Dawayne Patricia, RN 02/19/2016, 2:59 PM

## 2016-02-19 NOTE — Progress Notes (Signed)
Chaplain stopped by the check AD paperwork. Chaplain will help complete paperwork in the AM. Please page and let us know when you are ready.

## 2016-02-19 NOTE — Progress Notes (Signed)
[facility] for warfarin Indication: atrial fibrillation  Allergies  Allergen Reactions  . Lactose Intolerance (Gi)     Patient Measurements: Height: 6\' 2"  (188 cm) Weight: 240 lb 8 oz (109.09 kg) IBW/kg (Calculated) : 82.2  Vital Signs: Temp Source: Oral (04/05 0405) BP: 128/82 mmHg (04/05 0405) Pulse Rate: 69 (04/05 0405)  Labs:  Recent Labs  02/17/16 1405 02/18/16 0315 02/18/16 1009 02/19/16 0418  HGB  --  14.9  --   --   HCT  --  45.9  --   --   PLT  --  128*  --   --   LABPROT 33.6*  --  28.1* 25.2*  INR 3.40*  --  2.68* 2.32*  CREATININE 1.04 1.14  --  1.19    Estimated Creatinine Clearance: 77.1 mL/min (by C-G formula based on Cr of 1.19).   Medical History: Past Medical History  Diagnosis Date  . Bowel obstruction ([place])   . Hearing impaired   . Diverticulitis   . Hypertension   . Essential hypertension   . Hyperlipidemia   . Persistent atrial fibrillation [facility])     Status post ablation in July 2016 in [place].  . Thoracic aortic aneurysm without rupture ([place])   . AAA (abdominal aortic aneurysm) ([place])   . Aorta aneurysm ([place])     "@ the valve; 4.5cm; [name] is watching it" (02/18/2016)  . OSA on CPAP   . GERD (gastroesophageal reflux disease)   . Arthritis     "spine, shoulders, legs" (02/18/2016)  . Chronic lower back pain   . On home oxygen therapy     "don't know how much; use it at night" (02/18/2016)   Assessment: 61 yoM with PMHx of afib admitted for TEE and here for Tikosyn initiation. INR 3.4 on admit (4/3) -INR today= 2.32 -noted with afib on 4/4 and per cards, may need to change to amio  PTA dose:  5 mg daily except 7.5 mg on Mon, Wed, Fri and Sat  Goal of Therapy:  INR 2-3 Monitor platelets by anticoagulation protocol: Yes   Plan:  -Coumadin 7.5mg  po today -Will watch plans for amiodarone  -Daily PT/INR  Hildred Laser, Pharm D 02/19/2016 10:38 AM

## 2016-02-19 NOTE — Progress Notes (Signed)
SUBJECTIVE: The patient is doing well today.  At this time, he denies chest pain, shortness of breath, or any new concerns.  Marland Kitchen atorvastatin  10 mg Oral Daily  . dofetilide  250 mcg Oral BID  . lactase  6,000 Units Oral QPC breakfast  . loratadine  10 mg Oral Daily  . pantoprazole  40 mg Oral Daily  . potassium chloride  40 mEq Oral Daily  . sodium chloride flush  3 mL Intravenous Q12H  . tamsulosin  0.4 mg Oral Daily  . Warfarin - Pharmacist Dosing Inpatient   Does not apply q1800      OBJECTIVE: Physical Exam: Filed Vitals:   02/18/16 0436 02/18/16 1500 02/18/16 2018 02/19/16 0405  BP: 128/79 152/83 141/87 128/82  Pulse: 62 65 65 69  Temp: 98.2 F (36.8 C) 98.3 F (36.8 C) 98 F (36.7 C)   TempSrc: Oral Oral Oral Oral  Resp: 18  18 18   Height:      Weight:      SpO2: 99% 100% 98% 98%    Intake/Output Summary (Last 24 hours) at 02/19/16 0641 Last data filed at 02/18/16 1235  Gross per 24 hour  Intake    480 ml  Output      0 ml  Net    480 ml    Telemetry reveals SR this morning, he had some PAFib yesterday, WCT beats with his AF look irregular and are felt to be abarrency, not ventricular  GEN- The patient is well appearing, alert and oriented x 3 today.   Head- normocephalic, atraumatic Eyes-  Sclera clear, conjunctiva pink Ears- hearing intact Oropharynx- clear Neck- supple, no JVP Lungs- Clear to ausculation bilaterally, normal work of breathing Heart- Regular rate and rhythm, no significant murmurs, no rubs or gallops GI- soft, NT, ND Extremities- no clubbing, cyanosis, or edema Skin- no rash or lesion Psych- euthymic mood, full affect Neuro- no gross deficits appreciated  LABS: Basic Metabolic Panel:  Recent Labs  02/18/16 0315 02/19/16 0418  NA 138 142  K 4.2 4.1  CL 103 105  CO2 27 24  GLUCOSE 95 98  BUN 8 10  CREATININE 1.14 1.19  CALCIUM 8.6* 9.1  MG 2.2 2.3   CBC:  Recent Labs  02/18/16 0315  WBC 5.2  NEUTROABS 3.0  HGB  14.9  HCT 45.9  MCV 92.5  PLT 128*       ASSESSMENT AND PLAN:   1. Persitent Afib, Tikosyn load     Remains in SR, PVC's known to his history, PAF yesterday     Tikosyn down-titrated yesterday to 246mcg BID secondary to QT prolongation     K+ 4.1     Mag 2.3     Creat 1.19 (calc Cr. Cl is 90.21)     QTc is reviewed with Dr. Curt Bears, stable to continue current dose     On Warfarin,  INR is 2.32, pharmacy is managing  2. OSA     Was unable to get his home machine in, Genny Caulder order for here   Tommye Standard, PA-C 02/19/2016 6:41 AM  I have seen and examined this patient with Tommye Standard.  Agree with above, note added to reflect my findings.  On exam, regular rhythm, no murmurs, lungs clear.  Has been on tikosyn with a run of AF yesterday.  Katti Pelle plan for further tikosyn loading and if he has AF today, Melysa Schroyer plan for possible drug switch to amiodarone.  QTc stable.  Jalaya Sarver M. Rielle Schlauch MD 02/19/2016 9:54 AM

## 2016-02-19 NOTE — Progress Notes (Addendum)
Was told that we were to hold Tikosyn due to QTC being over 500 in shift report. No new notes placed or parameters with med in mar.Spoke with PA she wanted med to be given, but to late for medication to be given so it will resume tonight. CN- Erasmo Downer made aware. Shirley Muscat 3:19 PM

## 2016-02-20 LAB — BASIC METABOLIC PANEL
Anion gap: 10 (ref 5–15)
BUN: 12 mg/dL (ref 6–20)
CALCIUM: 9.1 mg/dL (ref 8.9–10.3)
CHLORIDE: 105 mmol/L (ref 101–111)
CO2: 26 mmol/L (ref 22–32)
CREATININE: 1.2 mg/dL (ref 0.61–1.24)
GFR calc non Af Amer: 60 mL/min — ABNORMAL LOW (ref >60)
GLUCOSE: 100 mg/dL — AB (ref 65–99)
Potassium: 4.2 mmol/L (ref 3.5–5.1)
SODIUM: 141 mmol/L (ref 135–145)

## 2016-02-20 LAB — PROTIME-INR
INR: 2.06 — AB (ref 0.00–1.49)
Prothrombin Time: 23.1 seconds — ABNORMAL HIGH (ref 11.6–15.2)

## 2016-02-20 LAB — MAGNESIUM: MAGNESIUM: 2.3 mg/dL (ref 1.7–2.4)

## 2016-02-20 MED ORDER — DOFETILIDE 250 MCG PO CAPS: 250.0000 ug | ORAL_CAPSULE | Freq: Two times a day (BID) | ORAL | Status: DC

## 2016-02-20 NOTE — Care Management Important Message (Signed)
Important Message  Patient Details  Name: David Rice MRN: [id number] Date of Birth: Nov 21, 1945   Medicare Important Message Given:  Yes    Nathen May 02/20/2016, 11:08 AM

## 2016-02-20 NOTE — Care Management Note (Signed)
Case Management Note Marvetta Gibbons RN, BSN Unit 2W-Case Manager [phone]  Patient Details  Name: GRANDON TORSIELLO MRN: [id number] Date of Birth: 09-23-1946  Subjective/Objective:    Pt admitted with afib for Tikosyn load                Action/Plan: PTA pt lived at home- independent- referral received for Tikosyn- per insurance check-  S/W LEO @ [facility]  # [phone]   TIKOSYN 250 MCG BID 30 DAY  COVER- NOT ON FORMULARY  CO-PAY 50 % OF [place] # [phone]   DOFETILIDE :  CCOVER-YES  DO-PAY- $ 6.00 IN NETWORK  / OUT OF NETWORK $20.00  [facility]   Above info given to pt- per pt he uses [facility] in [place]- call made to Pharmacy and they can order drug in stock when script provided- call also made to [facility] in [place]- both of these pharmacies can also order drug in stock. - if pt is discharged on Tikosyn- pt will need a 7 day supply from [place] on discharge- MD please provide script for 7 day supply to send to [facility]- pt will also need script with refills to take to his pharmacy of choice.   Expected Discharge Date:     02/20/16             Expected Discharge Plan:  Home/Self Care  In-House Referral:     Discharge planning Services  CM Consult, Medication Assistance  Post Acute Care Choice:    Choice offered to:     DME Arranged:    DME Agency:     HH Arranged:    HH Agency:     Status of Service:  Completed, signed off  Medicare Important Message Given:  Yes Date Medicare IM Given:    Medicare IM give by:    Date Additional Medicare IM Given:    Additional Medicare Important Message give by:     If discussed at [place] of Stay Meetings, dates discussed:    Additional Comments:  Dawayne Patricia, RN 02/20/2016, 12:45 PM

## 2016-02-20 NOTE — Progress Notes (Signed)
Reviewed d/c information with pt. Pt has no further questions.

## 2016-02-20 NOTE — Discharge Summary (Signed)
ELECTROPHYSIOLOGY PROCEDURE DISCHARGE SUMMARY    Patient ID: David Rice,  MRN: [id number], DOB/AGE: [birth year] 70 y.o.  Admit date: 02/17/2016 Discharge date: 02/20/2016  Primary Care Physician: Crecencio Mc, MD Primary Cardiologist: Fletcher Anon Electrophysiologist: Caryl Comes  Primary Discharge Diagnosis:  1.  Persistent atrial fibrillation status post Tikosyn loading this admission  Secondary Discharge Diagnosis:  1.  Hypertension 2.  OSA on CPAP 3.  Hyperlipidemia  Allergies  Allergen Reactions  . Lactose Intolerance (Gi)      Procedures This Admission:  1.  Tikosyn loading  Brief HPI: David Rice is a 70 y.o. male with a past medical history as noted above.  They were referred to EP in the outpatient setting for treatment options of atrial fibrillation.  Risks, benefits, and alternatives to Tikosyn were reviewed with the patient who wished to proceed.    Hospital Course:  The patient was admitted and Tikosyn was initiated.  Renal function and electrolytes were followed during the hospitalization.  Their QTc initially prolonged but remained stable on decreased dose of Tikosyn. They were monitored until discharge on telemetry which demonstrated sinus rhythm with monomorphic PVC's.  On the day of discharge, they were examined by Dr Curt Bears who considered them stable for discharge to home.  Follow-up has been arranged with [facility] in 1 week and with Dr Caryl Comes in 4 weeks.   This patients CHA2DS2-VASc Score and unadjusted Ischemic Stroke Rate (% per year) is equal to 2.2 % stroke rate/year from a score of 2 Above score calculated as 1 point each if present [CHF, HTN, DM, Vascular=MI/PAD/Aortic Plaque, Age if 65-74, or Male] Above score calculated as 2 points each if present [Age > 75, or Stroke/TIA/TE]   Physical Exam: Filed Vitals:   02/19/16 1431 02/19/16 2212 02/19/16 2215 02/20/16 0549  BP: 148/95 140/94  126/65  Pulse: 69 62  59  Temp:  98.2 F (36.8 C)   97.6 F (36.4 C)  TempSrc:  Oral  Oral  Resp: 18 18 16 18   Height:      Weight:      SpO2: 98% 98%  97%    GEN- The patient is well appearing, alert and oriented x 3 today.   HEENT: normocephalic, atraumatic; sclera clear, conjunctiva pink; hearing intact; oropharynx clear; neck supple  Lungs- Clear to ausculation bilaterally, normal work of breathing.  No wheezes, rales, rhonchi Heart- Regular rate and rhythm  GI- soft, non-tender, non-distended, bowel sounds present  Extremities- no clubbing, cyanosis, or edema; DP/PT/radial pulses 2+ bilaterally MS- no significant deformity or atrophy Skin- warm and dry, no rash or lesion Psych- euthymic mood, full affect Neuro- strength and sensation are intact   Labs:   Lab Results  Component Value Date   WBC 5.2 02/18/2016   HGB 14.9 02/18/2016   HCT 45.9 02/18/2016   MCV 92.5 02/18/2016   PLT 128* 02/18/2016     Recent Labs Lab 02/20/16 0241  NA 141  K 4.2  CL 105  CO2 26  BUN 12  CREATININE 1.20  CALCIUM 9.1  GLUCOSE 100*     Discharge Medications:    Medication List    TAKE these medications        atorvastatin 10 MG tablet  Commonly known as:  LIPITOR  Take 1 tablet (10 mg total) by mouth daily.     dofetilide 250 MCG capsule  Commonly known as:  TIKOSYN  Take 1 capsule (250 mcg total) by mouth 2 (two) times  daily.     esomeprazole 20 MG capsule  Commonly known as:  NEXIUM  Take 20 mg by mouth daily at 12 noon.     LACTAID PO  Take by mouth. Take 2 capsules daily.     loratadine 10 MG tablet  Commonly known as:  CLARITIN  Take 10 mg by mouth at bedtime.     mometasone 50 MCG/ACT nasal spray  Commonly known as:  NASONEX  Place 2 sprays into the nose 2 (two) times daily.     silodosin 8 MG Caps capsule  Commonly known as:  RAPAFLO  Take 8 mg by mouth at bedtime.     traMADol 50 MG tablet  Commonly known as:  ULTRAM  Take 1 tablet (50 mg total) by mouth every 6 (six) hours as needed.      warfarin 5 MG tablet  Commonly known as:  COUMADIN  Take as directed by [facility]        Disposition:  Discharge Instructions    Diet - low sodium heart healthy    Complete by:  As directed      Increase activity slowly    Complete by:  As directed           Follow-up Information    Follow up with [facility] On 02/26/2016.   Specialty:  Cardiology   Why:  at 11:20AM for coumadin check, labs and EKG   Contact information:   [address], [place] [phone]      Follow up with Virl Axe, MD On 03/26/2016.   Specialty:  Cardiology   Why:  at 10:30AM    Contact information:   [place] [address] [phone]       Duration of Discharge Encounter: Greater than 30 minutes including physician time.  Signed, Chanetta Marshall, NP 02/20/2016 9:06 AM  I have seen and examined this patient with Chanetta Marshall.  Agree with above, note added to reflect my findings.  On exam, regular rhythm, no murmurs, lungs clear.  In sinus rhythm today tolerating tikosyn well.  No acute complaints.  David Rice plan for discharge with follow up in clinic for labs.    David Rice M. David [facility] MD 02/20/2016 9:53 AM

## 2016-02-21 ENCOUNTER — Encounter: Payer: Self-pay | Admitting: Physical Therapy

## 2016-02-21 ENCOUNTER — Encounter: Payer: Self-pay | Admitting: Neurology

## 2016-02-26 ENCOUNTER — Ambulatory Visit: Payer: Medicare Other | Attending: Neurology | Admitting: Physical Therapy

## 2016-02-26 ENCOUNTER — Ambulatory Visit (INDEPENDENT_AMBULATORY_CARE_PROVIDER_SITE_OTHER): Payer: Medicare Other

## 2016-02-26 ENCOUNTER — Encounter: Payer: Self-pay | Admitting: Physical Therapy

## 2016-02-26 ENCOUNTER — Telehealth: Payer: Self-pay | Admitting: Cardiology

## 2016-02-26 ENCOUNTER — Other Ambulatory Visit: Payer: Self-pay | Admitting: Internal Medicine

## 2016-02-26 VITALS — BP 148/92 | HR 60 | Ht 74.0 in | Wt 243.0 lb

## 2016-02-26 DIAGNOSIS — R262 Difficulty in walking, not elsewhere classified: Secondary | ICD-10-CM

## 2016-02-26 DIAGNOSIS — R2681 Unsteadiness on feet: Secondary | ICD-10-CM | POA: Diagnosis not present

## 2016-02-26 DIAGNOSIS — Z5181 Encounter for therapeutic drug level monitoring: Secondary | ICD-10-CM

## 2016-02-26 DIAGNOSIS — M6281 Muscle weakness (generalized): Secondary | ICD-10-CM | POA: Diagnosis not present

## 2016-02-26 DIAGNOSIS — Z79899 Other long term (current) drug therapy: Secondary | ICD-10-CM

## 2016-02-26 DIAGNOSIS — I4891 Unspecified atrial fibrillation: Secondary | ICD-10-CM

## 2016-02-26 LAB — POCT INR: INR: 2.4

## 2016-02-26 MED ORDER — DOFETILIDE 250 MCG PO CAPS: 250.0000 ug | ORAL_CAPSULE | Freq: Two times a day (BID) | ORAL | Status: DC

## 2016-02-26 NOTE — Progress Notes (Signed)
1.) Reason for visit: EKG after stating Tikosyn  2.) Name of MD requesting visit: Dr. Caryl Comes  3.) H&P: Pt started on tikosyn during recent hospitalization.    4.) ROS related to problem:  Pt reports that he is feeling good, feels that he is on longer in afib.  Concerned that he no longer has stamina that he used to, has "become a couch potato".  EKG shows NSR, HR 60.  BMET & Mag drawn during today's visit.   5.) Assessment and plan per MD: Forwarding to Dr. Caryl Comes for his review.

## 2016-02-26 NOTE — Telephone Encounter (Signed)
°*  STAT* If patient is at the pharmacy, call can be transferred to refill team.   1. Which medications need to be refilled? (please list name of each medication and dose if known) Tikosin   2. Which pharmacy/location (including street and city if local pharmacy) is medication to be sent to? [facility] outpatient pharmacy  3. Do they need a 30 day or 90 day supply? 90 day   We sent it to another pharmacy but patient states he would like [place] to please send it to this one.

## 2016-02-26 NOTE — Telephone Encounter (Addendum)
New message       *STAT* If patient is at the pharmacy, call can be transferred to refill team.   1. Which medications need to be refilled? (please list name of each medication and dose if known) tikosyn 2. Which pharmacy/location (including street and city if local pharmacy) is medication to be sent to? [facility]-----new pharmacy  3. Do they need a 30 day or 90 day supply? Not sure  Pharmacist said pt came by to get presc.  He is hard of hearing.  She thinks presc may have been called in somewhere else but it is hard getting info from pt

## 2016-02-26 NOTE — Telephone Encounter (Signed)
dofetilide (TIKOSYN) 250 MCG capsule  Medication   Date: 02/26/2016  Department: [facility]  Ordering/Authorizing: [name], MD      Order Providers    Prescribing Provider Encounter Provider   [name], MD [name], MD    Medication Detail      Disp Refills Start End     dofetilide [facility]) 250 MCG capsule 180 capsule 3 02/26/2016     Sig - Route: Take 1 capsule (250 mcg total) by mouth 2 (two) times daily. - Oral    E-Prescribing Status: Receipt confirmed by pharmacy (02/26/2016 2:06 PM EDT)     Pharmacy    [facility], [place]

## 2016-02-26 NOTE — Telephone Encounter (Signed)
Refill sent for Tikosyn 250 mg take one tablet twice a day.

## 2016-02-26 NOTE — Therapy (Signed)
[place] MAIN [facility] SERVICES [address] [place], [place], [address] Phone: [phone]   Fax:  [phone]  Physical Therapy Treatment  Patient Details  Name: David Rice MRN: [id number] Date of Birth: [birth year] Referring Provider: Dr. Narda Amber;   Encounter Date: 02/26/2016      PT End of Session - 02/26/16 1312    Visit Number 7   Number of Visits 17   Date for PT Re-Evaluation 03/25/16   Authorization Type Gcode 7   Authorization Time Period 10   PT Start Time 1300   PT Stop Time 1345   PT Time Calculation (min) 45 min   Equipment Utilized During Treatment Gait belt   Activity Tolerance Patient tolerated treatment well   Behavior During Therapy [facility] for tasks assessed/performed      Past Medical History  Diagnosis Date  . Bowel obstruction ([place])   . Hearing impaired   . Diverticulitis   . Hypertension   . Essential hypertension   . Hyperlipidemia   . Persistent atrial fibrillation [facility])     Status post ablation in July 2016 in [place].  . Thoracic aortic aneurysm without rupture ([place])   . AAA (abdominal aortic aneurysm) ([place])   . Aorta aneurysm ([place])     "@ the valve; 4.5cm; [name] is watching it" (02/18/2016)  . OSA on CPAP   . GERD (gastroesophageal reflux disease)   . Arthritis     "spine, shoulders, legs" (02/18/2016)  . Chronic lower back pain   . On home oxygen therapy     "don't know how much; use it at night" (02/18/2016)    Past Surgical History  Procedure Laterality Date  . Cardiac electrophysiology mapping and ablation  05/2015  . Colectomy  11/10/2013    "diverticulitis"  . Peripheral vascular catheterization N/A 08/02/2015    Procedure: Abdominal Aortogram w/Lower Extremity;  Surgeon: Katha Cabal, MD;  Location: [place] CV LAB;  Service: Cardiovascular;  Laterality: N/A;  . Peripheral vascular catheterization  08/02/2015    Procedure: Lower Extremity Intervention;  Surgeon: Katha Cabal, MD;  Location: [place] CV LAB;  Service: Cardiovascular;;  . Tee without cardioversion N/A 02/17/2016    Procedure: TRANSESOPHAGEAL ECHOCARDIOGRAM (TEE);  Surgeon: Sanda Klein, MD;  Location: [place];  Service: Cardiovascular;  Laterality: N/A;  . Tonsillectomy  1950  . Inguinal hernia repair Left 1978  . Colon surgery    . Cardiac catheterization  11/2014    [place]  . Nasal sinus surgery Left 04/1987  . Wisdom tooth extraction  1979    There were no vitals filed for this visit.      Subjective Assessment - 02/26/16 1311    Subjective Patient reports having success with  tikosyn treatment with less a-fib. He reports no change in balance but does feel a little stronger; He has been put on a restriction of no competitive weight lifting;    Pertinent History personal factors affecting rehab: numbness in feet, A-fib (somewhat controlled), severe hearing impaired, lives alone;    How long can you sit comfortably? NA   How long can you stand comfortably? unknown;    How long can you walk comfortably? short of breath after walking 20 feet;    Patient Stated Goals Improve balance/steadiness; improve strength; get off O2;    Currently in Pain? No/denies            [facility] PT Assessment - 02/26/16 0001  Observation/Other Assessments   Lower Extremity Functional Scale  60/80 (the lower the score the greater the disability; improved from initial eval on 01/20/16 which was 50/80)   [place] Jans[place] Balance Test   Sit to Stand Able to stand without using hands and stabilize independently   Standing Unsupported Able to stand safely 2 minutes   Sitting with Back Unsupported but Feet Supported on Floor or Stool Able to sit safely and securely 2 minutes   Stand to Sit Sits safely with minimal use of hands   Transfers Able to transfer safely, minor use of hands   Standing Unsupported with Eyes Closed Able to stand 10 seconds safely   Standing Ubsupported with Feet  Together Able to place feet together independently and stand 1 minute safely   From Standing, Reach Forward with Outstretched Arm Can reach confidently >25 cm (10")   From Standing Position, Pick up Object from Floor Able to pick up shoe safely and easily   From Standing Position, Turn to Look Behind Over each Shoulder Looks behind from both sides and weight shifts well   Turn 360 Degrees Able to turn 360 degrees safely in 4 seconds or less   Standing Unsupported, Alternately Place Feet on Step/Stool Able to stand independently and safely and complete 8 steps in 20 seconds   Standing Unsupported, One Foot in Front Able to plae foot ahead of the other independently and hold 30 seconds   Standing on One Leg Tries to lift leg/unable to hold 3 seconds but remains standing independently   Total Score 52   Berg comment: lower fall risk <25%        TREATMENT: Warm up on Nustep BUE/BLE level 2 x3 min (Unbilled);  Instructed patient in [place] Balance scale and other outcome measures to assess progress towards goals; see above;  Patient had increased difficulty with SLS/tandem stance tasks. Re-educated patient in HEP; Tandem stance on upside down 1/2 bolster 10 sec hold x2 each foot in front with cues for weight shift for better balance control; SLS on 1/2 bolster 10 sec hold with fingertip hold for balance;  Patient required min VCs for balance stability, including to increase trunk control for less loss of balance with smaller base of support                       PT Education - 02/26/16 1344    Education provided Yes   Education Details progress towards goals, balance exercise, HEP re-educated;    Person(s) Educated Patient   Methods Explanation;Verbal cues   Comprehension Verbalized understanding;Returned demonstration;Verbal cues required             PT Long Term Goals - 02/26/16 1312    PT LONG TERM GOAL #1   Title Patient will be independent in home exercise  program to improve strength/mobility for better functional independence with ADLs. by 03/25/16   Time 4   Period Weeks   Status On-going   PT LONG TERM GOAL #2   Title Patient will tolerate 5 seconds of single leg stance without loss of balance to improve ability to get in and out of shower safely. by 03/25/16   Time 4   Period Weeks   Status Partially Met   PT LONG TERM GOAL #3   Title  Patient will demonstrate an improved Berg Balance Score of > 52/56 as to demonstrate improved balance with ADLs such as sitting/standing and transfer balance and reduced fall risk. by 03/25/16  Time 4   Period Weeks   Status Achieved   PT LONG TERM GOAL #4   Title Patient will be independent in ambulating on uneven surfaces without AD, without sway or loss of balance to demonstrate improved ability for walking in yard by 03/25/16   Time 4   Period Weeks   Status Achieved   PT LONG TERM GOAL #5   Title Patient will increase BLE gross strength to 4+/5 as to improve functional strength for independent gait, increased standing tolerance and increased ADL ability. by 03/25/16   Time 4   Period Weeks   Status Achieved               Plan - 02/26/16 1345    Clinical Impression Statement Patient seems to be doing well. He had a tikosyn treatment which seems to have helped his a-fib. PT instructed patient in outcome measures to assess progress towards goals. He continues to have instability in ankles with difficulty with static stance tasks such as SLS and tandem stance. Re-educated patient in HEP; He would benefit from additional skilled PT Intervention to improve balance/gait safety and reduce fall risk; Plan to transition to gym program for continued aerobic and strengthening program.    Rehab Potential Fair   Clinical Impairments Affecting Rehab Potential positive: motivated, good family support; negative: co-morbidities including hard of hearing; Patient's clinical presentation is evolving as he has  uncontrolled A-fib and shortness of breath on exertion with SPO2 readings going up/down;    PT Frequency 2x / week   PT Duration 4 weeks   PT Treatment/Interventions Cryotherapy;Electrical Stimulation;Moist Heat;Balance training;Therapeutic exercise;Therapeutic activities;Functional mobility training;Stair training;Gait training;DME Instruction;Neuromuscular re-education;Patient/family education;Taping;Energy conservation   PT Next Visit Plan work on dynamic balance (high level), high level strengthening (leg press);   PT Home Exercise Plan Continue as prescribed.    Consulted and Agree with Plan of Care Patient      Patient will benefit from skilled therapeutic intervention in order to improve the following deficits and impairments:  Decreased endurance, Cardiopulmonary status limiting activity, Decreased activity tolerance, Decreased strength, Difficulty walking, Decreased mobility, Decreased balance, Decreased safety awareness  Visit Diagnosis: Muscle weakness (generalized) - Plan: PT plan of care cert/re-cert  Difficulty in walking, not elsewhere classified - Plan: PT plan of care cert/re-cert  Unsteadiness on feet - Plan: PT plan of care cert/re-cert     Problem List Patient Active Problem List   Diagnosis Date Noted  . Visit for monitoring Tikosyn therapy 02/17/2016  . Chronic respiratory failure with hypoxia ([place]) 01/16/2016  . Other allergic rhinitis 01/16/2016  . Hereditary and idiopathic peripheral neuropathy 12/20/2015  . Atrial fibrillation, unspecified type ([place]) 09/25/2015  . Sensorineural hearing loss of both ears 09/12/2015  . OSA on CPAP 09/12/2015  . Colon polyp 09/12/2015  . Diverticulitis 09/12/2015  . Hemorrhoids, internal 09/12/2015  . Obesity 09/12/2015  . History of retinal vein occlusion 09/12/2015  . Screening for skin cancer 09/12/2015  . Benign prostatic hypertrophy without urinary obstruction 08/05/2015  . Pseudoaneurysm of left femoral artery ([place])  08/01/2015  . Ascending aortic aneurysm ([place]) 08/01/2015  . Essential hypertension   . Hyperlipidemia   . Persistent atrial fibrillation ([place])   . ED (erectile dysfunction) of organic origin 09/26/2014    Letti Towell PT, DPT 02/26/2016, 2:40 PM   [place] MAIN [facility] SERVICES [address] [place], [place], [address] Phone: [phone]   Fax:  [phone]  Name: ADARSH MUNDORF MRN: [id number] Date  of Birth: 1946-07-24

## 2016-02-27 ENCOUNTER — Telehealth: Payer: Self-pay

## 2016-02-27 LAB — MAGNESIUM: MAGNESIUM: 2.2 mg/dL (ref 1.6–2.3)

## 2016-02-27 LAB — BASIC METABOLIC PANEL
BUN/Creatinine Ratio: 9 — ABNORMAL LOW (ref 10–24)
BUN: 11 mg/dL (ref 8–27)
CO2: 21 mmol/L (ref 18–29)
Calcium: 9.1 mg/dL (ref 8.6–10.2)
Chloride: 102 mmol/L (ref 96–106)
Creatinine, Ser: 1.16 mg/dL (ref 0.76–1.27)
GFR calc Af Amer: 74 mL/min/{1.73_m2} (ref >59)
GFR, EST NON AFRICAN AMERICAN: 64 mL/min/{1.73_m2} (ref >59)
GLUCOSE: 113 mg/dL — AB (ref 65–99)
Potassium: 3.9 mmol/L (ref 3.5–5.2)
Sodium: 141 mmol/L (ref 134–144)

## 2016-02-27 NOTE — Telephone Encounter (Signed)
Sent a Prior Authorization for Tikosyn 250 mg one tablet twice day under [email or web address].

## 2016-02-28 ENCOUNTER — Encounter: Payer: Self-pay | Admitting: Physical Therapy

## 2016-03-02 ENCOUNTER — Ambulatory Visit: Payer: Medicare Other | Admitting: Physical Therapy

## 2016-03-02 ENCOUNTER — Encounter: Payer: Self-pay | Admitting: Physical Therapy

## 2016-03-02 DIAGNOSIS — R262 Difficulty in walking, not elsewhere classified: Secondary | ICD-10-CM | POA: Diagnosis not present

## 2016-03-02 DIAGNOSIS — R2681 Unsteadiness on feet: Secondary | ICD-10-CM | POA: Diagnosis not present

## 2016-03-02 DIAGNOSIS — M6281 Muscle weakness (generalized): Secondary | ICD-10-CM

## 2016-03-03 NOTE — Telephone Encounter (Signed)
Pt has been approved for Tikosyn 250 mg until 02/26/2017.

## 2016-03-03 NOTE — Therapy (Signed)
[place] MAIN [facility] SERVICES [address] [place], [place], [address] Phone: [phone]   Fax:  [phone]  Physical Therapy Treatment  Patient Details  Name: David Rice MRN: [id number] Date of Birth: December 18, 1945 Referring Provider: Dr. Narda Amber;   Encounter Date: 03/02/2016      PT End of Session - 03/03/16 0849    Visit Number 8   Number of Visits 17   Date for PT Re-Evaluation 03/25/16   Authorization Type Gcode 8   Authorization Time Period 10   PT Start Time 1625   PT Stop Time 1705   PT Time Calculation (min) 40 min   Equipment Utilized During Treatment Gait belt   Activity Tolerance Patient tolerated treatment well   Behavior During Therapy [facility] for tasks assessed/performed      Past Medical History  Diagnosis Date  . Bowel obstruction ([place])   . Hearing impaired   . Diverticulitis   . Hypertension   . Essential hypertension   . Hyperlipidemia   . Persistent atrial fibrillation [facility])     Status post ablation in July 2016 in [place].  . Thoracic aortic aneurysm without rupture ([place])   . AAA (abdominal aortic aneurysm) ([place])   . Aorta aneurysm ([place])     "@ the valve; 4.5cm; [name] is watching it" (02/18/2016)  . OSA on CPAP   . GERD (gastroesophageal reflux disease)   . Arthritis     "spine, shoulders, legs" (02/18/2016)  . Chronic lower back pain   . On home oxygen therapy     "don't know how much; use it at night" (02/18/2016)    Past Surgical History  Procedure Laterality Date  . Cardiac electrophysiology mapping and ablation  05/2015  . Colectomy  11/10/2013    "diverticulitis"  . Peripheral vascular catheterization N/A 08/02/2015    Procedure: Abdominal Aortogram w/Lower Extremity;  Surgeon: Katha Cabal, MD;  Location: [place] CV LAB;  Service: Cardiovascular;  Laterality: N/A;  . Peripheral vascular catheterization  08/02/2015    Procedure: Lower Extremity Intervention;  Surgeon: Katha Cabal, MD;  Location: [place] CV LAB;  Service: Cardiovascular;;  . Tee without cardioversion N/A 02/17/2016    Procedure: TRANSESOPHAGEAL ECHOCARDIOGRAM (TEE);  Surgeon: Sanda Klein, MD;  Location: [place];  Service: Cardiovascular;  Laterality: N/A;  . Tonsillectomy  1950  . Inguinal hernia repair Left 1978  . Colon surgery    . Cardiac catheterization  11/2014    [place]  . Nasal sinus surgery Left 04/1987  . Wisdom tooth extraction  1979    There were no vitals filed for this visit.      Subjective Assessment - 03/02/16 1630    Subjective Patient reports doing well; He denies any pain; He reports having some stress with helping his mom and brother; reports A-fib has been doing well and that tikosyn worked well;    Pertinent History personal factors affecting rehab: numbness in feet, A-fib (somewhat controlled), severe hearing impaired, lives alone;    How long can you sit comfortably? NA   How long can you stand comfortably? unknown;    How long can you walk comfortably? short of breath after walking 20 feet;    Patient Stated Goals Improve balance/steadiness; improve strength; get off O2;    Currently in Pain? No/denies        TREATMENT: Warm up on Treadmill 2 HHA x3 min, 2.5 mph (unbilled); Backwards walking on treadmill  1.2 mph x1 min with cues to increase step length; Side stepping on treadmill 0.8 mph x1 min each with cues to increase step length for better balance control;  Forward/backward step over big bolster with resisted weighted gait 17.5# x5 reps Side step over big bolster with 17.5# weighted gait, x10 reps each direction, with min VCs to stop after each step over for balance and to increase step length to allow room for both feet;  Standing on trampoline: Alternate march x15 unsupported with cues to slow down speed for SLS challenge; Feet apart and then together, balloon taps alternate arms x2 min each;  SLS on star, with toe  taps on cones #6 outside base of support x2 each; CGA with LLE, and min A with RLE for better balance control;  SLS on star, golfer's pick up cones #6, min A for balance and cues to improve weight shift for increased trunk control;  Standing on BOSU (flat side up), no rails, heel/toe raises x15 with cues to improve weight shift; Standing on BOUS, flat side up, mini squat without rails x15;  Patient required min VCs for balance stability, including to increase trunk control for less loss of balance with smaller base of support                           PT Education - 03/03/16 0849    Education provided Yes   Education Details balance exercise, ways to improve compliance with HEP   Person(s) Educated Patient   Methods Explanation;Verbal cues   Comprehension Verbalized understanding;Returned demonstration;Verbal cues required             PT Long Term Goals - 02/26/16 1312    PT LONG TERM GOAL #1   Title Patient will be independent in home exercise program to improve strength/mobility for better functional independence with ADLs. by 03/25/16   Time 4   Period Weeks   Status On-going   PT LONG TERM GOAL #2   Title Patient will tolerate 5 seconds of single leg stance without loss of balance to improve ability to get in and out of shower safely. by 03/25/16   Time 4   Period Weeks   Status Partially Met   PT LONG TERM GOAL #3   Title  Patient will demonstrate an improved Berg Balance Score of > 52/56 as to demonstrate improved balance with ADLs such as sitting/standing and transfer balance and reduced fall risk. by 03/25/16   Time 4   Period Weeks   Status Achieved   PT LONG TERM GOAL #4   Title Patient will be independent in ambulating on uneven surfaces without AD, without sway or loss of balance to demonstrate improved ability for walking in yard by 03/25/16   Time 4   Period Weeks   Status Achieved   PT LONG TERM GOAL #5   Title Patient will increase  BLE gross strength to 4+/5 as to improve functional strength for independent gait, increased standing tolerance and increased ADL ability. by 03/25/16   Time 4   Period Weeks   Status Achieved               Plan - 03/03/16 0850    Clinical Impression Statement Patient instructed in advanced balance exercise. Patient seems to be doing better with less A-fib due to recent success with tikosyn treatment. He does demonstrates decreased ankle stability requiring increased cues for better strategies with SLS tasks and dynamic  stability. Patient would benefit from additional skilled PT Intervention to improve balance/gait safety and safely transition towards independent exercise program.    Rehab Potential Fair   Clinical Impairments Affecting Rehab Potential positive: motivated, good family support; negative: co-morbidities including hard of hearing; Patient's clinical presentation is evolving as he has uncontrolled A-fib and shortness of breath on exertion with SPO2 readings going up/down;    PT Frequency 2x / week   PT Duration 4 weeks   PT Treatment/Interventions Cryotherapy;Electrical Stimulation;Moist Heat;Balance training;Therapeutic exercise;Therapeutic activities;Functional mobility training;Stair training;Gait training;DME Instruction;Neuromuscular re-education;Patient/family education;Taping;Energy conservation   PT Next Visit Plan work on dynamic balance (high level), high level strengthening (leg press);   PT Home Exercise Plan Continue as prescribed.    Consulted and Agree with Plan of Care Patient      Patient will benefit from skilled therapeutic intervention in order to improve the following deficits and impairments:  Decreased endurance, Cardiopulmonary status limiting activity, Decreased activity tolerance, Decreased strength, Difficulty walking, Decreased mobility, Decreased balance, Decreased safety awareness  Visit Diagnosis: Difficulty in walking, not elsewhere  classified  Unsteadiness on feet  Muscle weakness (generalized)     Problem List Patient Active Problem List   Diagnosis Date Noted  . Visit for monitoring Tikosyn therapy 02/17/2016  . Chronic respiratory failure with hypoxia ([place]) 01/16/2016  . Other allergic rhinitis 01/16/2016  . Hereditary and idiopathic peripheral neuropathy 12/20/2015  . Atrial fibrillation, unspecified type ([place]) 09/25/2015  . Sensorineural hearing loss of both ears 09/12/2015  . OSA on CPAP 09/12/2015  . Colon polyp 09/12/2015  . Diverticulitis 09/12/2015  . Hemorrhoids, internal 09/12/2015  . Obesity 09/12/2015  . History of retinal vein occlusion 09/12/2015  . Screening for skin cancer 09/12/2015  . Benign prostatic hypertrophy without urinary obstruction 08/05/2015  . Pseudoaneurysm of left femoral artery ([place]) 08/01/2015  . Ascending aortic aneurysm ([place]) 08/01/2015  . Essential hypertension   . Hyperlipidemia   . Persistent atrial fibrillation ([place])   . ED (erectile dysfunction) of organic origin 09/26/2014    Gabbrielle Mcnicholas PT, DPT 03/03/2016, 8:51 AM  [place] MAIN [facility] SERVICES [address] [place], [place], [address] Phone: [phone]   Fax:  [phone]  Name: ABASS MISENER MRN: [id number] Date of Birth: 04-16-1946

## 2016-03-04 ENCOUNTER — Encounter: Payer: Self-pay | Admitting: Physical Therapy

## 2016-03-04 ENCOUNTER — Ambulatory Visit: Payer: Medicare Other | Admitting: Physical Therapy

## 2016-03-04 DIAGNOSIS — M6281 Muscle weakness (generalized): Secondary | ICD-10-CM

## 2016-03-04 DIAGNOSIS — R2681 Unsteadiness on feet: Secondary | ICD-10-CM | POA: Diagnosis not present

## 2016-03-04 DIAGNOSIS — R262 Difficulty in walking, not elsewhere classified: Secondary | ICD-10-CM | POA: Diagnosis not present

## 2016-03-04 NOTE — Therapy (Signed)
[place] MAIN [facility] SERVICES [address] [place], [place], [address] Phone: [phone]   Fax:  [phone]  Physical Therapy Treatment  Patient Details  Name: David Rice MRN: [id number] Date of Birth: [birth year] Referring Provider: Dr. Narda Amber;   Encounter Date: 03/04/2016      PT End of Session - 03/04/16 1344    Visit Number 9   Number of Visits 17   Date for PT Re-Evaluation 03/25/16   Authorization Type Gcode 9   Authorization Time Period 10   PT Start Time 1300   PT Stop Time 1345   PT Time Calculation (min) 45 min   Equipment Utilized During Treatment Gait belt   Activity Tolerance Patient tolerated treatment well   Behavior During Therapy [facility] for tasks assessed/performed      Past Medical History  Diagnosis Date  . Bowel obstruction ([place])   . Hearing impaired   . Diverticulitis   . Hypertension   . Essential hypertension   . Hyperlipidemia   . Persistent atrial fibrillation [facility])     Status post ablation in July 2016 in [place].  . Thoracic aortic aneurysm without rupture ([place])   . AAA (abdominal aortic aneurysm) ([place])   . Aorta aneurysm ([place])     "@ the valve; 4.5cm; [name] is watching it" (02/18/2016)  . OSA on CPAP   . GERD (gastroesophageal reflux disease)   . Arthritis     "spine, shoulders, legs" (02/18/2016)  . Chronic lower back pain   . On home oxygen therapy     "don't know how much; use it at night" (02/18/2016)    Past Surgical History  Procedure Laterality Date  . Cardiac electrophysiology mapping and ablation  05/2015  . Colectomy  11/10/2013    "diverticulitis"  . Peripheral vascular catheterization N/A 08/02/2015    Procedure: Abdominal Aortogram w/Lower Extremity;  Surgeon: Katha Cabal, MD;  Location: [place] [facility];  Service: Cardiovascular;  Laterality: N/A;  . Peripheral vascular catheterization  08/02/2015    Procedure: Lower Extremity Intervention;  Surgeon: Katha Cabal, MD;  Location: [place] [facility];  Service: Cardiovascular;;  . Tee without cardioversion N/A 02/17/2016    Procedure: TRANSESOPHAGEAL ECHOCARDIOGRAM (TEE);  Surgeon: Sanda Klein, MD;  Location: [place];  Service: Cardiovascular;  Laterality: N/A;  . Tonsillectomy  1950  . Inguinal hernia repair Left 1978  . Colon surgery    . Cardiac catheterization  11/2014    [place]  . Nasal sinus surgery Left 04/1987  . Wisdom tooth extraction  1979    There were no vitals filed for this visit.      Subjective Assessment - 03/04/16 1306    Subjective Patient reports ingesting a lot of coffee this morning and is feeling a little jittery;    Pertinent History personal factors affecting rehab: numbness in feet, A-fib (somewhat controlled), severe hearing impaired, lives alone;    How long can you sit comfortably? NA   How long can you stand comfortably? unknown;    How long can you walk comfortably? short of breath after walking 20 feet;    Patient Stated Goals Improve balance/steadiness; improve strength; get off O2;    Currently in Pain? No/denies         TREATMENT: Warm up on Treadmill 2 HHA x3 min, 2.5 mph (unbilled);  Side step on SLS hold 2-3 sec x10 each direction with cues to progress to jumping; Marching  in place with BUE ball up/down, side/side with cues to follow ball and work on improving coordination with improved march; Reaching for cone at shoulder height and placing on floor and then picking up with both hands and place on tall shelf x5 reps;  PT advanced HEP- see patient instructions;  Leg press, BLE plate 150# heel raises 2x15 with cues to keep knees straight and to relax into full DF for better ROM;  Diagonal steps over narrow beam with green tband around ankles x2 laps without rails and min Vcs to increase step length; Forward walking on narrow beam with head turns side/side x2 laps; Side stepping with heel/toe off x1 laps each  direction; Forward lunges with BUE ball up/down x2 laps Patient required min A for balance and mod VCs for increased weight shift to improve balance;  Standing on trampoline: 3 steps march then SLS x5 each LE with cues for weight shift;  Squat jump with BLE x10 unsupported with cues to increase push off through toes;   Patient required min VCs for balance stability, including to increase trunk control for less loss of balance with smaller base of support                           PT Education - 03/04/16 1344    Education provided Yes   Education Details balance exercise, HEP advanced;    Person(s) Educated Patient   Methods Explanation;Verbal cues   Comprehension Verbalized understanding;Returned demonstration;Verbal cues required             PT Long Term Goals - 02/26/16 1312    PT LONG TERM GOAL #1   Title Patient will be independent in home exercise program to improve strength/mobility for better functional independence with ADLs. by 03/25/16   Time 4   Period Weeks   Status On-going   PT LONG TERM GOAL #2   Title Patient will tolerate 5 seconds of single leg stance without loss of balance to improve ability to get in and out of shower safely. by 03/25/16   Time 4   Period Weeks   Status Partially Met   PT LONG TERM GOAL #3   Title  Patient will demonstrate an improved Berg Balance Score of > 52/56 as to demonstrate improved balance with ADLs such as sitting/standing and transfer balance and reduced fall risk. by 03/25/16   Time 4   Period Weeks   Status Achieved   PT LONG TERM GOAL #4   Title Patient will be independent in ambulating on uneven surfaces without AD, without sway or loss of balance to demonstrate improved ability for walking in yard by 03/25/16   Time 4   Period Weeks   Status Achieved   PT LONG TERM GOAL #5   Title Patient will increase BLE gross strength to 4+/5 as to improve functional strength for independent gait, increased  standing tolerance and increased ADL ability. by 03/25/16   Time 4   Period Weeks   Status Achieved               Plan - 03/04/16 1344    Clinical Impression Statement Instructed patient in advanced balance exercise; he had increased difficulty on narrow beam with decreased ankle stability; Patient required min Vcs to improve weight shift and increase erect posture for better balance; Patient also required min VCs for increased ankle/knee and hip strategies for increased balance. Patient would benefit from additional skilled PT intervention  to improve balance/gait safety and reduce fall risk;    Rehab Potential Fair   Clinical Impairments Affecting Rehab Potential positive: motivated, good family support; negative: co-morbidities including hard of hearing; Patient's clinical presentation is evolving as he has uncontrolled A-fib and shortness of breath on exertion with SPO2 readings going up/down;    PT Frequency 2x / week   PT Duration 4 weeks   PT Treatment/Interventions Cryotherapy;Electrical Stimulation;Moist Heat;Balance training;Therapeutic exercise;Therapeutic activities;Functional mobility training;Stair training;Gait training;DME Instruction;Neuromuscular re-education;Patient/family education;Taping;Energy conservation   PT Next Visit Plan work on dynamic balance (high level), high level strengthening (leg press);   PT Home Exercise Plan advanced- see patient instructions;    Consulted and Agree with Plan of Care Patient      Patient will benefit from skilled therapeutic intervention in order to improve the following deficits and impairments:  Decreased endurance, Cardiopulmonary status limiting activity, Decreased activity tolerance, Decreased strength, Difficulty walking, Decreased mobility, Decreased balance, Decreased safety awareness  Visit Diagnosis: Difficulty in walking, not elsewhere classified  Unsteadiness on feet  Muscle weakness (generalized)     Problem  List Patient Active Problem List   Diagnosis Date Noted  . Visit for monitoring Tikosyn therapy 02/17/2016  . Chronic respiratory failure with hypoxia ([place]) 01/16/2016  . Other allergic rhinitis 01/16/2016  . Hereditary and idiopathic peripheral neuropathy 12/20/2015  . Atrial fibrillation, unspecified type ([place]) 09/25/2015  . Sensorineural hearing loss of both ears 09/12/2015  . OSA on CPAP 09/12/2015  . Colon polyp 09/12/2015  . Diverticulitis 09/12/2015  . Hemorrhoids, internal 09/12/2015  . Obesity 09/12/2015  . History of retinal vein occlusion 09/12/2015  . Screening for skin cancer 09/12/2015  . Benign prostatic hypertrophy without urinary obstruction 08/05/2015  . 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 Ascending aortic aneurysm ([place]) 08/01/2015  . Essential hypertension   . Hyperlipidemia   . Persistent atrial fibrillation ([place])   . ED (erectile dysfunction) of organic origin 09/26/2014    Makiyla Linch PT, DPT 03/04/2016, 1:46 PM  [place] MAIN [facility] SERVICES [address] [place], [place], [address] Phone: [phone]   Fax:  [phone]  Name: ALEXIOS KEOWN MRN: [id number] Date of Birth: [birth year]

## 2016-03-04 NOTE — Patient Instructions (Addendum)
  Bound: Lateral   Start beside incline. Jump, pushing up and out to side, gaining distance and height. Land on uphill foot only. Push off immediately and repeat downhill, landing on downhill foot only. Repeat _5-10__ times. Repeat on other side for set. Rest _2__ seconds after set. Do _1__ sets per session.  START with just stepping side to side, and balance on one foot, then progress to jumping http://plyo.exer.[email or web address]     Copyright  VHI. All rights reserved.  Marching in Place: F[place] in place, move ball up/down. Track ball movement with head and eyes. Repeat __10__ times per session. Do _1___ sessions per day. Try to keep marches high and work on improving smoothness of movement;   Copyright  VHI. All rights reserved.  Marching in Place: Ball Throw Hand to [employer] in place, throw ball from hand to hand. Track ball movement with head and eyes. Repeat __10__ times per session. Do __1__ sessions per day.   Copyright  VHI. All rights reserved.  Reaching / Placing Object in [place]   With feet apart, using two hands, pick up object located in front and place object to right side at shoulder level. Repeat _10___ times per session. Do 1____ sessions per day. Repeat on compliant surface: ________.  Copyright  VHI. All rights reserved.  Reaching / Placing Object, Diagonal Pattern   With feet apart, use two hands to pick up object down on right side and place on surface up on opposite side. Repeat _10___ times per session. Do __1__ sessions per day. Repeat on compliant surface: ________. Repeat sequence to opposite side.  Copyright  VHI. All rights reserved.

## 2016-03-05 ENCOUNTER — Encounter: Payer: Self-pay | Admitting: Physical Therapy

## 2016-03-09 ENCOUNTER — Other Ambulatory Visit: Payer: Self-pay | Admitting: *Deleted

## 2016-03-09 ENCOUNTER — Telehealth: Payer: Self-pay | Admitting: Internal Medicine

## 2016-03-09 ENCOUNTER — Ambulatory Visit: Payer: Medicare Other | Admitting: Physical Therapy

## 2016-03-09 ENCOUNTER — Other Ambulatory Visit: Payer: Self-pay

## 2016-03-09 DIAGNOSIS — E876 Hypokalemia: Secondary | ICD-10-CM

## 2016-03-09 MED ORDER — DOFETILIDE 250 MCG PO CAPS: 250.0000 ug | ORAL_CAPSULE | Freq: Two times a day (BID) | ORAL | Status: DC

## 2016-03-09 MED ORDER — WARFARIN SODIUM 5 MG PO TABS: ORAL_TABLET | ORAL | Status: DC

## 2016-03-09 NOTE — Progress Notes (Signed)
Left message for pt to call back  °

## 2016-03-09 NOTE — Telephone Encounter (Signed)
Rx sent per pt request 

## 2016-03-09 NOTE — Telephone Encounter (Signed)
°*  STAT* If patient is at the pharmacy, call can be transferred to refill team.   1. Which medications need to be refilled? (please list name of each medication and dose if known) warfin and Tikosyn   2. Which pharmacy/location (including street and city if local pharmacy) is medication to be sent to? [facility] in [place]    3. Do they need a 30 day or 90 day supply? Just about 5 days supply   Pt is out of town and it was last minute can't go back home to get medications Needs [place] to send it in.

## 2016-03-09 NOTE — Telephone Encounter (Signed)
Pt requesting refill on Coumadin please see note below. Tikosyn refilled to [facility].

## 2016-03-09 NOTE — Progress Notes (Signed)
On dofetilide target K > 4.0   Almost there so would have him repeat K OFF supplements in about 10 days

## 2016-03-10 ENCOUNTER — Encounter: Payer: Self-pay | Admitting: Physical Therapy

## 2016-03-11 ENCOUNTER — Encounter: Payer: Self-pay | Admitting: Physical Therapy

## 2016-03-11 ENCOUNTER — Ambulatory Visit: Payer: Medicare Other | Admitting: Physical Therapy

## 2016-03-16 ENCOUNTER — Ambulatory Visit: Payer: Medicare Other | Attending: Neurology | Admitting: Physical Therapy

## 2016-03-16 ENCOUNTER — Encounter: Payer: Self-pay | Admitting: Physical Therapy

## 2016-03-16 DIAGNOSIS — M6281 Muscle weakness (generalized): Secondary | ICD-10-CM | POA: Insufficient documentation

## 2016-03-16 DIAGNOSIS — R262 Difficulty in walking, not elsewhere classified: Secondary | ICD-10-CM | POA: Insufficient documentation

## 2016-03-16 DIAGNOSIS — R2681 Unsteadiness on feet: Secondary | ICD-10-CM | POA: Insufficient documentation

## 2016-03-17 ENCOUNTER — Encounter: Payer: Self-pay | Admitting: Internal Medicine

## 2016-03-17 NOTE — Therapy (Signed)
[place] MAIN [facility] SERVICES [address] [place], [place], [address] Phone: [phone]   Fax:  [phone]  Physical Therapy Treatment/Discharge Summary  Patient Details  Name: David Rice MRN: [id number] Date of Birth: 1946/01/11 Referring Provider: Dr. Narda Amber;   Encounter Date: 03/16/2016      PT End of Session - 03/16/16 1456    Visit Number 10   Number of Visits 17   Date for PT Re-Evaluation 03/25/16   Authorization Type Gcode 10   Authorization Time Period 10   PT Start Time 1345   PT Stop Time 1430   PT Time Calculation (min) 45 min   Equipment Utilized During Treatment Gait belt   Activity Tolerance Patient tolerated treatment well   Behavior During Therapy [facility] for tasks assessed/performed      Past Medical History  Diagnosis Date  . Bowel obstruction ([place])   . Hearing impaired   . Diverticulitis   . Hypertension   . Essential hypertension   . Hyperlipidemia   . Persistent atrial fibrillation [facility])     Status post ablation in July 2016 in [place].  . Thoracic aortic aneurysm without rupture ([place])   . AAA (abdominal aortic aneurysm) ([place])   . Aorta aneurysm ([place])     "@ the valve; 4.5cm; [name] is watching it" (02/18/2016)  . OSA on CPAP   . GERD (gastroesophageal reflux disease)   . Arthritis     "spine, shoulders, legs" (02/18/2016)  . Chronic lower back pain   . On home oxygen therapy     "don't know how much; use it at night" (02/18/2016)    Past Surgical History  Procedure Laterality Date  . Cardiac electrophysiology mapping and ablation  05/2015  . Colectomy  11/10/2013    "diverticulitis"  . Peripheral vascular catheterization N/A 08/02/2015    Procedure: Abdominal Aortogram w/Lower Extremity;  Surgeon: Katha Cabal, MD;  Location: [place] [facility];  Service: Cardiovascular;  Laterality: N/A;  . Peripheral vascular catheterization  08/02/2015    Procedure: Lower Extremity  Intervention;  Surgeon: Katha Cabal, MD;  Location: [place] [facility];  Service: Cardiovascular;;  . Tee without cardioversion N/A 02/17/2016    Procedure: TRANSESOPHAGEAL ECHOCARDIOGRAM (TEE);  Surgeon: Sanda Klein, MD;  Location: [place];  Service: Cardiovascular;  Laterality: N/A;  . Tonsillectomy  1950  . Inguinal hernia repair Left 1978  . Colon surgery    . Cardiac catheterization  11/2014    [place]  . Nasal sinus surgery Left 04/1987  . Wisdom tooth extraction  1979    There were no vitals filed for this visit.      Subjective Assessment - 03/16/16 1350    Subjective Patient reports having a hard time dealing with his mother. She is 69 year old and fell and broke her ribs. He reports A-fib has been good but he has not been exercising;    Pertinent History personal factors affecting rehab: numbness in feet, A-fib (somewhat controlled), severe hearing impaired, lives alone;    How long can you sit comfortably? NA   How long can you stand comfortably? unknown;    How long can you walk comfortably? short of breath after walking 20 feet;    Patient Stated Goals Improve balance/steadiness; improve strength; get off O2;    Currently in Pain? No/denies        TREATMENT: Warm up on Treadmill 2.5 mph x4 min (unbilled);  SLS  star kicks x1 each LE with close supervision for balance; Single leg heel raises x10 each LE with 1 rail assist;  Forward stand on 1/2 bolster with feet apart, heel/toe raises x15 without rail assist; Forward stand on 1/2 bolster feet apart, balloon taps x2-3 min with cues for keeping bolster flat and avoid heel/toes touching floor;  Standing foot rocker calf stretch 15 sec hold x3;  Alternate toe taps to 6 inch step x30 sec x2 reps  Side step ups with BUE ball overhead lift in circles x10 each direction; Tandem gait on narrow beam with and without head turns x2 laps; Side step on narrow beam with mini squat and ball toss  x10 reps; Standing on BOSU: Mini squat flat side up x10; Feet together eyes open/closed 10 sec hold without rail assist x3 reps;  Patient required min VCs for balance stability, including to increase trunk control for less loss of balance with smaller base of support  Able to hold SLS for >5 sec each LE;                           PT Education - 03/16/16 1454    Education provided Yes   Education Details balance exercise, HEP, recommendations;    Person(s) Educated Patient   Methods Explanation;Verbal cues   Comprehension Verbalized understanding;Returned demonstration;Verbal cues required             PT Long Term Goals - 03/17/16 1307    PT LONG TERM GOAL #1   Title Patient will be independent in home exercise program to improve strength/mobility for better functional independence with ADLs. by 03/25/16   Time 4   Period Weeks   Status Achieved   PT LONG TERM GOAL #2   Title Patient will tolerate 5 seconds of single leg stance without loss of balance to improve ability to get in and out of shower safely. by 03/25/16   Time 4   Period Weeks   Status Achieved   PT LONG TERM GOAL #3   Title  Patient will demonstrate an improved Berg Balance Score of > 52/56 as to demonstrate improved balance with ADLs such as sitting/standing and transfer balance and reduced fall risk. by 03/25/16   Time 4   Period Weeks   Status Achieved   PT LONG TERM GOAL #4   Title Patient will be independent in ambulating on uneven surfaces without AD, without sway or loss of balance to demonstrate improved ability for walking in yard by 03/25/16   Time 4   Period Weeks   Status Achieved   PT LONG TERM GOAL #5   Title Patient will increase BLE gross strength to 4+/5 as to improve functional strength for independent gait, increased standing tolerance and increased ADL ability. by 03/25/16   Time 4   Period Weeks   Status Achieved               Plan - 03/17/16 1305     Clinical Impression Statement Patient instructed in higher level balance task on even/uneven surfaces. He required min-mod VCs to improve weight shift with more challenging task. Patient continues to demonstrate decreased ankle stability. He is able to hold SLS for >5 sec without loss of balance. Patient has met all goals. He is independent in HEP; Patient is appropriate for discharge from PT at this time. PT has recommended that patient transition to forever fit program for better strengthening/endurance tasks. He is agreeable.  Rehab Potential Fair   Clinical Impairments Affecting Rehab Potential positive: motivated, good family support; negative: co-morbidities including hard of hearing; Patient's clinical presentation is evolving as he has uncontrolled A-fib and shortness of breath on exertion with SPO2 readings going up/down;    PT Frequency 2x / week   PT Duration 4 weeks   PT Treatment/Interventions Cryotherapy;Electrical Stimulation;Moist Heat;Balance training;Therapeutic exercise;Therapeutic activities;Functional mobility training;Stair training;Gait training;DME Instruction;Neuromuscular re-education;Patient/family education;Taping;Energy conservation   PT Next Visit Plan will discharge from PT   PT Home Exercise Plan continue as given; reinforced;    Consulted and Agree with Plan of Care Patient      Patient will benefit from skilled therapeutic intervention in order to improve the following deficits and impairments:  Decreased endurance, Cardiopulmonary status limiting activity, Decreased activity tolerance, Decreased strength, Difficulty walking, Decreased mobility, Decreased balance, Decreased safety awareness  Visit Diagnosis: Difficulty in walking, not elsewhere classified  Unsteadiness on feet  Muscle weakness (generalized)       G-Codes - 03-25-2016 1457    Functional Assessment Tool Used SLS, clinical judgement   Functional Limitation Mobility: Walking and moving around    Mobility: Walking and Moving Around Goal Status [phone]) At least 1 percent but less than 20 percent impaired, limited or restricted   Mobility: Walking and Moving Around Discharge Status [phone]) At least 1 percent but less than 20 percent impaired, limited or restricted      Problem List Patient Active Problem List   Diagnosis Date Noted  . Visit for monitoring Tikosyn therapy 02/17/2016  . Chronic respiratory failure with hypoxia ([place]) 01/16/2016  . Other allergic rhinitis 01/16/2016  . Hereditary and idiopathic peripheral neuropathy 12/20/2015  . Atrial fibrillation, unspecified type ([place]) 09/25/2015  . Sensorineural hearing loss of both ears 09/12/2015  . OSA on CPAP 09/12/2015  . Colon polyp 09/12/2015  . Diverticulitis 09/12/2015  . Hemorrhoids, internal 09/12/2015  . Obesity 09/12/2015  . History of retinal vein occlusion 09/12/2015  . Screening for skin cancer 09/12/2015  . Benign prostatic hypertrophy without urinary obstruction 08/05/2015  . 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 Ascending aortic aneurysm ([place]) 08/01/2015  . Essential hypertension   . Hyperlipidemia   . Persistent atrial fibrillation ([place])   . ED (erectile dysfunction) of organic origin 09/26/2014    Blenda Wisecup PT, DPT 03/17/2016, 1:07 PM  [place] MAIN [facility] SERVICES [address] [place], [place], [address] Phone: [phone]   Fax:  [phone]  Name: DEMARYIUS IMRAN MRN: [id number] Date of Birth: 10-Jun-1946

## 2016-03-18 ENCOUNTER — Ambulatory Visit (INDEPENDENT_AMBULATORY_CARE_PROVIDER_SITE_OTHER): Payer: Medicare Other

## 2016-03-18 DIAGNOSIS — I4891 Unspecified atrial fibrillation: Secondary | ICD-10-CM

## 2016-03-18 LAB — POCT INR: INR: 2.9

## 2016-03-23 ENCOUNTER — Other Ambulatory Visit (INDEPENDENT_AMBULATORY_CARE_PROVIDER_SITE_OTHER): Payer: Medicare Other

## 2016-03-23 DIAGNOSIS — E876 Hypokalemia: Secondary | ICD-10-CM | POA: Diagnosis not present

## 2016-03-24 LAB — BASIC METABOLIC PANEL
BUN/Creatinine Ratio: 13 (ref 10–24)
BUN: 16 mg/dL (ref 8–27)
CO2: 21 mmol/L (ref 18–29)
CREATININE: 1.26 mg/dL (ref 0.76–1.27)
Calcium: 9.4 mg/dL (ref 8.6–10.2)
Chloride: 103 mmol/L (ref 96–106)
GFR calc Af Amer: 67 mL/min/{1.73_m2} (ref >59)
GFR, EST NON AFRICAN AMERICAN: 58 mL/min/{1.73_m2} — AB (ref >59)
GLUCOSE: 106 mg/dL — AB (ref 65–99)
Potassium: 5.3 mmol/L — ABNORMAL HIGH (ref 3.5–5.2)
SODIUM: 146 mmol/L — AB (ref 134–144)

## 2016-03-26 ENCOUNTER — Encounter: Payer: Self-pay | Admitting: Internal Medicine

## 2016-03-26 ENCOUNTER — Ambulatory Visit (INDEPENDENT_AMBULATORY_CARE_PROVIDER_SITE_OTHER): Payer: Medicare Other | Admitting: Internal Medicine

## 2016-03-26 VITALS — BP 142/88 | HR 63 | Ht 74.0 in | Wt 242.0 lb

## 2016-03-26 DIAGNOSIS — I4891 Unspecified atrial fibrillation: Secondary | ICD-10-CM

## 2016-03-26 DIAGNOSIS — Z79899 Other long term (current) drug therapy: Secondary | ICD-10-CM

## 2016-03-26 DIAGNOSIS — I10 Essential (primary) hypertension: Secondary | ICD-10-CM | POA: Diagnosis not present

## 2016-03-26 NOTE — Progress Notes (Signed)
Patient Care Team: Crecencio Mc, MD as PCP - General (Internal Medicine)   HPI  David Rice is a 70 y.o. male seen in follow-up for atrial fibrillation  With PVCs in the context of sinus bradycardia ; he has treated his sleep apnea  Because of ongoing issues with palpitations, he was admitted for the initiation of dofetilide 4/17  He has had markedly less atrial fibrillation; it seems to be prompted mostly by alcohol   He has a history of atrial fibrillation diagnosed apparently 12/15. Therapies with atenolol and flecainide were not tolerated and he underwent catheter ablation 7/16 [facility] . This was complicated by a left femoral pseudoaneurysm and he underwent coiling in July of this year.  Records and Results Reviewed stress Myoview report from 2015 at [facility] was normal   Antiarrhythmics Date  flecainide 2016/   propafenone 2016 /   dofetilide 4/17        Past Medical History  Diagnosis Date  . Bowel obstruction ([place])   . Hearing impaired   . Diverticulitis   . Hypertension   . Essential hypertension   . Hyperlipidemia   . Persistent atrial fibrillation [facility])     Status post ablation in July 2016 in [place].  . Thoracic aortic aneurysm without rupture ([place])   . AAA (abdominal aortic aneurysm) ([place])   . Aorta aneurysm ([place])     "@ the valve; 4.5cm; [name] is watching it" (02/18/2016)  . OSA on CPAP   . GERD (gastroesophageal reflux disease)   . Arthritis     "spine, shoulders, legs" (02/18/2016)  . Chronic lower back pain   . On home oxygen therapy     "don't know how much; use it at night" (02/18/2016)    Past Surgical History  Procedure Laterality Date  . Cardiac electrophysiology mapping and ablation  05/2015  . Colectomy  11/10/2013    "diverticulitis"  . Peripheral vascular catheterization N/A 08/02/2015    Procedure: Abdominal Aortogram w/Lower Extremity;  Surgeon: Katha Cabal, MD;  Location: [facility];   Service: Cardiovascular;  Laterality: N/A;  . Peripheral vascular catheterization  08/02/2015    Procedure: Lower Extremity Intervention;  Surgeon: Katha Cabal, MD;  Location: [facility];  Service: Cardiovascular;;  . Tee without cardioversion N/A 02/17/2016    Procedure: TRANSESOPHAGEAL ECHOCARDIOGRAM (TEE);  Surgeon: Sanda Klein, MD;  Location: [facility];  Service: Cardiovascular;  Laterality: N/A;  . Tonsillectomy  1950  . Inguinal hernia repair Left 1978  . Colon surgery    . Cardiac catheterization  11/2014    [place]  . Nasal sinus surgery Left 04/1987  . Wisdom tooth extraction  1979    Current Outpatient Prescriptions  Medication Sig Dispense Refill  . atorvastatin (LIPITOR) 10 MG tablet Take 1 tablet (10 mg total) by mouth daily. 90 tablet 3  . dofetilide (TIKOSYN) 250 MCG capsule Take 1 capsule (250 mcg total) by mouth 2 (two) times daily. 10 capsule 1  . esomeprazole (NEXIUM) 20 MG capsule Take 20 mg by mouth daily at 12 noon.    . Lactase (LACTAID PO) Take by mouth. Take 2 capsules daily.    Marland Kitchen loratadine (CLARITIN) 10 MG tablet Take 10 mg by mouth at bedtime.     . mometasone (NASONEX) 50 MCG/ACT nasal spray Place 2 sprays into the nose 2 (two) times daily. (Patient taking differently: Place 2 sprays into the nose daily  as needed. ) 17 g 12  . polyethylene glycol (MIRALAX / GLYCOLAX) packet Take 17 g by mouth daily as needed.    . polyethylene glycol powder (MIRALAX) powder Take 1 Container by mouth once.    . silodosin (RAPAFLO) 8 MG CAPS capsule Take 8 mg by mouth at bedtime.    . traMADol (ULTRAM) 50 MG tablet Take 1 tablet (50 mg total) by mouth every 6 (six) hours as needed. 90 tablet 2  . warfarin (COUMADIN) 5 MG tablet Take as directed by [facility] 10 tablet 0   No current facility-administered medications for this visit.    Allergies  Allergen Reactions  . Lactose Intolerance (Gi)       Review of Systems negative  except from HPI and PMH  Physical Exam BP 142/88 mmHg  Pulse 63  Ht 6\' 2"  (1.88 m)  Wt 242 lb (109.77 kg)  BMI 31.06 kg/m2 Well developed and well nourished in no acute distress HENT normal E scleral and icterus clear Neck Supple JVP flat; carotids brisk and full Clear to ausculation Regular rate and rhythm, no murmurs gallops or rub Soft with active bowel sounds No clubbing cyanosis { Edema Alert and oriented, grossly normal motor and sensory function Skin Warm and Dry  ECG demonstrates sinus rhythm  Rate 63 Intervals 21/07/42  Assessment and  Plan  Atrial fibrillation-paroxysmal  PVCs-frequent  Sinus bradycardia  History of atrial fibrillation ablation  Aortic aneurysm  Obstructive sleep apnea-treated  Hyperkalemia  Obesity   He is tolerating his dofetilide; it has been very effective.  He is on warfarin and tolerating it well. We will broach again in the future the issue of NOACs  Potassium level was elevated; we will repeat this in a couple of weeks. He is on no potassium elevating medications  His blood pressure was borderline elevated; I've encouraged weight loss.

## 2016-03-26 NOTE — Patient Instructions (Addendum)
Medication Instructions:  Your physician recommends that you continue on your current medications as directed. Please refer to the Current Medication list given to you today.  Labwork: Your physician recommends that you return for lab work in: 2 weeks for a BMET  Testing/Procedures: None ordered  Follow-Up: Your physician wants you to follow-up in: 6 months with Dr. Caryl Comes. You will receive a reminder letter in the mail two months in advance. If you don't receive a letter, please call our office to schedule the follow-up appointment.   If you need a refill on your cardiac medications before your next appointment, please call your pharmacy.  Thank you for choosing [facility]!!

## 2016-03-30 ENCOUNTER — Ambulatory Visit (INDEPENDENT_AMBULATORY_CARE_PROVIDER_SITE_OTHER): Payer: Medicare Other | Admitting: Internal Medicine

## 2016-03-30 ENCOUNTER — Encounter: Payer: Self-pay | Admitting: Internal Medicine

## 2016-03-30 ENCOUNTER — Other Ambulatory Visit: Payer: Self-pay | Admitting: *Deleted

## 2016-03-30 VITALS — BP 136/90 | HR 72 | Temp 97.5°F | Resp 18 | Ht 74.0 in | Wt 240.6 lb

## 2016-03-30 DIAGNOSIS — E876 Hypokalemia: Secondary | ICD-10-CM | POA: Diagnosis not present

## 2016-03-30 DIAGNOSIS — I10 Essential (primary) hypertension: Secondary | ICD-10-CM | POA: Diagnosis not present

## 2016-03-30 DIAGNOSIS — I4819 Other persistent atrial fibrillation: Secondary | ICD-10-CM

## 2016-03-30 DIAGNOSIS — E669 Obesity, unspecified: Secondary | ICD-10-CM

## 2016-03-30 DIAGNOSIS — I481 Persistent atrial fibrillation: Secondary | ICD-10-CM | POA: Diagnosis not present

## 2016-03-30 LAB — BASIC METABOLIC PANEL
BUN: 17 mg/dL (ref 6–23)
CHLORIDE: 102 meq/L (ref 96–112)
CO2: 28 mEq/L (ref 19–32)
Calcium: 9 mg/dL (ref 8.4–10.5)
Creatinine, Ser: 1.11 mg/dL (ref 0.40–1.50)
GFR: 69.65 mL/min (ref >60.00)
Glucose, Bld: 114 mg/dL — ABNORMAL HIGH (ref 70–99)
POTASSIUM: 4.1 meq/L (ref 3.5–5.1)
Sodium: 137 mEq/L (ref 135–145)

## 2016-03-30 LAB — MAGNESIUM: Magnesium: 2.2 mg/dL (ref 1.5–2.5)

## 2016-03-30 MED ORDER — WARFARIN SODIUM 5 MG PO TABS: ORAL_TABLET | ORAL | Status: DC

## 2016-03-30 NOTE — Progress Notes (Signed)
Subjective:  Patient ID: David Rice, male    DOB: 1946/09/15  Age: 70 y.o. MRN: [id number]  CC: The primary encounter diagnosis was Hypokalemia. Diagnoses of Persistent atrial fibrillation ([place]), Obesity, and Essential hypertension were also pertinent to this visit.  HPI David Rice presents for follow up on multiple issues  1) exertional dyspnea:  Has improved with medication change to Tikosyn started by cardiology for atrial fib. Was admitted to [facility] on April 6 for Tikosyn  Loading;   ater just 3 days felt amazing.  Has already started to lose weight , but hindered by visiting his mother who is  31 yr old and lives independently in [place].   for the past 3 weeks   ([address] ) .    Cost of Tikosyn: Insurance pays $3500 per year and the cost is $1100  X 4  Not on a potassium supplement anymore  Last potassium 5.3  Taking warfarin  For stroke risk mitigation   Swelling  Of legs noted to occur with prolonged sitting   2) tremor and fine motor control:  Resolved since atrial fib has been better controlled on Tikodyn. Marland Kitchen    3) HTN:  Diastolic elevation noted to persist since April 12 .Wants to defer more medications at this time. Caryl Comes referred him to [facility]. Prefers to do yardwork  .  Last tetanus shot was 5 years ago  4) TAA he was referred to Vascular Surgery for follow up on  4.8 cm C/T referral done   Outpatient Prescriptions Prior to Visit  Medication Sig Dispense Refill  . atorvastatin (LIPITOR) 10 MG tablet Take 1 tablet (10 mg total) by mouth daily. 90 tablet 3  . dofetilide (TIKOSYN) 250 MCG capsule Take 1 capsule (250 mcg total) by mouth 2 (two) times daily. 10 capsule 1  . esomeprazole (NEXIUM) 20 MG capsule Take 20 mg by mouth daily at 12 noon.    . Lactase (LACTAID PO) Take by mouth. Take 2 capsules daily.    Marland Kitchen loratadine (CLARITIN) 10 MG tablet Take 10 mg by mouth at bedtime.     . polyethylene glycol (MIRALAX / GLYCOLAX) packet Take 17 g by  mouth daily as needed.    . silodosin (RAPAFLO) 8 MG CAPS capsule Take 8 mg by mouth at bedtime.    . traMADol (ULTRAM) 50 MG tablet Take 1 tablet (50 mg total) by mouth every 6 (six) hours as needed. 90 tablet 2  . warfarin (COUMADIN) 5 MG tablet Take as directed by Coumadin Clinic 10 tablet 0  . mometasone (NASONEX) 50 MCG/ACT nasal spray Place 2 sprays into the nose 2 (two) times daily. (Patient taking differently: Place 2 sprays into the nose daily as needed. ) 17 g 12  . polyethylene glycol powder (MIRALAX) powder Take 1 Container by mouth once.     No facility-administered medications prior to visit.    Review of Systems;  Patient denies headache, fevers, malaise, unintentional weight loss, skin rash, eye pain, sinus congestion and sinus pain, sore throat, dysphagia,  hemoptysis , cough, dyspnea, wheezing, chest pain, palpitations, orthopnea, edema, abdominal pain, nausea, melena, diarrhea, constipation, flank pain, dysuria, hematuria, urinary  Frequency, nocturia, numbness, tingling, seizures,  Focal weakness, Loss of consciousness,  Tremor, insomnia, depression, anxiety, and suicidal ideation.      Objective:  BP 136/90 mmHg  Pulse 72  Temp(Src) 97.5 F (36.4 C) (Oral)  Resp 18  Ht 6\' 2"  (1.88 m)  Wt 240  lb 9.6 oz (109.135 kg)  BMI 30.88 kg/m2  SpO2 97%  BP Readings from Last 3 Encounters:  03/30/16 136/90  03/26/16 142/88  02/26/16 148/92    Wt Readings from Last 3 Encounters:  03/30/16 240 lb 9.6 oz (109.135 kg)  03/26/16 242 lb (109.77 kg)  02/26/16 243 lb (110.224 kg)    General appearance: alert, cooperative and appears stated age Ears: normal TM's and external ear canals both ears Throat: lips, mucosa, and tongue normal; teeth and gums normal Neck: no adenopathy, no carotid bruit, supple, symmetrical, trachea midline and thyroid not enlarged, symmetric, no tenderness/mass/nodules Back: symmetric, no curvature. ROM normal. No CVA tenderness. Lungs: clear to  auscultation bilaterally Heart: regular rate and rhythm, S1, S2 normal, no murmur, click, rub or gallop Abdomen: soft, non-tender; bowel sounds normal; no masses,  no organomegaly Pulses: 2+ and symmetric Skin: Skin color, texture, turgor normal. No rashes or lesions Lymph nodes: Cervical, supraclavicular, and axillary nodes normal.  No results found for: HGBA1C  Lab Results  Component Value Date   CREATININE 1.11 03/30/2016   CREATININE 1.26 03/23/2016   CREATININE 1.16 02/26/2016    Lab Results  Component Value Date   WBC 5.2 02/18/2016   HGB 14.9 02/18/2016   HCT 45.9 02/18/2016   PLT 128* 02/18/2016   GLUCOSE 114* 03/30/2016   CHOL 145 09/23/2015   TRIG 130.0 09/23/2015   HDL 44.90 09/23/2015   LDLCALC 74 09/23/2015   ALT 26 06/25/2013   AST 18 06/25/2013   NA 137 03/30/2016   K 4.1 03/30/2016   CL 102 03/30/2016   CREATININE 1.11 03/30/2016   BUN 17 03/30/2016   CO2 28 03/30/2016   TSH 1.38 12/20/2015   INR 2.9 03/18/2016    No results found.  Assessment & Plan:   Problem List Items Addressed This Visit    Essential hypertension    Goal is 120/70 given history of TAA . Marland Kitchen Home checks advised with dose titration to follow.       Persistent atrial fibrillation (HCC)    Much improved with initiation of Tikosyn K and Mg are normal (mg is 2.2 ) .  Lab Results  Component Value Date   NA 137 03/30/2016   K 4.1 03/30/2016   CL 102 03/30/2016   CO2 28 03/30/2016        Obesity    I have encourage him to reduce his BMI and encouraged   with goal of 10% of body weight over the next 6 months using a low glycemic index diet and regular exercise a minimum of 5 days per week.         Other Visit Diagnoses    Hypokalemia    -  Primary    Relevant Orders    Magnesium (Completed)    Basic metabolic panel (Completed)       I have discontinued Mr. Done mometasone and polyethylene glycol powder. I am also having him maintain his Lactase (LACTAID PO),  loratadine, esomeprazole, traMADol, atorvastatin, silodosin, polyethylene glycol, and dofetilide.  No orders of the defined types were placed in this encounter.   A total of 25 minutes of face to face time was spent with patient more than half of which was spent in counselling about the above mentioned conditions  and coordination of care  Medications Discontinued During This Encounter  Medication Reason  . mometasone (NASONEX) 50 MCG/ACT nasal spray Patient Preference  . polyethylene glycol powder (MIRALAX) powder Duplicate    Follow-up:  Return in about 4 weeks (around 04/27/2016).   Crecencio Mc, MD

## 2016-03-30 NOTE — Patient Instructions (Signed)
Your goal for blood pressure is 120/70 give your concurrent thoracic aneurysm   We will start HCTZ if needed  When you return In one month   Consider [employer] for the Low Carb Offerings  And  The eggwhite only omelettes at [place] might want to try a premixed protein drink called Premier Protein ,  in the morning.  It is less $$$ and very low sugar.  Available at [facility]   160 cal  30 g protein  1 g sugar 50% calcium needs .  Won't slow you down!   The metamucil is a great tool as well

## 2016-03-30 NOTE — Progress Notes (Signed)
Pre visit review using our clinic review tool, if applicable. No additional management support is needed unless otherwise documented below in the visit note. 

## 2016-04-01 ENCOUNTER — Other Ambulatory Visit: Payer: Self-pay | Admitting: Internal Medicine

## 2016-04-01 ENCOUNTER — Encounter: Payer: Self-pay | Admitting: Internal Medicine

## 2016-04-01 NOTE — Assessment & Plan Note (Signed)
I have encourage him to reduce his BMI and encouraged   with goal of 10% of body weight over the next 6 months using a low glycemic index diet and regular exercise a minimum of 5 days per week.

## 2016-04-01 NOTE — Assessment & Plan Note (Signed)
Much improved with initiation of Tikosyn K and Mg are normal (mg is 2.2 ) .  Lab Results  Component Value Date   NA 137 03/30/2016   K 4.1 03/30/2016   CL 102 03/30/2016   CO2 28 03/30/2016

## 2016-04-01 NOTE — Telephone Encounter (Signed)
Pharmacy requesting a refill on Warfarin 5 mg tab. Please advise

## 2016-04-01 NOTE — Telephone Encounter (Signed)
Refill was sent in 2 days ago. Called pharmacy and they confirmed they had prescription on file.

## 2016-04-01 NOTE — Assessment & Plan Note (Signed)
Goal is 120/70 given history of TAA . Marland Kitchen Home checks advised with dose titration to follow.

## 2016-04-07 ENCOUNTER — Other Ambulatory Visit: Payer: Medicare Other

## 2016-04-07 ENCOUNTER — Ambulatory Visit: Payer: Medicare Other

## 2016-04-07 VITALS — BP 134/98 | HR 74

## 2016-04-07 DIAGNOSIS — I10 Essential (primary) hypertension: Secondary | ICD-10-CM

## 2016-04-07 NOTE — Progress Notes (Signed)
Patient was in today receiving a BP check. He tolerated well. Patient also asked for a order to be sent to [facility] for his oxygen tanks to be picked up.

## 2016-04-07 NOTE — Progress Notes (Signed)
Covering for patient's PCP as patient was seen during Dr. Lupita Dawn half-day. Blood pressure remains elevated on the diastolic side. Per review of notes patient has a history of thoracic aortic aneurysm. Would suggest medication though will defer to patient's PCP and forward her this note. We'll also defer oxygen tank order to PCP.  Tommi Rumps, M.D.

## 2016-04-09 ENCOUNTER — Other Ambulatory Visit: Payer: Self-pay | Admitting: Internal Medicine

## 2016-04-09 MED ORDER — AMLODIPINE BESYLATE 5 MG PO TABS: 5.0000 mg | ORAL_TABLET | Freq: Every day | ORAL | Status: DC

## 2016-04-09 NOTE — Progress Notes (Signed)
Needs to start BP medication.  Amlodipine 5 mg daily .  rx sent  Juliann Pulse can you draft letter to [facility] to return 02 tanks? Thanks

## 2016-04-10 ENCOUNTER — Encounter: Payer: Self-pay | Admitting: Internal Medicine

## 2016-04-10 ENCOUNTER — Encounter: Payer: Self-pay | Admitting: *Deleted

## 2016-04-10 NOTE — Progress Notes (Signed)
Notified patient of medication to start amlodipine 5 mg patient voiced understanding.

## 2016-04-14 DIAGNOSIS — Z85828 Personal history of other malignant neoplasm of skin: Secondary | ICD-10-CM | POA: Diagnosis not present

## 2016-04-14 DIAGNOSIS — D225 Melanocytic nevi of trunk: Secondary | ICD-10-CM | POA: Diagnosis not present

## 2016-04-14 DIAGNOSIS — D2271 Melanocytic nevi of right lower limb, including hip: Secondary | ICD-10-CM | POA: Diagnosis not present

## 2016-04-14 DIAGNOSIS — D2262 Melanocytic nevi of left upper limb, including shoulder: Secondary | ICD-10-CM | POA: Diagnosis not present

## 2016-04-17 ENCOUNTER — Encounter: Payer: Self-pay | Admitting: Internal Medicine

## 2016-04-18 ENCOUNTER — Telehealth: Payer: Self-pay | Admitting: Internal Medicine

## 2016-04-18 NOTE — Telephone Encounter (Signed)
Please cancel the overnight sleep study from [facility]. And have them pick up the 02 generator. thanks

## 2016-04-20 NOTE — Telephone Encounter (Signed)
Sent message to [place] at [place].

## 2016-04-22 ENCOUNTER — Ambulatory Visit (INDEPENDENT_AMBULATORY_CARE_PROVIDER_SITE_OTHER): Payer: Medicare Other

## 2016-04-22 ENCOUNTER — Telehealth: Payer: Self-pay | Admitting: Internal Medicine

## 2016-04-22 DIAGNOSIS — I4891 Unspecified atrial fibrillation: Secondary | ICD-10-CM

## 2016-04-22 LAB — POCT INR: INR: 3.3

## 2016-04-22 NOTE — Telephone Encounter (Signed)
Called over to [facility] cardiac rehab and left message for Angela Nevin to call me back to see if they have what they need.

## 2016-04-22 NOTE — Telephone Encounter (Signed)
Patient wants to make sure klein sent over approval for cardiac rehab at [place].

## 2016-04-22 NOTE — Telephone Encounter (Signed)
Spoke with patient and he was wanting to know about the approval for cardiac rehab here at [facility]. Let him know that someone would be in touch with him tomorrow.

## 2016-04-23 NOTE — Telephone Encounter (Signed)
Spoke with patient to clarify what he needed. I do not see where it was mentioned in his chart anything about cardiac rehab. Patient states that he just needs cardiac clearance so that he can go to [facility] and so that [employer] would cover it under the [employer] program. He just needs cardiac clearance sent over to rehab at [facility] so that he may attend. Will forward to [employer]

## 2016-04-23 NOTE — Telephone Encounter (Signed)
"  Financial controller Exercise Program Physician Clearance" received from [facility]. Dr. Caryl Comes signed and form was faxed back to [place] at [phone]. Confirmation received and the patient is aware this has been sent.

## 2016-04-29 ENCOUNTER — Telehealth: Payer: Self-pay | Admitting: *Deleted

## 2016-04-29 MED ORDER — AMLODIPINE BESYLATE 10 MG PO TABS: 10.0000 mg | ORAL_TABLET | Freq: Every day | ORAL | Status: DC

## 2016-04-29 NOTE — Telephone Encounter (Signed)
He can increase his amlodipine to 10 mg daily.  If he develops fluid retention in his legs, reduce dose to 5 mg and let me know.

## 2016-04-29 NOTE — Telephone Encounter (Signed)
Patient walked into the office this afternoon around 4 pm complaining of hypertension.  He brought blood pressure reading from home of 150/86, 154/96, 153/101, 143/85.  He is started taking amlodipine 5 mg daily for 2 weeks.   Patient does have some upper left chest pain but it is not a new symptom because he has had an injury in the past.  He denies any shortness of breath or nausea.  He states he was feeling fine this morning but to a nap at lunch time and now "is not feeling great".  He does have some unsteady gait but drove himself and is able to walk on his own.  He is having some anxiety. Vitals: BP: 148/90 Pulse: 72 Weight: 246.2 lb Sp 02: 95% in room air after rest.  After sitting for some time and speaking with Juliann Pulse his blood pressure was rechecked 140/90 and pulse 72.  Please advise

## 2016-04-29 NOTE — Telephone Encounter (Signed)
Patient notified and voiced understanding. Also called new script in for 10 mg amlodipine patient requested.

## 2016-05-05 ENCOUNTER — Ambulatory Visit: Payer: Medicare Other

## 2016-05-05 VITALS — BP 142/92 | HR 61

## 2016-05-05 DIAGNOSIS — I10 Essential (primary) hypertension: Secondary | ICD-10-CM

## 2016-05-05 NOTE — Progress Notes (Signed)
Patient was in today getting a BP reading. Pt states the his bottom number is fluctuating. Patient is checking BP everyday.

## 2016-05-07 DIAGNOSIS — I712 Thoracic aortic aneurysm, without rupture: Secondary | ICD-10-CM | POA: Diagnosis not present

## 2016-05-07 DIAGNOSIS — M7989 Other specified soft tissue disorders: Secondary | ICD-10-CM | POA: Diagnosis not present

## 2016-05-07 DIAGNOSIS — I10 Essential (primary) hypertension: Secondary | ICD-10-CM | POA: Diagnosis not present

## 2016-05-07 DIAGNOSIS — I890 Lymphedema, not elsewhere classified: Secondary | ICD-10-CM | POA: Diagnosis not present

## 2016-05-07 DIAGNOSIS — M79609 Pain in unspecified limb: Secondary | ICD-10-CM | POA: Diagnosis not present

## 2016-05-07 DIAGNOSIS — I724 Aneurysm of artery of lower extremity: Secondary | ICD-10-CM | POA: Diagnosis not present

## 2016-05-07 DIAGNOSIS — I499 Cardiac arrhythmia, unspecified: Secondary | ICD-10-CM | POA: Diagnosis not present

## 2016-05-07 DIAGNOSIS — E785 Hyperlipidemia, unspecified: Secondary | ICD-10-CM | POA: Diagnosis not present

## 2016-05-07 NOTE — Progress Notes (Signed)
Patient ID: David Rice, male   DOB: 1946-05-11, 70 y.o.   MRN: [id number] Covering for patient's PCP as it was her half-day when the patient came in. Blood pressure still mildly elevated. Likely needs increase in dosing. We'll send a message to patient's PCP. Patient should continue to follow with PCP for this.  Tommi Rumps, M.D.

## 2016-05-10 ENCOUNTER — Other Ambulatory Visit: Payer: Self-pay | Admitting: Internal Medicine

## 2016-05-10 MED ORDER — LISINOPRIL 10 MG PO TABS: 10.0000 mg | ORAL_TABLET | Freq: Every day | ORAL | Status: DC

## 2016-05-10 NOTE — Progress Notes (Signed)
  I have reviewed the above information and agree with above.   Adding lisinopril 10 mg daily to regimen for BP.  Retur in one week for BP check and BMET    Deborra Medina, MD

## 2016-05-11 ENCOUNTER — Encounter: Payer: Self-pay | Admitting: *Deleted

## 2016-05-11 ENCOUNTER — Telehealth: Payer: Self-pay | Admitting: *Deleted

## 2016-05-11 NOTE — Telephone Encounter (Signed)
-----   Message from Crecencio Mc, MD sent at 05/10/2016  8:16 PM EDT -----   ----- Message -----    From: Leone Haven, MD    Sent: 05/07/2016   8:06 AM      To: Crecencio Mc, MD

## 2016-05-11 NOTE — Telephone Encounter (Signed)
Mychart message sent to patient.

## 2016-05-13 ENCOUNTER — Ambulatory Visit (INDEPENDENT_AMBULATORY_CARE_PROVIDER_SITE_OTHER): Payer: Medicare Other | Admitting: *Deleted

## 2016-05-13 DIAGNOSIS — I4891 Unspecified atrial fibrillation: Secondary | ICD-10-CM

## 2016-05-13 LAB — POCT INR: INR: 1.7

## 2016-05-18 ENCOUNTER — Telehealth: Payer: Self-pay

## 2016-05-18 DIAGNOSIS — E785 Hyperlipidemia, unspecified: Secondary | ICD-10-CM

## 2016-05-18 DIAGNOSIS — R7301 Impaired fasting glucose: Secondary | ICD-10-CM

## 2016-05-18 NOTE — Telephone Encounter (Signed)
Fasting labs ordered

## 2016-05-18 NOTE — Telephone Encounter (Signed)
Pt coming for lab visit 05/20/16. Is a BMET all we need?  Thank you.

## 2016-05-18 NOTE — Telephone Encounter (Signed)
Noted! Thank you

## 2016-05-20 ENCOUNTER — Ambulatory Visit (INDEPENDENT_AMBULATORY_CARE_PROVIDER_SITE_OTHER): Payer: Medicare Other

## 2016-05-20 ENCOUNTER — Other Ambulatory Visit (INDEPENDENT_AMBULATORY_CARE_PROVIDER_SITE_OTHER): Payer: Medicare Other

## 2016-05-20 VITALS — BP 122/78 | HR 63 | Resp 18

## 2016-05-20 DIAGNOSIS — E785 Hyperlipidemia, unspecified: Secondary | ICD-10-CM | POA: Diagnosis not present

## 2016-05-20 DIAGNOSIS — I10 Essential (primary) hypertension: Secondary | ICD-10-CM | POA: Diagnosis not present

## 2016-05-20 DIAGNOSIS — R7301 Impaired fasting glucose: Secondary | ICD-10-CM

## 2016-05-20 LAB — LIPID PANEL
CHOL/HDL RATIO: 3
Cholesterol: 153 mg/dL (ref 0–200)
HDL: 44.4 mg/dL (ref >39.00)
LDL Cholesterol: 84 mg/dL (ref 0–99)
NONHDL: 108.81
Triglycerides: 126 mg/dL (ref 0.0–149.0)
VLDL: 25.2 mg/dL (ref 0.0–40.0)

## 2016-05-20 LAB — COMPREHENSIVE METABOLIC PANEL
ALK PHOS: 78 U/L (ref 39–117)
ALT: 24 U/L (ref 0–53)
AST: 21 U/L (ref 0–37)
Albumin: 4.4 g/dL (ref 3.5–5.2)
BILIRUBIN TOTAL: 0.6 mg/dL (ref 0.2–1.2)
BUN: 21 mg/dL (ref 6–23)
CALCIUM: 9.5 mg/dL (ref 8.4–10.5)
CO2: 28 meq/L (ref 19–32)
CREATININE: 1.25 mg/dL (ref 0.40–1.50)
Chloride: 102 mEq/L (ref 96–112)
GFR: 60.71 mL/min (ref >60.00)
GLUCOSE: 102 mg/dL — AB (ref 70–99)
Potassium: 3.6 mEq/L (ref 3.5–5.1)
Sodium: 136 mEq/L (ref 135–145)
TOTAL PROTEIN: 7.2 g/dL (ref 6.0–8.3)

## 2016-05-20 LAB — HEMOGLOBIN A1C: Hgb A1c MFr Bld: 5.8 % (ref 4.6–6.5)

## 2016-05-20 NOTE — Progress Notes (Addendum)
Patient came in for BP check, see vitals for details. Has been taking the Lisinopril with little issues.  Today was the first day he felt decent.  But he is happy that things are in the right direction now.  Please advise.    I have reviewed the above information and agree with above. Continue current medications  Deborra Medina, MD

## 2016-05-21 ENCOUNTER — Encounter: Payer: Self-pay | Admitting: Internal Medicine

## 2016-05-25 ENCOUNTER — Encounter: Payer: Self-pay | Admitting: Pharmacist

## 2016-05-27 ENCOUNTER — Ambulatory Visit (INDEPENDENT_AMBULATORY_CARE_PROVIDER_SITE_OTHER): Payer: Medicare Other | Admitting: *Deleted

## 2016-05-27 DIAGNOSIS — I4891 Unspecified atrial fibrillation: Secondary | ICD-10-CM

## 2016-05-27 LAB — POCT INR: INR: 2

## 2016-06-10 ENCOUNTER — Other Ambulatory Visit: Payer: Self-pay | Admitting: Internal Medicine

## 2016-06-10 NOTE — Telephone Encounter (Signed)
Last filled 03-30-16 #40. Last INR 2.0 05-27-16

## 2016-06-17 ENCOUNTER — Ambulatory Visit (INDEPENDENT_AMBULATORY_CARE_PROVIDER_SITE_OTHER): Payer: Medicare Other

## 2016-06-17 DIAGNOSIS — I4891 Unspecified atrial fibrillation: Secondary | ICD-10-CM | POA: Diagnosis not present

## 2016-06-17 LAB — POCT INR: INR: 1.6

## 2016-06-17 MED ORDER — WARFARIN SODIUM 5 MG PO TABS: ORAL_TABLET | ORAL | 3 refills | Status: DC

## 2016-07-01 ENCOUNTER — Ambulatory Visit (INDEPENDENT_AMBULATORY_CARE_PROVIDER_SITE_OTHER): Payer: Medicare Other

## 2016-07-01 DIAGNOSIS — I4891 Unspecified atrial fibrillation: Secondary | ICD-10-CM

## 2016-07-01 LAB — POCT INR: INR: 4.1

## 2016-07-13 ENCOUNTER — Other Ambulatory Visit
Admission: RE | Admit: 2016-07-13 | Discharge: 2016-07-13 | Disposition: A | Discharge status: Home / Self Care | Payer: Medicare Other | Source: Ambulatory Visit | Attending: Cardiovascular Disease | Admitting: Cardiovascular Disease

## 2016-07-13 ENCOUNTER — Ambulatory Visit (INDEPENDENT_AMBULATORY_CARE_PROVIDER_SITE_OTHER): Payer: Medicare Other

## 2016-07-13 ENCOUNTER — Encounter: Payer: Self-pay | Admitting: Cardiovascular Disease

## 2016-07-13 ENCOUNTER — Telehealth: Payer: Self-pay | Admitting: Internal Medicine

## 2016-07-13 ENCOUNTER — Other Ambulatory Visit: Payer: Self-pay

## 2016-07-13 VITALS — HR 74 | Resp 16 | Ht 74.0 in | Wt 240.0 lb

## 2016-07-13 DIAGNOSIS — R079 Chest pain, unspecified: Secondary | ICD-10-CM

## 2016-07-13 DIAGNOSIS — I4891 Unspecified atrial fibrillation: Secondary | ICD-10-CM | POA: Diagnosis not present

## 2016-07-13 LAB — TROPONIN I

## 2016-07-13 LAB — POCT INR: INR: 2.6

## 2016-07-13 NOTE — Progress Notes (Unsigned)
tropo

## 2016-07-13 NOTE — Telephone Encounter (Signed)
PATIENT IN OFFICE FOR COUMADIN CHECK C/O CHEST PAIN  Pt c/o of Chest Pain: STAT if CP now or developed within 24 hours  1. Are you having CP right now? Not this minute but major pain @ 2 am and then interim since gets better when takes several breaths   2. Are you experiencing any other symptoms (ex. SOB, nausea, vomiting, sweating)?  Sob on exertion sweating   Worsening LE swelling up to mid calf down to feet hose help but are not comfortable   3. How long have you been experiencing CP?  First episodes once or twice in last week patient thought was skeletal pain from old injury but pain changed to a very painful episode this morning at 2 am.  Episodes and been coming and going since   4. Is your CP continuous or coming and going?  Comes and goes   5. Have you taken Nitroglycerin? No  ?

## 2016-07-13 NOTE — Telephone Encounter (Signed)
Pt in office for coumadin check. See nurse visit notes

## 2016-07-13 NOTE — Patient Instructions (Addendum)
1.) Reason for visit: Coumadin visit/EKG  2.) Name of MD requesting visit: none    3) H&P: Pt takes coumadin for afib and tikosyn for palpiations.    4) ROS related to problem: Pt presents today for coumadin check. He reports 5/10 chest pain which awakened him this morning at 2am. Lasted for 10 minutes. Accompanied by back pain. Denies any other sx. Resolved on its own while patient was taking deep breaths. He continues to have intermittent pain this morning. EKG done in office.   5.) Assessment and plan per MD: EKG reviewed by Dr. Rockey Situ. Suggested f/u visit w/Dr. Caryl Comes tomorrow and troponin at [facility] today.    Reviewed instructions w/pt who is agreeable w/plan. Troponin order given to patient and direction to [facility].  OV scheduled 8/29 @ 11:15am

## 2016-07-13 NOTE — Telephone Encounter (Signed)
Informed patient troponin negative. Pt verbalized understanding and confirmed 8/29 appt.

## 2016-07-14 ENCOUNTER — Encounter: Payer: Self-pay | Admitting: Internal Medicine

## 2016-07-14 ENCOUNTER — Ambulatory Visit (INDEPENDENT_AMBULATORY_CARE_PROVIDER_SITE_OTHER): Payer: Medicare Other | Admitting: Internal Medicine

## 2016-07-14 VITALS — BP 112/74 | HR 125 | Ht 74.0 in | Wt 241.0 lb

## 2016-07-14 DIAGNOSIS — R001 Bradycardia, unspecified: Secondary | ICD-10-CM | POA: Diagnosis not present

## 2016-07-14 DIAGNOSIS — I480 Paroxysmal atrial fibrillation: Secondary | ICD-10-CM

## 2016-07-14 MED ORDER — AMLODIPINE BESYLATE 10 MG PO TABS: ORAL_TABLET | ORAL | Status: DC

## 2016-07-14 NOTE — Progress Notes (Signed)
Patient Care Team: Crecencio Mc, MD as PCP - General (Internal Medicine)   HPI  David Rice is a 70 y.o. male seen in follow-up for atrial fibrillation and PVCs in the context of sinus bradycardia ; he has treated his sleep apnea  Because of ongoing issues with palpitations, he was admitted for the initiation of dofetilide 4/17  He has had markedly less atrial fibrillation; it seems to be prompted mostly by alcohol.   He has a history of atrial fibrillation diagnosed apparently 12/15. Therapies with atenolol and flecainide were not tolerated and he underwent catheter ablation 7/16 [facility] .   This was complicated by a left femoral pseudoaneurysm and he underwent coiling in July of this year.   He underwent catheterization 1/16 [facility]. This apparently demonstrated no obstructive coronary disease.  (per patient). Aortic aneurysm was noted and he was referred here to Dr. Gerilyn Pilgrim for follow-up.  Current recommendations her blood pressure control and lipid management  Records and Results Reviewed stress Myoview report from 2015 at [facility] was normal   Antiarrhythmics Date  flecainide 2016/   propafenone 2016 /   dofetilide 4/17      5/17 K4.1 Cr 1.11  7/17 K3.6 Cr 1.25  LDL 84   Past Medical History:  Diagnosis Date  . AAA (abdominal aortic aneurysm) ([place])   . Aorta aneurysm ([place])    "@ the valve; 4.5cm; [name] is watching it" (02/18/2016)  . Arthritis    "spine, shoulders, legs" (02/18/2016)  . Bowel obstruction ([place])   . Chronic lower back pain   . Diverticulitis   . Essential hypertension   . GERD (gastroesophageal reflux disease)   . Hearing impaired   . Hyperlipidemia   . Hypertension   . On home oxygen therapy    "don't know how much; use it at night" (02/18/2016)  . OSA on CPAP   . Persistent atrial fibrillation [facility])    Status post ablation in July 2016 in [place].  . Thoracic aortic aneurysm without rupture [facility])      Past Surgical History:  Procedure Laterality Date  . CARDIAC CATHETERIZATION  11/2014   [place]  . CARDIAC ELECTROPHYSIOLOGY MAPPING AND ABLATION  05/2015  . COLECTOMY  11/10/2013   "diverticulitis"  . COLON SURGERY    . INGUINAL HERNIA REPAIR Left 1978  . NASAL SINUS SURGERY Left 04/1987  . PERIPHERAL VASCULAR CATHETERIZATION N/A 08/02/2015   Procedure: Abdominal Aortogram w/Lower Extremity;  Surgeon: Katha Cabal, MD;  Location: [place] [facility];  Service: Cardiovascular;  Laterality: N/A;  . PERIPHERAL VASCULAR CATHETERIZATION  08/02/2015   Procedure: Lower Extremity Intervention;  Surgeon: Katha Cabal, MD;  Location: [place] [facility];  Service: Cardiovascular;;  . TEE WITHOUT CARDIOVERSION N/A 02/17/2016   Procedure: TRANSESOPHAGEAL ECHOCARDIOGRAM (TEE);  Surgeon: Sanda Klein, MD;  Location: [place];  Service: Cardiovascular;  Laterality: N/A;  . TONSILLECTOMY  1950  . WISDOM TOOTH EXTRACTION  1979    Current Outpatient Prescriptions  Medication Sig Dispense Refill  . amLODipine (NORVASC) 10 MG tablet Take 1 tablet (10 mg total) by mouth daily. 90 tablet 0  . atorvastatin (LIPITOR) 10 MG tablet Take 1 tablet (10 mg total) by mouth daily. 90 tablet 3  . dofetilide (TIKOSYN) 250 MCG capsule Take 1 capsule (250 mcg total) by mouth 2 (two) times daily. 10 capsule 1  . esomeprazole (NEXIUM) 20 MG capsule Take 20 mg by mouth  daily at 12 noon.    . Lactase (LACTAID PO) Take by mouth. Take  1 capsule twice daily.    Marland Kitchen lisinopril (PRINIVIL,ZESTRIL) 10 MG tablet Take 1 tablet (10 mg total) by mouth daily. 30 tablet 3  . loratadine (CLARITIN) 10 MG tablet Take 10 mg by mouth at bedtime.     . polyethylene glycol (MIRALAX / GLYCOLAX) packet Take 17 g by mouth daily as needed.    . silodosin (RAPAFLO) 8 MG CAPS capsule Take 8 mg by mouth at bedtime.    . traMADol (ULTRAM) 50 MG tablet Take 1 tablet (50 mg total) by mouth every 6 (six) hours as  needed. 90 tablet 2  . warfarin (COUMADIN) 5 MG tablet Take as directed by [facility] 50 tablet 3   No current facility-administered medications for this visit.     Allergies  Allergen Reactions  . Lactose Intolerance (Gi)       Review of Systems negative except from HPI and PMH  Physical Exam BP 112/74 (BP Location: Left Arm, Patient Position: Sitting, Cuff Size: Normal)   Pulse (!) 125   Ht 6\' 2"  (1.88 m)   Wt 241 lb (109.3 kg)   BMI 30.94 kg/m  Well developed and well nourished in no acute distress HENT normal E scleral and icterus clear Neck Supple JVP flat; carotids brisk and full Clear to ausculation Regular rate and rhythm, no murmurs gallops or rub Soft with active bowel sounds No clubbing cyanosis { Edema Alert and oriented, grossly normal motor and sensory function Skin Warm and Dry  ECG demonstrates  atrial fibrillation at 125  Intervals-/07/32     Assessment and  Plan  Atrial fibrillation-paroxysmal  PVCs-frequent  Sinus bradycardia  History of atrial fibrillation ablation  Aortic aneurysm  Obstructive sleep apnea-treated  Hyper/o kalemia  Dyslipidemia  Obesity   He is tolerating his dofetilide; it has been very effective. He relates atrial fibrillation recurrence to alcohol which she uses to mitigate the stress associated with his mother  He describes as a situational depression. I've suggested that he talk with his primary care physician about anxiolytic/antidepressant medications for the interim.  He is on warfarin and tolerating it well. He is not interested in NOACs. This was reiterated today.   Potassium level  most recently was depressed. This has been quite variable and this is discouraging.  His blood pressure has been better with even small amounts of weight loss. We'll decrease his amlodipine from 10--5; it may well help with his edema. A low-dose potassium sparing diuretic can be used if necessary  Per evaluation by Dr.  Gerilyn Pilgrim , LDL target is 70; will increase his atorvastatin from 10--20

## 2016-07-14 NOTE — Patient Instructions (Signed)
Medication Instructions: - Your physician has recommended you make the following change in your medication:  1) Decrease amlodipine to 10 mg 1/2 tablet (5 mg) by mouth once daily  Labwork: - Your physician recommends that you have lab work today: BMET  Procedures/Testing: - none today  Follow-Up: - Your physician wants you to follow-up in: 6 months with Dr. Caryl Comes. You will receive a reminder letter in the mail two months in advance. If you don't receive a letter, please call our office to schedule the follow-up appointment.  Any Additional Special Instructions Will Be Listed Below (If Applicable).     If you need a refill on your cardiac medications before your next appointment, please call your pharmacy.

## 2016-07-15 LAB — BASIC METABOLIC PANEL
BUN/Creatinine Ratio: 14 (ref 10–24)
BUN: 15 mg/dL (ref 8–27)
CALCIUM: 9.4 mg/dL (ref 8.6–10.2)
CHLORIDE: 98 mmol/L (ref 96–106)
CO2: 24 mmol/L (ref 18–29)
Creatinine, Ser: 1.07 mg/dL (ref 0.76–1.27)
GFR calc non Af Amer: 70 mL/min/{1.73_m2} (ref >59)
GFR, EST AFRICAN AMERICAN: 81 mL/min/{1.73_m2} (ref >59)
GLUCOSE: 101 mg/dL — AB (ref 65–99)
POTASSIUM: 4.2 mmol/L (ref 3.5–5.2)
Sodium: 140 mmol/L (ref 134–144)

## 2016-07-16 NOTE — Addendum Note (Signed)
Addended by: Britt Bottom on: 07/16/2016 02:11 PM   Modules accepted: Orders

## 2016-07-21 ENCOUNTER — Telehealth: Payer: Self-pay

## 2016-07-21 ENCOUNTER — Ambulatory Visit (INDEPENDENT_AMBULATORY_CARE_PROVIDER_SITE_OTHER): Payer: Medicare Other | Admitting: Surgical

## 2016-07-21 DIAGNOSIS — Z23 Encounter for immunization: Secondary | ICD-10-CM

## 2016-07-21 NOTE — Telephone Encounter (Signed)
Called pt to discuss lab results. Pt did not answer and I could not leave a message as the mailbox is full and cannot process anymore messages. I will call back later

## 2016-07-28 DIAGNOSIS — N400 Enlarged prostate without lower urinary tract symptoms: Secondary | ICD-10-CM | POA: Diagnosis not present

## 2016-07-28 DIAGNOSIS — N5201 Erectile dysfunction due to arterial insufficiency: Secondary | ICD-10-CM | POA: Diagnosis not present

## 2016-07-29 ENCOUNTER — Ambulatory Visit (INDEPENDENT_AMBULATORY_CARE_PROVIDER_SITE_OTHER): Payer: Medicare Other | Admitting: *Deleted

## 2016-07-29 DIAGNOSIS — I4891 Unspecified atrial fibrillation: Secondary | ICD-10-CM

## 2016-07-29 LAB — POCT INR: INR: 3.4

## 2016-08-12 ENCOUNTER — Ambulatory Visit (INDEPENDENT_AMBULATORY_CARE_PROVIDER_SITE_OTHER): Payer: Medicare Other

## 2016-08-12 ENCOUNTER — Telehealth: Payer: Self-pay

## 2016-08-12 DIAGNOSIS — I4891 Unspecified atrial fibrillation: Secondary | ICD-10-CM | POA: Diagnosis not present

## 2016-08-12 LAB — POCT INR: INR: 3.1

## 2016-08-12 NOTE — Telephone Encounter (Signed)
No contraindications and no preference

## 2016-08-12 NOTE — Telephone Encounter (Signed)
I will forward to Dr Klein for review. 

## 2016-08-12 NOTE — Telephone Encounter (Signed)
Pt states he saw urologist Dr Baruch Gouty, phone [phone] pt requesting rx for ED medication.  Dr Pilar Jarvis wants to check with cardiologist Dr Caryl Comes to make sure there are no contraindications to taking ED medication and which medication is preferred by cardiologist.  Please call and advise.  Thanks

## 2016-08-13 NOTE — Telephone Encounter (Signed)
The patient is aware of Dr. Olin Pia recommendations. I have called Dr. Carlynn Purl office and notified staff. They are requesting these recommendations be faxed to their office. Fax # [phone].  Will fax phone note documentation.

## 2016-08-13 NOTE — Telephone Encounter (Signed)
Copy of phone note faxed to Dr. Pilar Jarvis at [phone]. Confirmation received.

## 2016-08-18 ENCOUNTER — Other Ambulatory Visit: Payer: Self-pay | Admitting: Internal Medicine

## 2016-08-18 NOTE — Telephone Encounter (Signed)
Please advise if this can be refilled, Dr.Klein changed dose at last cardiology visit to a 1/2 tablet.  You had prescribed a full tablet at last refill? thanks

## 2016-08-19 ENCOUNTER — Other Ambulatory Visit: Payer: Self-pay

## 2016-08-19 ENCOUNTER — Other Ambulatory Visit: Payer: Self-pay | Admitting: Internal Medicine

## 2016-08-21 ENCOUNTER — Ambulatory Visit: Payer: Medicare Other | Admitting: Internal Medicine

## 2016-09-02 ENCOUNTER — Ambulatory Visit (INDEPENDENT_AMBULATORY_CARE_PROVIDER_SITE_OTHER): Payer: Medicare Other | Admitting: *Deleted

## 2016-09-02 ENCOUNTER — Other Ambulatory Visit: Payer: Self-pay | Admitting: *Deleted

## 2016-09-02 DIAGNOSIS — I7121 Aneurysm of the ascending aorta, without rupture: Secondary | ICD-10-CM

## 2016-09-02 DIAGNOSIS — I4891 Unspecified atrial fibrillation: Secondary | ICD-10-CM

## 2016-09-02 DIAGNOSIS — I712 Thoracic aortic aneurysm, without rupture: Secondary | ICD-10-CM

## 2016-09-02 LAB — POCT INR: INR: 2.1

## 2016-09-08 ENCOUNTER — Other Ambulatory Visit: Payer: Self-pay | Admitting: Internal Medicine

## 2016-09-16 ENCOUNTER — Encounter: Payer: Self-pay | Admitting: Internal Medicine

## 2016-09-23 ENCOUNTER — Ambulatory Visit (INDEPENDENT_AMBULATORY_CARE_PROVIDER_SITE_OTHER): Payer: Medicare Other

## 2016-09-23 DIAGNOSIS — I4891 Unspecified atrial fibrillation: Secondary | ICD-10-CM

## 2016-09-23 LAB — POCT INR: INR: 4.1

## 2016-09-24 ENCOUNTER — Ambulatory Visit: Payer: Medicare Other | Admitting: Internal Medicine

## 2016-09-25 ENCOUNTER — Ambulatory Visit (INDEPENDENT_AMBULATORY_CARE_PROVIDER_SITE_OTHER): Payer: Medicare Other | Admitting: Internal Medicine

## 2016-09-25 ENCOUNTER — Encounter: Payer: Self-pay | Admitting: Internal Medicine

## 2016-09-25 VITALS — BP 124/71 | HR 88 | Temp 98.3°F | Ht 74.0 in | Wt 231.6 lb

## 2016-09-25 DIAGNOSIS — E6609 Other obesity due to excess calories: Secondary | ICD-10-CM | POA: Diagnosis not present

## 2016-09-25 DIAGNOSIS — G4733 Obstructive sleep apnea (adult) (pediatric): Secondary | ICD-10-CM | POA: Diagnosis not present

## 2016-09-25 DIAGNOSIS — R5383 Other fatigue: Secondary | ICD-10-CM | POA: Diagnosis not present

## 2016-09-25 DIAGNOSIS — I10 Essential (primary) hypertension: Secondary | ICD-10-CM | POA: Diagnosis not present

## 2016-09-25 DIAGNOSIS — F419 Anxiety disorder, unspecified: Secondary | ICD-10-CM

## 2016-09-25 DIAGNOSIS — Z9989 Dependence on other enabling machines and devices: Secondary | ICD-10-CM | POA: Diagnosis not present

## 2016-09-25 DIAGNOSIS — F5105 Insomnia due to other mental disorder: Secondary | ICD-10-CM

## 2016-09-25 DIAGNOSIS — E66811 Obesity, class 1: Secondary | ICD-10-CM

## 2016-09-25 DIAGNOSIS — Z6830 Body mass index (BMI) 30.0-30.9, adult: Secondary | ICD-10-CM

## 2016-09-25 MED ORDER — TRAZODONE HCL 50 MG PO TABS: ORAL_TABLET | ORAL | 2 refills | Status: DC

## 2016-09-25 NOTE — Progress Notes (Signed)
Subjective:  Patient ID: David Rice, male    DOB: August 29, 1946  Age: 70 y.o. MRN: [id number]  CC: The primary encounter diagnosis was OSA on CPAP. Diagnoses of Fatigue, unspecified type, Class 1 obesity due to excess calories without serious comorbidity with body mass index (BMI) of 30.0 to 30.9 in adult, Essential hypertension, and Insomnia secondary to anxiety were also pertinent to this visit.  HPI David Rice presents for follow up on multiple chronic issues:  Recently recovering from"the flu" (self diagnosed after contact with confirmed flu ).   Sore throat, no sinsus  No cough lots of  malaise,  Body aches 8 days.   Not short of breath,    Wearing CPAP but having trouble going to sleep and getting up .  Stays up to 3:00 am talking to friends on  facebook.   Needs a new supplier for CPAP machine [employer]   Situational depression, family matters,  89 yr old mother depleting her finances supporting his adult brother  Who he feels is taking advantage of her.   However, Has a new romance with an old friend. Has become motivated to lose 10 lbs in 3 months, by eating less.    COLONOSCOPY LAST ONE 2014 ,  PARTIAL COLECTOMY  FOR TICS NEEDS HEP C SCREENING .  HAD AN HIV TEST YEARS AGO    Outpatient Medications Prior to Visit  Medication Sig Dispense Refill  . amLODipine (NORVASC) 5 MG tablet Take 1 tablet (5 mg total) by mouth daily. 90 tablet 1  . atorvastatin (LIPITOR) 10 MG tablet Take 1 tablet (10 mg total) by mouth daily. 90 tablet 3  . diphenhydrAMINE (BENADRYL) 25 mg capsule Take 25 mg by mouth every 6 (six) hours as needed. Pt take 3 tabs at night    . dofetilide (TIKOSYN) 250 MCG capsule Take 1 capsule (250 mcg total) by mouth 2 (two) times daily. 10 capsule 1  . esomeprazole (NEXIUM) 20 MG capsule Take 20 mg by mouth daily at 12 noon.    . Lactase (LACTAID PO) Take by mouth. Take  1 capsule twice daily.    Marland Kitchen lisinopril (PRINIVIL,ZESTRIL) 10 MG tablet Take 1 tablet (10 mg  total) by mouth daily. 30 tablet 2  . loratadine (CLARITIN) 10 MG tablet Take 10 mg by mouth at bedtime.     . polyethylene glycol (MIRALAX / GLYCOLAX) packet Take 17 g by mouth daily as needed.    . silodosin (RAPAFLO) 8 MG CAPS capsule Take 8 mg by mouth at bedtime.    . traMADol (ULTRAM) 50 MG tablet Take 1 tablet (50 mg total) by mouth every 6 (six) hours as needed. 90 tablet 2  . warfarin (COUMADIN) 5 MG tablet Take as directed by [facility] 50 tablet 3   No facility-administered medications prior to visit.     Review of Systems;  Patient denies headache, fevers, malaise, unintentional weight loss, skin rash, eye pain, sinus congestion and sinus pain, sore throat, dysphagia,  hemoptysis , cough, dyspnea, wheezing, chest pain, palpitations, orthopnea, edema, abdominal pain, nausea, melena, diarrhea, constipation, flank pain, dysuria, hematuria, urinary  Frequency, nocturia, numbness, tingling, seizures,  Focal weakness, Loss of consciousness,  Tremor,  depression,  and suicidal ideation.      Objective:  BP 124/71   Pulse 88   Temp 98.3 F (36.8 C) (Oral)   Ht 6\' 2"  (1.88 m)   Wt 231 lb 9.6 oz (105.1 kg)   SpO2 97%   BMI  29.74 kg/m   BP Readings from Last 3 Encounters:  09/25/16 124/71  07/14/16 112/74  05/20/16 122/78    Wt Readings from Last 3 Encounters:  09/25/16 231 lb 9.6 oz (105.1 kg)  07/14/16 241 lb (109.3 kg)  07/13/16 240 lb (108.9 kg)    General appearance: alert, cooperative and appears stated age Ears: normal TM's and external ear canals both ears Throat: lips, mucosa, and tongue normal; teeth and gums normal Neck: no adenopathy, no carotid bruit, supple, symmetrical, trachea midline and thyroid not enlarged, symmetric, no tenderness/mass/nodules Back: symmetric, no curvature. ROM normal. No CVA tenderness. Lungs: clear to auscultation bilaterally Heart: regular rate and rhythm, S1, S2 normal, no murmur, click, rub or gallop Abdomen: soft,  non-tender; bowel sounds normal; no masses,  no organomegaly Pulses: 2+ and symmetric Skin: Skin color, texture, turgor normal. No rashes or lesions Lymph nodes: Cervical, supraclavicular, and axillary nodes normal.  Lab Results  Component Value Date   HGBA1C 5.8 05/20/2016    Lab Results  Component Value Date   CREATININE 1.07 07/14/2016   CREATININE 1.25 05/20/2016   CREATININE 1.11 03/30/2016    Lab Results  Component Value Date   WBC 5.2 02/18/2016   HGB 14.9 02/18/2016   HCT 45.9 02/18/2016   PLT 128 (L) 02/18/2016   GLUCOSE 101 (H) 07/14/2016   CHOL 153 05/20/2016   TRIG 126.0 05/20/2016   HDL 44.40 05/20/2016   LDLCALC 84 05/20/2016   ALT 24 05/20/2016   AST 21 05/20/2016   NA 140 07/14/2016   K 4.2 07/14/2016   CL 98 07/14/2016   CREATININE 1.07 07/14/2016   BUN 15 07/14/2016   CO2 24 07/14/2016   TSH 1.38 12/20/2015   INR 4.1 09/23/2016   HGBA1C 5.8 05/20/2016    No results found.  Assessment & Plan:   Problem List Items Addressed This Visit    Essential hypertension    Well controlled on current regimen. Renal function stable, no changes today.  Lab Results  Component Value Date   CREATININE 1.07 07/14/2016   Lab Results  Component Value Date   NA 140 07/14/2016   K 4.2 07/14/2016   CL 98 07/14/2016   CO2 24 07/14/2016         OSA on CPAP - Primary    Diagnosed by prior sleep study. Patient is using CPAP every night a minimum of 6 hours per night and notes improved daytime wakefulness and decreased fatigue       Relevant Orders   For home use only DME continuous positive airway pressure (CPAP)   Obesity    I have congratulated him in reducing His BMI and encouraged  Continued weight loss with goal of 10% of body weigh over the next 6 months using a low glycemic index diet and regular exercise a minimum of 5 days per week.        Insomnia secondary to anxiety    Aggravated by family conflict with mother. Trial of trazodone.       Relevant Medications   traZODone (DESYREL) 50 MG tablet    Other Visit Diagnoses    Fatigue, unspecified type       Relevant Orders   Hepatitis C antibody   Comprehensive metabolic panel      I am having David Rice start on traZODone. I am also having him maintain his Lactase (LACTAID PO), loratadine, esomeprazole, traMADol, atorvastatin, silodosin, polyethylene glycol, dofetilide, warfarin, diphenhydrAMINE, amLODipine, and lisinopril.  Meds ordered this encounter  Medications  . traZODone (DESYREL) 50 MG tablet    Sig: 1/2 to 1 tablet by mouth One hour prior to bedtime    Dispense:  30 tablet    Refill:  2    There are no discontinued medications.  Follow-up: Return in about 6 months (around 03/25/2017) for CPE.   Crecencio Mc, MD

## 2016-09-25 NOTE — Patient Instructions (Addendum)
I RECOMMEND a Trial of trazodone to help you rest .  Take it 1 hour before bedtime   I recommend turning off your lap top, cell phone etc 2 hours before you want to be asleep   See you in 6 months .   I DO recommend the ShingRix (shingles ) vaccine when it is available.  Check back in a month

## 2016-09-25 NOTE — Progress Notes (Signed)
Pre visit review using our clinic review tool, if applicable. No additional management support is needed unless otherwise documented below in the visit note. 

## 2016-09-27 DIAGNOSIS — F419 Anxiety disorder, unspecified: Secondary | ICD-10-CM | POA: Insufficient documentation

## 2016-09-27 DIAGNOSIS — F5105 Insomnia due to other mental disorder: Secondary | ICD-10-CM

## 2016-09-27 NOTE — Assessment & Plan Note (Signed)
Diagnosed by prior sleep study. Patient is using CPAP every night a minimum of 6 hours per night and notes improved daytime wakefulness and decreased fatigue  

## 2016-09-27 NOTE — Assessment & Plan Note (Signed)
Well controlled on current regimen. Renal function stable, no changes today.  Lab Results  Component Value Date   CREATININE 1.07 07/14/2016   Lab Results  Component Value Date   NA 140 07/14/2016   K 4.2 07/14/2016   CL 98 07/14/2016   CO2 24 07/14/2016

## 2016-09-27 NOTE — Assessment & Plan Note (Signed)
Aggravated by family conflict with mother. Trial of trazodone.

## 2016-09-27 NOTE — Assessment & Plan Note (Signed)
I have congratulated him in reducing His BMI and encouraged  Continued weight loss with goal of 10% of body weigh over the next 6 months using a low glycemic index diet and regular exercise a minimum of 5 days per week.

## 2016-10-07 ENCOUNTER — Ambulatory Visit (INDEPENDENT_AMBULATORY_CARE_PROVIDER_SITE_OTHER): Payer: Medicare Other

## 2016-10-07 DIAGNOSIS — I4891 Unspecified atrial fibrillation: Secondary | ICD-10-CM

## 2016-10-07 LAB — POCT INR: INR: 3.1

## 2016-10-15 ENCOUNTER — Encounter: Payer: Self-pay | Admitting: Cardiothoracic Surgery

## 2016-10-15 ENCOUNTER — Ambulatory Visit (INDEPENDENT_AMBULATORY_CARE_PROVIDER_SITE_OTHER): Payer: Medicare Other | Admitting: Cardiothoracic Surgery

## 2016-10-15 ENCOUNTER — Ambulatory Visit
Admission: RE | Admit: 2016-10-15 | Discharge: 2016-10-15 | Disposition: A | Discharge status: Home / Self Care | Payer: Medicare Other | Source: Ambulatory Visit | Attending: Cardiothoracic Surgery | Admitting: Cardiothoracic Surgery

## 2016-10-15 VITALS — BP 134/87 | HR 74 | Resp 20 | Ht 74.0 in | Wt 232.0 lb

## 2016-10-15 DIAGNOSIS — I712 Thoracic aortic aneurysm, without rupture, unspecified: Secondary | ICD-10-CM

## 2016-10-15 DIAGNOSIS — I7121 Aneurysm of the ascending aorta, without rupture: Secondary | ICD-10-CM

## 2016-10-15 MED ORDER — IOPAMIDOL (ISOVUE-370) INJECTION 76%
75.0000 mL | Freq: Once | INTRAVENOUS | Status: AC | PRN
  Administered 2016-10-15: 75 mL via INTRAVENOUS

## 2016-10-15 NOTE — Progress Notes (Signed)
[address]       [name] [address]             [id number]                    David Rice [facility] [id number] Date of Birth: Sep 09, 1946  Referring: David Sprang, MD Primary Care: Crecencio Mc, MD  Chief Complaint:    Chief Complaint  Patient presents with  . Thoracic Aortic Aneurysm    8 month f/u with CTA Chest    History of Present Illness:    David Rice 70 y.o. male is seen in the office  today for Follow-up on  dilated ascending aorta first noted at cardiac catheterization done at [facility] in the summer of 2016 during the evaluation for atrial fibrillation and cardiac catheterization.   At the time of catheterization he was noted to have a dilated ascending aorta, and subsequently had a CTA of the chest abdomen and pelvis. 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 fib ablation procedure previously done. This pseudoaneurysm was occluded endovascularly at [facility] in September 2016.   Echo cardiogram basically showed a normal heart, the CT scan also found an incidental 4.7 cm ascending aortic dilatation.  The patient has no stigmata of Marfan's or other connective tissue disorder, he has no family history of early sudden death or known dissection or descending aneurysm. The patient's father died at age 82 with lung cancer was a long-term smoker, his mother is still alive and doing well at age 34, he has one brother with mental disorder but no known aneurysm he has 2 sons that are alive and healthy and well.His maternal grandfather died of a ruptured abdominal aneurysm  Since last seen the patient notes that he did taken to [place] the advice to control blood pressure, he has gone on a weight reduction diet intentionally dropping his weight from 246-226 and notes that he feels much better, his goal is to get to a weight of 205   Current  Activity/ Functional Status:  Patient is independent with mobility/ambulation, transfers, ADL's, IADL's.   Zubrod Score: At the time of surgery this patient's most appropriate activity status/ level should be described as: [x]     0    Normal activity, no symptoms []     1    Restricted in physical strenuous activity but ambulatory, able to do out light work []     2    Ambulatory and capable of self care, unable to do work activities, up and about               >50 % of waking hours                              []     3    Only limited self care, in bed greater than 50% of waking hours []     4    Completely disabled, no self care, confined to bed or chair []     5    Moribund   Past Medical History:  Diagnosis Date  . AAA (abdominal aortic aneurysm) ([place])   . Aorta aneurysm ([place])    "@ the valve; 4.5cm; [name] is watching it" (02/18/2016)  . Arthritis    "spine, shoulders, legs" (02/18/2016)  .  Bowel obstruction   . Chronic lower back pain   . Diverticulitis   . Essential hypertension   . GERD (gastroesophageal reflux disease)   . Hearing impaired   . Hyperlipidemia   . Hypertension   . On home oxygen therapy    "don't know how much; use it at night" (02/18/2016)  . OSA on CPAP   . Persistent atrial fibrillation [facility])    Status post ablation in July 2016 in [place].  . Thoracic aortic aneurysm without rupture [facility])     Past Surgical History:  Procedure Laterality Date  . CARDIAC CATHETERIZATION  11/2014   [place]  . CARDIAC ELECTROPHYSIOLOGY MAPPING AND ABLATION  05/2015  . COLECTOMY  11/10/2013   "diverticulitis"  . COLON SURGERY    . INGUINAL HERNIA REPAIR Left 1978  . NASAL SINUS SURGERY Left 04/1987  . PERIPHERAL VASCULAR CATHETERIZATION N/A 08/02/2015   Procedure: Abdominal Aortogram w/Lower Extremity;  Surgeon: Katha Cabal, MD;  Location: [place] [facility];  Service: Cardiovascular;  Laterality: N/A;  . PERIPHERAL VASCULAR  CATHETERIZATION  08/02/2015   Procedure: Lower Extremity Intervention;  Surgeon: Katha Cabal, MD;  Location: [place] [facility];  Service: Cardiovascular;;  . TEE WITHOUT CARDIOVERSION N/A 02/17/2016   Procedure: TRANSESOPHAGEAL ECHOCARDIOGRAM (TEE);  Surgeon: Sanda Klein, MD;  Location: [place];  Service: Cardiovascular;  Laterality: N/A;  . TONSILLECTOMY  1950  . WISDOM TOOTH EXTRACTION  1979    Family History  Problem Relation Age of Onset  . Hypertension Mother     Living 92  . Lung cancer Father     Deceased, 20  . Hypertension Brother   . Healthy Son   . Healthy Daughter     Social History  Patient's wife died at age 14, he raised 2 small children on his own for the next 19 years Social History  . Marital Status: Single    Spouse Name: N/A  . Number of Children: N/A  . Years of Education: N/A   Occupational History  .  patient retired as [employer]    Social History Main Topics  . Smoking status: Never Smoker   . Smokeless tobacco: Not on file  . Alcohol Use: 1.2 oz/week    2 Glasses of wine per week  . Drug Use: No  . Sexual Activity: Not on file   Other Topics Concern  . Not on file   Social History Narrative    History  Smoking Status  . Never Smoker  Smokeless Tobacco  . Never Used    History  Alcohol Use  . 3.6 oz/week  . 6 Glasses of wine per week     Allergies  Allergen Reactions  . Lactose Intolerance (Gi)     Current Outpatient Prescriptions  Medication Sig Dispense Refill  . amLODipine (NORVASC) 5 MG tablet Take 1 tablet (5 mg total) by mouth daily. 90 tablet 1  . atorvastatin (LIPITOR) 10 MG tablet Take 1 tablet (10 mg total) by mouth daily. 90 tablet 3  . dofetilide (TIKOSYN) 250 MCG capsule Take 1 capsule (250 mcg total) by mouth 2 (two) times daily. 10 capsule 1  . esomeprazole (NEXIUM) 20 MG capsule Take 20 mg by mouth every 3 (three) days. Only takes this medication twice a week    . Lactase (LACTAID PO) Take by mouth.  Take  1 capsule twice daily.    Marland Kitchen lisinopril (PRINIVIL,ZESTRIL) 10 MG tablet Take 1 tablet (10 mg total) by mouth daily.  30 tablet 2  . loratadine (CLARITIN) 10 MG tablet Take 10 mg by mouth at bedtime.     . polyethylene glycol (MIRALAX / GLYCOLAX) packet Take 17 g by mouth daily as needed.    . silodosin (RAPAFLO) 8 MG CAPS capsule Take 8 mg by mouth at bedtime as needed.     . traMADol (ULTRAM) 50 MG tablet Take 1 tablet (50 mg total) by mouth every 6 (six) hours as needed. 90 tablet 2  . traZODone (DESYREL) 50 MG tablet 1/2 to 1 tablet by mouth One hour prior to bedtime 30 tablet 2  . warfarin (COUMADIN) 5 MG tablet Take as directed by [facility] 50 tablet 3   No current facility-administered medications for this visit.       Review of Systems:     Cardiac Review of Systems: Y or N  Chest Pain [  n  ]  Resting SOB [ n  ] Exertional SOB  Blue.Reese  ]  Orthopnea n[  ]   Pedal Edema [ y  ]    Palpitations Blue.Reese  ] Syncope  [ n ]   Presyncope [n   ]  General Review of Systems: [Y] = yes [  ]=no Constitional: recent weight change [  ];  Wt loss over the last 3 months [   ] anorexia [  ]; fatigue [ x ]; nausea [  ]; night sweats [  ]; fever [  ]; or chills [  ];          Dental: poor dentition[  ]; Last Dentist visit:   Eye : blurred vision [  ]; diplopia [   ]; vision changes [  ];  Amaurosis fugax[  ]; Resp: cough [  ];  wheezing[  ];  hemoptysis[  ]; shortness of breath[  ]; paroxysmal nocturnal dyspnea[  ]; dyspnea on exertion[  ]; or orthopnea[  ];  GI:  gallstones[  ], vomiting[  ];  dysphagia[  ]; melena[  ];  hematochezia [  ]; heartburn[  ];   Hx of  Colonoscopy[  ]; GU: kidney stones [  ]; hematuria[  ];   dysuria [  ];  nocturia[  ];  history of     obstruction [  ]; urinary frequency [  ]             Skin: rash, swelling[  ];, hair loss[  ];  peripheral edema[  ];  or itching[  ]; Musculosketetal: myalgias[  ];  joint swelling[  ];  joint erythema[  ];  joint pain[  ];  back pain[   ];  Heme/Lymph: bruising[  ];  bleeding[  ];  anemia[  ];  Neuro: TIA[  ];  headaches[  ];  stroke[  ];  vertigo[  ];  seizures[  ];   paresthesias[  ];  difficulty walking[n  ]; with the medication for atrial fibrillation the patient had significant problems with tremor to the point he was unable to use hand tools or power this has improved lately  Psych:depression[  ]; anxiety[  ];  Endocrine: diabetes[  ];  thyroid dysfunction[  ];  Immunizations: Flu up to date [  ]; Pneumococcal up to date [  ];  Other:  Physical Exam: BP 134/87   Pulse 74   Resp 20   Ht 6\' 2"  (1.88 m)   Wt 232 lb (105.2 kg)   SpO2 98% Comment: RA  BMI 29.79 kg/m  PHYSICAL EXAMINATION: General appearance: alert, cooperative, appears stated age and no distress Head: Normocephalic, without obvious abnormality, atraumatic Neck: no adenopathy, no carotid bruit, no JVD, supple, symmetrical, trachea midline and thyroid not enlarged, symmetric, no tenderness/mass/nodules Lymph nodes: Cervical, supraclavicular, and axillary nodes normal. Resp: clear to auscultation bilaterally Back: symmetric, no curvature. ROM normal. No CVA tenderness. Cardio: regular rate and rhythm, S1, S2 normal, no murmur, click, rub or gallop GI: soft, non-tender; bowel sounds normal; no masses,  no organomegaly Extremities: extremities normal, atraumatic, no cyanosis or edema and Homans sign is negative, no sign of DVT Neurologic: Grossly normal The tremor in the hands  No palpable abdominal aneurysm DP and PT pulses are present bilaterally  Diagnostic Studies & Laboratory data:     Recent Radiology Findings: Ct Angio Chest Aorta W &/or Wo Contrast  Result Date: 10/15/2016 CLINICAL DATA:  Followup aneurysm. EXAM: CT ANGIOGRAPHY CHEST WITH CONTRAST TECHNIQUE: Multidetector CT imaging of the chest was performed using the standard protocol during bolus administration of intravenous contrast. Multiplanar CT image reconstructions and MIPs were  obtained to evaluate the vascular anatomy. CONTRAST:  75 cc Isovue Creatinine was obtained on site at [facility] at [address] Results: Creatinine 1.1 mg/dL. COMPARISON:  CT 02/13/2016 FINDINGS: Cardiovascular: Using the same axial measuring convention as comparison CT ascending thoracic aorta measures 47 by 48 mm compared to 49 x 47 mm for no interval change. On sagittal imaging aorta measures 45 mm (image 87 of series 6) compared with 45 mm for no change Great vessels are normal. Minimal intimal calcification. The ascending thoracic aorta is normal caliber. Rare reduced are normal. No pericardial fluid. Mediastinum/Nodes: No axillary supraclavicular adenopathy. No mediastinal hilar adenopathy. No pericardial fluid. Lungs/Pleura: No suspicious pulmonary nodularity. Airways are normal. Upper Abdomen: Limited view of the liver, kidneys, pancreas are unremarkable. Normal adrenal glands. Musculoskeletal: Degenerate spurring of the spine. Review of the MIP images confirms the above findings. IMPRESSION: 1. No change in diameter of fusiform ascending aorta aneurysm. 2. No acute cardiopulmonary findings. Electronically Signed   By: Suzy Bouchard M.D.   On: 10/15/2016 11:09    Ct Angio Chest W/cm &/or Wo Cm  02/13/2016  CLINICAL DATA:  Followup thoracic aortic aneurysm. Chest pain and shortness of breath. EXAM: CT ANGIOGRAPHY CHEST WITH CONTRAST TECHNIQUE: Multidetector CT imaging of the chest was performed using the standard protocol during bolus administration of intravenous contrast. Multiplanar CT image reconstructions and MIPs were obtained to evaluate the vascular anatomy. CONTRAST:  75 cc Isovue 370 COMPARISON:  07/28/2015 FINDINGS: Mediastinum/Nodes: No chest wall mass, supraclavicular or axillary lymphadenopathy. Small scattered lymph nodes. The thyroid gland is grossly normal. Mild stable cardiac enlargement. No pericardial effusion. Stable fusiform aneurysm on the attachment of the  ascending aorta with maximal measurement of 48 x 47 mm. No dissection. The pulmonary arteries appear normal. No mediastinal or hilar mass or adenopathy. Stable small scattered lymph nodes. Esophagus is grossly normal. Lungs/Pleura: No acute pulmonary findings. Dependent subpleural atelectasis is noted. No worrisome pulmonary lesions. No bronchiectasis or interstitial lung disease. Upper abdomen: No significant findings. Musculoskeletal: No significant findings. Moderate stable degenerative changes involving the thoracic spine. Review of the MIP images confirms the above findings. IMPRESSION: 1. Stable fusiform aneurysmal dilatation of the ascending aorta with maximal measurement of 48 x 47 mm. No dissection. Ascending thoracic aortic aneurysm. Recommend semi-annual imaging followup by CTA or MRA and referral to cardiothoracic surgery if not already obtained. This recommendation follows 2010 ACCF/AHA/AATS/ACR/ASA/SCA/SCAI/SIR/STS/SVM Guidelines for  the Diagnosis and Management of Patients With Thoracic Aortic Disease. Circulation. 2010; 121: [id number] 2. Stable mild cardiac enlargement. 3. No acute or significant pulmonary findings. Electronically Signed   By: Marijo Sanes M.D.   On: 02/13/2016 10:53       CLINICAL DATA: Acute left-sided chest and back pain. History of aortic aneurysm.  EXAM: CT ANGIOGRAPHY CHEST, ABDOMEN AND PELVIS  TECHNIQUE: Multidetector CT imaging through the chest, abdomen and pelvis was performed using the standard protocol during bolus administration of intravenous contrast. Multiplanar reconstructed images and MIPs were obtained and reviewed to evaluate the vascular anatomy.  CONTRAST: 151mL OMNIPAQUE IOHEXOL 350 MG/ML SOLN  COMPARISON: CT scan of June 25, 2013.  FINDINGS: CTA CHEST FINDINGS  No pneumothorax or significant pleural effusion is noted. No acute pulmonary disease is noted. There is no evidence of thoracic aortic dissection. Great vessels are  widely patent. 4.8 cm ascending thoracic aortic aneurysm is noted. No mediastinal mass or adenopathy is noted. Pulmonary arteries appear normal. No significant osseous abnormality is noted in the chest.  Review of the MIP images confirms the above findings.  CTA ABDOMEN AND PELVIS FINDINGS  No gallstones are noted. No definite abnormality seen involving the liver, spleen or pancreas. Adrenal glands and kidneys appear normal. No hydronephrosis or renal obstruction is noted. No renal or ureteral calculi are noted. The appendix appears normal. There is no evidence of bowel obstruction. Atherosclerosis of abdominal aorta is noted without dissection. The celiac, superior mesenteric and renal arteries are widely patent. 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 3.1 cm infrarenal abdominal aortic aneurysm is noted. Iliac arteries are widely patent without significant stenosis. Moderate prostatic enlargement is noted. Urinary bladder appears normal. 4.1 cm pseudo aneurysm with a large amount of mural thrombus is seen arising from a proximal branch of the proximal left superficial femoral artery. Multilevel degenerative disc disease is noted in the lumbar spine.  Review of the MIP images confirms the above findings.  IMPRESSION: No evidence of thoracic aortic dissection.  4.8 cm ascending thoracic aortic aneurysm is noted. Ascending thoracic aortic aneurysm. Recommend semi-annual imaging followup by CTA or MRA and referral to cardiothoracic surgery if not already obtained. This recommendation follows 2010 ACCF/AHA/AATS/ACR/ASA/SCA/SCAI/SIR/STS/SVM Guidelines for the Diagnosis and Management of Patients With Thoracic Aortic Disease. Circulation. 2010; [id number].  No evidence of abdominal aortic dissection. 3.1 cm infrarenal abdominal aortic aneurysm is noted.  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 There appears to be early filling of the left common femoral vein with contrast, most likely due to this pseudoaneurysm. Consultation with vascular surgery is recommended. Critical Value/emergent results were called by telephone at the time of interpretation on 07/28/2015 at 6:11 pm to Dr. Lisa Roca , who verbally acknowledged these results.   Electronically Signed  By: Marijo Conception, M.D.  On: 07/28/2015 18:13  I have independently reviewed the above radiology studies  and reviewed the findings with the patient.  ECHO: 08/15/2015 Study Conclusions  - Left ventricle: The cavity size was normal. There was mild concentric hypertrophy. Systolic function was normal. The estimated ejection fraction was in the range of 60% to 65%. Wall motion was normal; there were no regional wall motion abnormalities. Left ventricular diastolic function parameters were normal. - Aortic valve: There was trivial regurgitation. - Mitral valve: There was mild regurgitation. - Left atrium: The atrium was normal in size. - Right ventricle: Systolic function was normal. -  Pulmonary arteries: Systolic pressure was within the normal range.  Impressions:  - Normal study.   Recent Lab Findings: Lab Results  Component Value Date   WBC 5.2 02/18/2016   HGB 14.9 02/18/2016   HCT 45.9 02/18/2016   PLT 128 (L) 02/18/2016   GLUCOSE 101 (H) 07/14/2016   CHOL 153 05/20/2016   TRIG 126.0 05/20/2016   HDL 44.40 05/20/2016   LDLCALC 84 05/20/2016   ALT 24 05/20/2016   AST 21 05/20/2016   NA 140 07/14/2016   K 4.2 07/14/2016   CL 98 07/14/2016   CREATININE 1.07 07/14/2016   BUN 15 07/14/2016   CO2 24 07/14/2016   TSH 1.38 12/20/2015   INR 3.1 10/07/2016   HGBA1C 5.8 05/20/2016   Aortic Size Index=    4.7     /Body surface area is 2.34 meters squared. =2.0 < 2.75 cm/m2      4% risk per year 2.75 to 4.25          8% risk per year > 4.25 cm/m2    20% risk per  year     Assessment / Plan:   Incidental finding of dilated ascending aorta without evidence of aortic insufficiency or bicuspid valve, I have again  reviewed with patient the risks signs and symptoms of aortic dissection. With the current size surgical intervention is not indicated but close follow-up is needed, good control of blood pressure is recommended. CT scan done today shows no change from 8 months ago or from September 2016.  I discussed with the patient not engage in excessive lifting such as competitive weightlifting he notes that he is heated this and avoided strenuous lifting,   According to the 2010 ACC/AHA guidelines, we recommend patients with thoracic aortic disease to maintain a LDL of less than 70 and a HDL of greater than 50.   We'll plan to see the patient back for follow-up CTA of chest  scan in 8 months   Patient has follow-up with cardiology in several months, he will discuss with Dr. Caryl Comes timing of follow-up echoes and sleep apnea evaluation.   Grace Isaac MD      [address] [place] [address] Office [phone]   [place] [phone]  10/15/2016 11:47 AM

## 2016-10-28 ENCOUNTER — Ambulatory Visit (INDEPENDENT_AMBULATORY_CARE_PROVIDER_SITE_OTHER): Payer: Medicare Other

## 2016-10-28 DIAGNOSIS — I4891 Unspecified atrial fibrillation: Secondary | ICD-10-CM | POA: Diagnosis not present

## 2016-10-28 LAB — POCT INR: INR: 2.1

## 2016-11-10 ENCOUNTER — Encounter: Payer: Self-pay | Admitting: Internal Medicine

## 2016-11-19 ENCOUNTER — Institutional Professional Consult (permissible substitution): Payer: Medicare Other | Admitting: Pulmonary Disease

## 2016-11-24 ENCOUNTER — Other Ambulatory Visit: Payer: Self-pay | Admitting: Internal Medicine

## 2016-11-25 ENCOUNTER — Ambulatory Visit (INDEPENDENT_AMBULATORY_CARE_PROVIDER_SITE_OTHER): Payer: Medicare Other

## 2016-11-25 DIAGNOSIS — I4891 Unspecified atrial fibrillation: Secondary | ICD-10-CM

## 2016-11-25 LAB — POCT INR: INR: 1.8

## 2016-11-25 MED ORDER — WARFARIN SODIUM 5 MG PO TABS: ORAL_TABLET | ORAL | 3 refills | Status: DC

## 2016-12-04 ENCOUNTER — Encounter: Payer: Self-pay | Admitting: Internal Medicine

## 2016-12-04 ENCOUNTER — Telehealth: Payer: Self-pay | Admitting: Internal Medicine

## 2016-12-04 DIAGNOSIS — F331 Major depressive disorder, recurrent, moderate: Secondary | ICD-10-CM

## 2016-12-04 NOTE — Telephone Encounter (Signed)
Pt dropped off a letter for Dr. Derrel Nip. I will hand deliver to Dr. Derrel Nip.  David Rice

## 2016-12-10 ENCOUNTER — Telehealth: Payer: Self-pay

## 2016-12-10 ENCOUNTER — Encounter: Payer: Self-pay | Admitting: Cardiovascular Disease

## 2016-12-10 ENCOUNTER — Ambulatory Visit (INDEPENDENT_AMBULATORY_CARE_PROVIDER_SITE_OTHER): Payer: Medicare Other

## 2016-12-10 ENCOUNTER — Ambulatory Visit (INDEPENDENT_AMBULATORY_CARE_PROVIDER_SITE_OTHER): Payer: Medicare Other | Admitting: Cardiovascular Disease

## 2016-12-10 VITALS — BP 120/74 | HR 63

## 2016-12-10 DIAGNOSIS — I10 Essential (primary) hypertension: Secondary | ICD-10-CM | POA: Diagnosis not present

## 2016-12-10 DIAGNOSIS — I712 Thoracic aortic aneurysm, without rupture: Secondary | ICD-10-CM

## 2016-12-10 DIAGNOSIS — I4891 Unspecified atrial fibrillation: Secondary | ICD-10-CM

## 2016-12-10 DIAGNOSIS — E785 Hyperlipidemia, unspecified: Secondary | ICD-10-CM | POA: Diagnosis not present

## 2016-12-10 DIAGNOSIS — I7121 Aneurysm of the ascending aorta, without rupture: Secondary | ICD-10-CM

## 2016-12-10 LAB — POCT INR: INR: 1.7

## 2016-12-10 NOTE — Telephone Encounter (Signed)
Patient confirmed appointment change is ok

## 2016-12-10 NOTE — Patient Instructions (Signed)
Medication Instructions:  Your physician has recommended you make the following change in your medication:  Please refer to your anticoagulation summary given to you today   Labwork: BMET, CBC, and magnesium in two months  Testing/Procedures: none  Follow-Up: Your physician wants you to follow-up in: 1 year with Dr. Fletcher Anon.  You will receive a reminder letter in the mail two months in advance. If you don't receive a letter, please call our office to schedule the follow-up appointment.   Any Other Special Instructions Will Be Listed Below (If Applicable).     If you need a refill on your cardiac medications before your next appointment, please call your pharmacy.

## 2016-12-10 NOTE — Progress Notes (Signed)
Cardiology Office Note   Date:  12/10/2016   ID:  David Rice, David Rice 02-20-1946, MRN [id number]  PCP:  Crecencio Mc, MD  Cardiologist:   Kathlyn Sacramento, MD   Chief Complaint  Patient presents with  . other    3mo f/u. Pt states he is exercising often and doing good.  Reviewed meds with pt verbally.      History of Present Illness: David Rice is a 71 y.o. male who presents for a follow-up visit regarding persistent atrial fibrillation and PVCs. He was diagnosed with atrial fibrillation in December 2015 in [place]. He was initially treated with atenolol and flecainide but did not tolerate the medications due to extreme fatigue. He ultimately underwent ablation in July of 2016.  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 He has known history of hypertension and hyperlipidemia. Treadmill nuclear stress test in December 2015  was normal. There is no history of stroke or congestive heart failure.  He is known to have moderate size ascending aortic aneurysm followed by  by Dr. Pia Mau . The patient had recurrent symptomatic atrial fibrillation and was ultimately started on dofetilide last year with significant improvement in symptoms. He has been doing well overall with no chest pain, shortness of breath or palpitations. He has identified his atrial fibrillation triggers to be alcohol, excess carbohydrates and caffeine.  Past Medical History:  Diagnosis Date  . AAA (abdominal aortic aneurysm) ([place])   . Aorta aneurysm ([place])    "@ the valve; 4.5cm; [name] is watching it" (02/18/2016)  . Arthritis    "spine, shoulders, legs" (02/18/2016)  . Bowel obstruction   . Chronic lower back pain   . Diverticulitis   . Essential hypertension   . GERD (gastroesophageal reflux disease)   . Hearing impaired   . Hyperlipidemia   . Hypertension   . On home oxygen therapy    "don't know how much; use it at night" (02/18/2016)  . OSA on CPAP     . Persistent atrial fibrillation [facility])    Status post ablation in July 2016 in [place].  . Thoracic aortic aneurysm without rupture [facility])     Past Surgical History:  Procedure Laterality Date  . CARDIAC CATHETERIZATION  11/2014   [place]  . CARDIAC ELECTROPHYSIOLOGY MAPPING AND ABLATION  05/2015  . COLECTOMY  11/10/2013   "diverticulitis"  . COLON SURGERY    . INGUINAL HERNIA REPAIR Left 1978  . NASAL SINUS SURGERY Left 04/1987  . PERIPHERAL VASCULAR CATHETERIZATION N/A 08/02/2015   Procedure: Abdominal Aortogram w/Lower Extremity;  Surgeon: Katha Cabal, MD;  Location: [place] CV [facility];  Service: Cardiovascular;  Laterality: N/A;  . PERIPHERAL VASCULAR CATHETERIZATION  08/02/2015   Procedure: Lower Extremity Intervention;  Surgeon: Katha Cabal, MD;  Location: [place] CV LAB;  Service: Cardiovascular;;  . TEE WITHOUT CARDIOVERSION N/A 02/17/2016   Procedure: TRANSESOPHAGEAL ECHOCARDIOGRAM (TEE);  Surgeon: Sanda Klein, MD;  Location: [place];  Service: Cardiovascular;  Laterality: N/A;  . TONSILLECTOMY  1950  . WISDOM TOOTH EXTRACTION  1979     Current Outpatient Prescriptions  Medication Sig Dispense Refill  . amLODipine (NORVASC) 5 MG tablet Take 1 tablet (5 mg total) by mouth daily. 90 tablet 1  . atorvastatin (LIPITOR) 10 MG tablet Take 1 tablet (10 mg total) by mouth daily. 90 tablet 3  . dofetilide (TIKOSYN) 250 MCG capsule Take 1 capsule (250 mcg total) by mouth  2 (two) times daily. 180 capsule 3  . esomeprazole (NEXIUM) 20 MG capsule Take 20 mg by mouth every 3 (three) days. Only takes this medication twice a week    . Lactase (LACTAID PO) Take by mouth. Take  1 capsule twice daily.    Marland Kitchen lisinopril (PRINIVIL,ZESTRIL) 10 MG tablet Take 1 tablet (10 mg total) by mouth daily. 30 tablet 2  . loratadine (CLARITIN) 10 MG tablet Take 10 mg by mouth at bedtime.     . polyethylene glycol (MIRALAX / GLYCOLAX) packet Take 17 g by mouth daily  as needed.    . traMADol (ULTRAM) 50 MG tablet Take 1 tablet (50 mg total) by mouth every 6 (six) hours as needed. 90 tablet 2  . traZODone (DESYREL) 50 MG tablet 1/2 to 1 tablet by mouth One hour prior to bedtime 30 tablet 2  . warfarin (COUMADIN) 5 MG tablet Take as directed by [facility] Clinic 50 tablet 3   No current facility-administered medications for this visit.     Allergies:   Lactose intolerance (gi)    Social History:  The patient  reports that he has never smoked. He has never used smokeless tobacco. He reports that he does not drink alcohol or use drugs.   Family History:  The patient's family history includes Healthy in his daughter and son; Hypertension in his brother and mother; Lung cancer in his father.    ROS:  Please see the history of present illness.   Otherwise, review of systems are positive for none.   All other systems are reviewed and negative.    PHYSICAL EXAM: VS:  BP 120/74 (BP Location: Left Arm, Patient Position: Sitting, Cuff Size: Normal)   Pulse 63  , BMI There is no height or weight on file to calculate BMI. GEN: Well nourished, well developed, in no acute distress  HEENT: normal  Neck: no JVD, carotid bruits, or masses Cardiac: RRR; no murmurs, rubs, or gallops,no edema  Respiratory:  clear to auscultation bilaterally, normal work of breathing GI: soft, nontender, nondistended, + BS MS: no deformity or atrophy  Skin: warm and dry, no rash Neuro:  Strength and sensation are intact Psych: euthymic mood, full affect   EKG:  EKG is ordered today. The ekg ordered today demonstrates normal sinus rhythm with first-degree AV block with a PAC. Anteroseptal infarct. This is not new. QTC is 431 ms.   Recent Labs: 12/20/2015: TSH 1.38 02/18/2016: Hemoglobin 14.9; Platelets 128 03/30/2016: Magnesium 2.2 05/20/2016: ALT 24 07/14/2016: BUN 15; Creatinine, Ser 1.07; Potassium 4.2; Sodium 140    Lipid Panel    Component Value Date/Time   CHOL 153  05/20/2016 0950   TRIG 126.0 05/20/2016 0950   HDL 44.40 05/20/2016 0950   CHOLHDL 3 05/20/2016 0950   VLDL 25.2 05/20/2016 0950   LDLCALC 84 05/20/2016 0950      Wt Readings from Last 3 Encounters:  10/15/16 232 lb (105.2 kg)  09/25/16 231 lb 9.6 oz (105.1 kg)  07/14/16 241 lb (109.3 kg)       No flowsheet data found.    ASSESSMENT AND PLAN:  1.  Persistent atrial fibrillation:He is maintaining in sinus rhythm with dofetilide. He is on long-term anticoagulation with warfarin. Continue same management. He will try to get dofetilide from the [place] given that it is very expensive. I requested routine labs in 2 months from now given that he is on dofetilide.  2. Ascending aortic aneurysm: Moderate in size and followed by Dr.  Gerhardt.  3. Hyperlipidemia: Currently on atorvastatin with most recent LDL of 84. We should ideally name for an LDL below 70.  4. Essential hypertension: Blood pressure is well controlled on current medications. Lower extremity edema improved after decreasing amlodipine.   Disposition:   FU with me in 1 year  Signed,  Kathlyn Sacramento, MD  12/10/2016 12:22 PM    [facility]

## 2016-12-14 ENCOUNTER — Telehealth: Payer: Self-pay | Admitting: Internal Medicine

## 2016-12-14 NOTE — Telephone Encounter (Signed)
traMADol (ULTRAM) 50 MG tablet Qty 90 take one tablet every 6 hours as needed for pain.

## 2016-12-15 ENCOUNTER — Telehealth: Payer: Self-pay

## 2016-12-15 MED ORDER — TRAMADOL HCL 50 MG PO TABS: 50.0000 mg | ORAL_TABLET | Freq: Four times a day (QID) | ORAL | 2 refills | Status: DC | PRN

## 2016-12-15 MED ORDER — TRAMADOL HCL 50 MG PO TABS: 50.0000 mg | ORAL_TABLET | Freq: Four times a day (QID) | ORAL | 2 refills | Status: AC | PRN

## 2016-12-15 NOTE — Telephone Encounter (Signed)
Rx request Tramadol 50 mg Last refill 09/26/2015 Last OV 09/25/2016 Next OV 03/31/2017 Please advise

## 2016-12-15 NOTE — Telephone Encounter (Signed)
Rx ready to be sign and sent to pharmacy

## 2016-12-15 NOTE — Telephone Encounter (Signed)
Rx sent to pharmacy   

## 2016-12-15 NOTE — Addendum Note (Signed)
Addended by: Valere Dross on: 12/15/2016 02:30 PM   Modules accepted: Orders

## 2016-12-15 NOTE — Telephone Encounter (Signed)
Refill authorized please print

## 2016-12-21 DIAGNOSIS — H2513 Age-related nuclear cataract, bilateral: Secondary | ICD-10-CM | POA: Diagnosis not present

## 2016-12-25 LAB — HEMOGLOBIN A1C: Hemoglobin A1C: 5.6

## 2016-12-25 LAB — HEPATIC FUNCTION PANEL
ALT: 29 U/L (ref 10–40)
AST: 15 U/L (ref 14–40)
Alkaline Phosphatase: 86 U/L (ref 25–125)
Bilirubin, Total: 0.4 mg/dL

## 2016-12-25 LAB — BASIC METABOLIC PANEL
BUN: 16 mg/dL (ref 4–21)
Creatinine: 1.2 mg/dL (ref <=1.3)
Glucose: 115 mg/dL
Potassium: 4.2 mmol/L (ref 3.4–5.3)
Sodium: 140 mmol/L (ref 137–147)

## 2016-12-25 LAB — CBC AND DIFFERENTIAL
HCT: 46 % (ref 41–53)
Hemoglobin: 15.5 g/dL (ref 13.5–17.5)
Platelets: 151 10*3/uL (ref 150–399)
WBC: 6.8 10^3/mL

## 2016-12-25 LAB — TSH: TSH: 1.4 u[IU]/mL (ref <=5.90)

## 2016-12-25 LAB — LIPID PANEL
Cholesterol: 116 mg/dL (ref 0–200)
HDL: 50 mg/dL (ref 35–70)
LDL Cholesterol: 39 mg/dL
Triglycerides: 134 mg/dL (ref 40–160)

## 2016-12-25 LAB — VITAMIN B12: Vitamin B-12: 781

## 2016-12-27 ENCOUNTER — Encounter: Payer: Self-pay | Admitting: Internal Medicine

## 2016-12-28 ENCOUNTER — Ambulatory Visit (INDEPENDENT_AMBULATORY_CARE_PROVIDER_SITE_OTHER): Payer: Medicare Other | Admitting: Internal Medicine

## 2016-12-28 ENCOUNTER — Encounter: Payer: Self-pay | Admitting: Internal Medicine

## 2016-12-28 VITALS — BP 124/74 | HR 73 | Wt 228.0 lb

## 2016-12-28 DIAGNOSIS — G4730 Sleep apnea, unspecified: Secondary | ICD-10-CM | POA: Diagnosis not present

## 2016-12-28 NOTE — Patient Instructions (Signed)
Continue CPAP therapy as prescribed  

## 2016-12-28 NOTE — Progress Notes (Signed)
[facility] Consultation      Date: 12/28/2016,   MRN# [id number] David Rice 1946-07-21 Code Status:  Code Status History    Date Active Date Inactive Code Status Order ID Comments User Context   02/17/2016 12:27 PM 02/20/2016  4:55 PM Full Code [id number]  Baldwin Jamaica, PA-C Inpatient   08/02/2015  3:47 PM 08/04/2015  3:12 AM Full Code [id number]  Katha Cabal, MD Inpatient     Hosp day:@LENGTHOFSTAYDAYS @ Referring MD: @ATDPROV @     PCP:      AdmissionWeight: 228 lb (103.4 kg)                 CurrentWeight: 228 lb (103.4 kg) David Rice is a 71 y.o. old male seen in consultation for sleep apnea at the request of Self     CHIEF COMPLAINT:   I have sleep apnea   HISTORY OF PRESENT ILLNESS  71 yo pleasant white male seen today sleep apnea Patient dx with AHI 19 with OSA in feb 2016 Patient has been treated with CPAP since then His first presenting symptom was afib-he was on multiple medications for afib and part of work up was to assess for OSA  Patient has compliance report that shows 88% compliance >4hrs Has AHI 1.4 down from 19 He has increased leak from broken mask He has refreshed sleep now He has no acute signs of infection at this time He has lost 20 pounds due to diet  Patient feels well overall and wants to establish care for OSA   PAST MEDICAL HISTORY   Past Medical History:  Diagnosis Date  . AAA (abdominal aortic aneurysm) ([place])   . Aorta aneurysm ([place])    "@ the valve; 4.5cm; [name] is watching it" (02/18/2016)  . Arthritis    "spine, shoulders, legs" (02/18/2016)  . Bowel obstruction   . Chronic lower back pain   . Diverticulitis   . Essential hypertension   . GERD (gastroesophageal reflux disease)   . Hearing impaired   . Hyperlipidemia   . Hypertension   . On home oxygen therapy    "don't know how much; use it at night" (02/18/2016)  . OSA on CPAP   . Persistent atrial fibrillation [facility])    Status post  ablation in July 2016 in [place].  . Thoracic aortic aneurysm without rupture [facility])      SURGICAL HISTORY   Past Surgical History:  Procedure Laterality Date  . CARDIAC CATHETERIZATION  11/2014   [place]  . CARDIAC ELECTROPHYSIOLOGY MAPPING AND ABLATION  05/2015  . COLECTOMY  11/10/2013   "diverticulitis"  . COLON SURGERY    . INGUINAL HERNIA REPAIR Left 1978  . NASAL SINUS SURGERY Left 04/1987  . PERIPHERAL VASCULAR CATHETERIZATION N/A 08/02/2015   Procedure: Abdominal Aortogram w/Lower Extremity;  Surgeon: Katha Cabal, MD;  Location: [place] CV LAB;  Service: Cardiovascular;  Laterality: N/A;  . PERIPHERAL VASCULAR CATHETERIZATION  08/02/2015   Procedure: Lower Extremity Intervention;  Surgeon: Katha Cabal, MD;  Location: [place] CV LAB;  Service: Cardiovascular;;  . TEE WITHOUT CARDIOVERSION N/A 02/17/2016   Procedure: TRANSESOPHAGEAL ECHOCARDIOGRAM (TEE);  Surgeon: Sanda Klein, MD;  Location: [place];  Service: Cardiovascular;  Laterality: N/A;  . TONSILLECTOMY  1950  . WISDOM TOOTH EXTRACTION  1979     FAMILY HISTORY   Family History  Problem Relation Age of Onset  . Hypertension Mother     Living 14  .  Lung cancer Father     Deceased, 66  . Hypertension Brother   . Healthy Son   . Healthy Daughter      SOCIAL HISTORY   Social History  Substance Use Topics  . Smoking status: Never Smoker  . Smokeless tobacco: Never Used  . Alcohol use No     MEDICATIONS    Home Medication:  Current Outpatient Rx  . Order #: [id number] Class: Normal  . Order #: [id number] Class: Normal  . Order #: [id number] Class: Normal  . Order #: [id number] Class: Historical Med  . Order #: [id number] Class: Historical Med  . Order #: [id number] Class: Normal  . Order #: [id number] Class: Historical Med  . Order #: [id number] Class: Historical Med  . Order #: [id number] Class: Print  . Order #: [id number] Class: Normal  . Order #: [id number] Class:  Normal    Current Medication:  Current Outpatient Prescriptions:  .  amLODipine (NORVASC) 5 MG tablet, Take 1 tablet (5 mg total) by mouth daily., Disp: 90 tablet, Rfl: 1 .  atorvastatin (LIPITOR) 10 MG tablet, Take 1 tablet (10 mg total) by mouth daily., Disp: 90 tablet, Rfl: 3 .  dofetilide (TIKOSYN) 250 MCG capsule, Take 1 capsule (250 mcg total) by mouth 2 (two) times daily., Disp: 180 capsule, Rfl: 3 .  esomeprazole (NEXIUM) 20 MG capsule, Take 20 mg by mouth every 3 (three) days. Only takes this medication twice a week, Disp: , Rfl:  .  Lactase (LACTAID PO), Take by mouth. Take  1 capsule twice daily., Disp: , Rfl:  .  lisinopril (PRINIVIL,ZESTRIL) 10 MG tablet, Take 1 tablet (10 mg total) by mouth daily., Disp: 30 tablet, Rfl: 2 .  loratadine (CLARITIN) 10 MG tablet, Take 10 mg by mouth at bedtime. , Disp: , Rfl:  .  polyethylene glycol (MIRALAX / GLYCOLAX) packet, Take 17 g by mouth daily as needed., Disp: , Rfl:  .  traMADol (ULTRAM) 50 MG tablet, Take 1 tablet (50 mg total) by mouth every 6 (six) hours as needed., Disp: 90 tablet, Rfl: 2 .  traZODone (DESYREL) 50 MG tablet, 1/2 to 1 tablet by mouth One hour prior to bedtime, Disp: 30 tablet, Rfl: 2 .  warfarin (COUMADIN) 5 MG tablet, Take as directed by Coumadin Clinic, Disp: 50 tablet, Rfl: 3    ALLERGIES   Lactose intolerance (gi)     REVIEW OF SYSTEMS   Review of Systems  Constitutional: Negative for chills, diaphoresis, fever, malaise/fatigue and weight loss.  HENT: Negative for congestion and hearing loss.   Eyes: Negative for blurred vision and double vision.  Respiratory: Negative for cough, hemoptysis, sputum production, shortness of breath and wheezing.   Cardiovascular: Negative for chest pain, palpitations and orthopnea.  Gastrointestinal: Negative for abdominal pain, heartburn, nausea and vomiting.  Genitourinary: Negative for dysuria and urgency.  Musculoskeletal: Negative for back pain, myalgias and neck  pain.  Skin: Negative for rash.  Neurological: Negative for dizziness and weakness.  Endo/Heme/Allergies: Does not bruise/bleed easily.  Psychiatric/Behavioral: The patient is not nervous/anxious.   All other systems reviewed and are negative.    VS: BP 124/74 (BP Location: Left Arm, Cuff Size: Normal)   Pulse 73   Wt 228 lb (103.4 kg)   SpO2 96%   BMI 29.27 kg/m      PHYSICAL EXAM  Physical Exam  Constitutional: He is oriented to person, place, and time. He appears well-developed and well-nourished. No distress.  HENT:  Head: Normocephalic and atraumatic.  Mouth/Throat: No oropharyngeal exudate.  Eyes: EOM are normal.  Pupils are equal, round, and reactive to light. No scleral icterus.  Neck: Normal range of motion. Neck supple.  Cardiovascular: Normal rate, regular rhythm and normal heart sounds.   No murmur heard. Pulmonary/Chest: No stridor. No respiratory distress. He has no wheezes.  Abdominal: Soft. Bowel sounds are normal.  Musculoskeletal: Normal range of motion. He exhibits no edema.  Neurological: He is alert and oriented to person, place, and time. No cranial nerve deficit.  Skin: Skin is warm. He is not diaphoretic.  Psychiatric: He has a normal mood and affect.          ASSESSMENT/PLAN   71 yo pleasant white male with dx of OSA  1.continue CPAP therapy as prescribed -will provide company to assist with mask 2.follow up Cardiology for Afib  Follow up in 3 months    I have personally obtained a history, examined the patient, evaluated laboratory and independently reviewed imaging results, formulated the assessment and plan and placed orders.  The Patient requires high complexity decision making for assessment and support, frequent evaluation and titration of therapies, application of advanced monitoring technologies and extensive interpretation of multiple databases.  Patient satisfied with Plan of action and management. All questions  answered  Corrin Parker, M.D.  [facility]  Medical Director [place] Director [facility] Cardio-Pulmonary Department

## 2016-12-29 ENCOUNTER — Ambulatory Visit (INDEPENDENT_AMBULATORY_CARE_PROVIDER_SITE_OTHER): Payer: Medicare Other | Admitting: Psychology

## 2016-12-29 DIAGNOSIS — F4323 Adjustment disorder with mixed anxiety and depressed mood: Secondary | ICD-10-CM | POA: Diagnosis not present

## 2016-12-30 ENCOUNTER — Ambulatory Visit (INDEPENDENT_AMBULATORY_CARE_PROVIDER_SITE_OTHER): Payer: Medicare Other

## 2016-12-30 DIAGNOSIS — I4891 Unspecified atrial fibrillation: Secondary | ICD-10-CM | POA: Diagnosis not present

## 2016-12-30 LAB — POCT INR: INR: 2.4

## 2016-12-31 DIAGNOSIS — L574 Cutis laxa senilis: Secondary | ICD-10-CM | POA: Diagnosis not present

## 2017-01-04 ENCOUNTER — Other Ambulatory Visit: Payer: Self-pay | Admitting: Cardiovascular Disease

## 2017-01-05 ENCOUNTER — Other Ambulatory Visit: Payer: Self-pay | Admitting: Cardiovascular Disease

## 2017-01-05 MED ORDER — ATORVASTATIN CALCIUM 10 MG PO TABS: 10.0000 mg | ORAL_TABLET | Freq: Every day | ORAL | 3 refills | Status: AC

## 2017-01-21 ENCOUNTER — Encounter: Payer: Self-pay | Admitting: Internal Medicine

## 2017-01-21 ENCOUNTER — Ambulatory Visit (INDEPENDENT_AMBULATORY_CARE_PROVIDER_SITE_OTHER): Payer: Medicare Other | Admitting: Internal Medicine

## 2017-01-21 ENCOUNTER — Other Ambulatory Visit: Payer: Self-pay | Admitting: *Deleted

## 2017-01-21 ENCOUNTER — Telehealth: Payer: Self-pay | Admitting: Internal Medicine

## 2017-01-21 VITALS — BP 100/64 | HR 67 | Ht 74.0 in | Wt 229.2 lb

## 2017-01-21 DIAGNOSIS — I493 Ventricular premature depolarization: Secondary | ICD-10-CM | POA: Diagnosis not present

## 2017-01-21 DIAGNOSIS — R001 Bradycardia, unspecified: Secondary | ICD-10-CM

## 2017-01-21 DIAGNOSIS — I480 Paroxysmal atrial fibrillation: Secondary | ICD-10-CM | POA: Diagnosis not present

## 2017-01-21 DIAGNOSIS — Z79899 Other long term (current) drug therapy: Secondary | ICD-10-CM

## 2017-01-21 DIAGNOSIS — G4733 Obstructive sleep apnea (adult) (pediatric): Secondary | ICD-10-CM

## 2017-01-21 MED ORDER — LISINOPRIL 10 MG PO TABS: 10.0000 mg | ORAL_TABLET | Freq: Every day | ORAL | 5 refills | Status: AC

## 2017-01-21 NOTE — Progress Notes (Signed)
Patient Care Team: Crecencio Mc, MD as PCP - General (Internal Medicine)   HPI  David Rice is a 71 y.o. male seen in follow-up for atrial fibrillation and PVCs in the context of sinus bradycardia ; he has treated his sleep apnea  Because of ongoing issues with palpitations, he was admitted for the initiation of dofetilide 4/17  He has had markedly less atrial fibrillation; it seems to be prompted mostly by alcohol--especially if he is tired and has 2 glasses of wine.   He has a history of atrial fibrillation diagnosed apparently 12/15. Therapies with atenolol and flecainide were not tolerated and he underwent catheter ablation 7/16 [facility] .   This was complicated by a left femoral pseudoaneurysm and he underwent coiling in July of this year.   He underwent catheterization 1/16 [facility]. This apparently demonstrated no obstructive coronary disease.  (per patient). Aortic aneurysm (4.8 cm) (3/17) was noted; repeat scanning 11/17 was no interval change followed by Dr.   Gerilyn Pilgrim   Current recommendations her blood pressure control and lipid management  Records and Results Reviewed stress Myoview report from 2015 at [facility] was normal   Antiarrhythmics Date  flecainide 2016/   propafenone 2016 /   dofetilide 4/17      5/17 K4.1 Cr 1.11  7/17 K3.6 Cr 1.25  LDL 84   Past Medical History:  Diagnosis Date  . AAA (abdominal aortic aneurysm) ([place])   . Aorta aneurysm ([place])    "@ the valve; 4.5cm; [name] is watching it" (02/18/2016)  . Arthritis    "spine, shoulders, legs" (02/18/2016)  . Bowel obstruction   . Chronic lower back pain   . Diverticulitis   . Essential hypertension   . GERD (gastroesophageal reflux disease)   . Hearing impaired   . Hyperlipidemia   . Hypertension   . On home oxygen therapy    "don't know how much; use it at night" (02/18/2016)  . OSA on CPAP   . Persistent atrial fibrillation [facility])    Status post ablation in  July 2016 in [place].  . Thoracic aortic aneurysm without rupture [facility])     Past Surgical History:  Procedure Laterality Date  . CARDIAC CATHETERIZATION  11/2014   [place]  . CARDIAC ELECTROPHYSIOLOGY MAPPING AND ABLATION  05/2015  . COLECTOMY  11/10/2013   "diverticulitis"  . COLON SURGERY    . INGUINAL HERNIA REPAIR Left 1978  . NASAL SINUS SURGERY Left 04/1987  . PERIPHERAL VASCULAR CATHETERIZATION N/A 08/02/2015   Procedure: Abdominal Aortogram w/Lower Extremity;  Surgeon: Katha Cabal, MD;  Location: [place] CV LAB;  Service: Cardiovascular;  Laterality: N/A;  . PERIPHERAL VASCULAR CATHETERIZATION  08/02/2015   Procedure: Lower Extremity Intervention;  Surgeon: Katha Cabal, MD;  Location: [place] CV LAB;  Service: Cardiovascular;;  . TEE WITHOUT CARDIOVERSION N/A 02/17/2016   Procedure: TRANSESOPHAGEAL ECHOCARDIOGRAM (TEE);  Surgeon: Sanda Klein, MD;  Location: [place];  Service: Cardiovascular;  Laterality: N/A;  . TONSILLECTOMY  1950  . WISDOM TOOTH EXTRACTION  1979    Current Outpatient Prescriptions  Medication Sig Dispense Refill  . amLODipine (NORVASC) 5 MG tablet Take 1 tablet (5 mg total) by mouth daily. 90 tablet 1  . atorvastatin (LIPITOR) 10 MG tablet Take 1 tablet (10 mg total) by mouth daily. 90 tablet 3  . dofetilide (TIKOSYN) 250 MCG capsule Take 1 capsule (250 mcg total) by mouth 2 (two) times  daily. 180 capsule 3  . esomeprazole (NEXIUM) 20 MG capsule Take 20 mg by mouth every 3 (three) days. Only takes this medication twice a week    . Lactase (LACTAID PO) Take by mouth. Take  1 capsule twice daily.    Marland Kitchen lisinopril (PRINIVIL,ZESTRIL) 10 MG tablet Take 1 tablet (10 mg total) by mouth daily. 30 tablet 2  . loratadine (CLARITIN) 10 MG tablet Take 10 mg by mouth at bedtime.     . polyethylene glycol (MIRALAX / GLYCOLAX) packet Take 17 g by mouth daily as needed.    . tadalafil (CIALIS) 5 MG tablet Take 5 mg by mouth  daily.    . traMADol (ULTRAM) 50 MG tablet Take 1 tablet (50 mg total) by mouth every 6 (six) hours as needed. 90 tablet 2  . traZODone (DESYREL) 50 MG tablet 1/2 to 1 tablet by mouth One hour prior to bedtime 30 tablet 2  . warfarin (COUMADIN) 5 MG tablet Take as directed by [facility] Clinic 50 tablet 3   No current facility-administered medications for this visit.     Allergies  Allergen Reactions  . Lactose Intolerance (Gi)       Review of Systems negative except from HPI and PMH  Physical Exam BP 100/64 (BP Location: Left Arm, Patient Position: Sitting, Cuff Size: Normal)   Pulse 67   Ht 6\' 2"  (1.88 m)   Wt 229 lb 4 oz (104 kg)   BMI 29.43 kg/m  Well developed and well nourished in no acute distress HENT normal E scleral and icterus clear Neck Supple JVP flat; carotids brisk and full Clear to ausculation Regular rate and rhythm, no murmurs gallops or rub Soft with active bowel sounds No clubbing cyanosis { Edema Alert and oriented, grossly normal motor and sensory function Skin Warm and Dry  ECG demonstrates  Normal 67 20/07/39   Assessment and  Plan  Atrial fibrillation-paroxysmal//History of atrial fibrillation ablation  PVCs-frequent  Sinus bradycardia  Aortic aneurysm  High Risk Medication Surveillance  Obstructive sleep apnea-treated  Hyper/o kalemia  Dyslipidemia      He is tolerating his dofetilide; it has been very effective. He relates atrial fibrillation recurrence to alcohol which he uses to mitigate the stress associated with his mother  He is on warfarin and tolerating it well. He is not interested in NOACs. This was reiterated today.   His blood pressure has been better with even small amounts of weight loss.  Recordings at home suggests that he is on target he is in the 120-30 range. I will defer to Dr. Gerilyn Pilgrim about the role of beta blockers  We need to get the lab work from the [place] which was drawn last week.   Per evaluation by Dr. Gerilyn Pilgrim  , LDL target is 70; will increase his atorvastatin from 10--20

## 2017-01-21 NOTE — Telephone Encounter (Signed)
Pt needs refills on the following sent to [place] -traZODone (DESYREL) 50 MG tablet -lisinopril (PRINIVIL,ZESTRIL) 10 MG tablet

## 2017-01-21 NOTE — Patient Instructions (Signed)
Medication Instructions: - none ordered  Labwork: - none ordered  Procedures/Testing: - none ordered  Follow-Up: - Your physician wants you to follow-up in: 6 months with Dr. Caryl Comes. You will receive a reminder letter in the mail two months in advance. If you don't receive a letter, please call our office to schedule the follow-up appointment.  Any Additional Special Instructions Will Be Listed Below (If Applicable).     If you need a refill on your cardiac medications before your next appointment, please call your pharmacy.

## 2017-01-21 NOTE — Telephone Encounter (Signed)
Trazodone Refilled on 09/25/2016 Last Ov: 09/25/2016 Next Ov: 03/31/2017  Please advise.

## 2017-01-22 MED ORDER — TRAZODONE HCL 50 MG PO TABS: ORAL_TABLET | ORAL | 1 refills | Status: AC

## 2017-01-22 NOTE — Telephone Encounter (Signed)
FYI  Patient feedback

## 2017-01-22 NOTE — Telephone Encounter (Signed)
REFILLED

## 2017-01-26 ENCOUNTER — Other Ambulatory Visit: Payer: Self-pay

## 2017-01-27 ENCOUNTER — Ambulatory Visit (INDEPENDENT_AMBULATORY_CARE_PROVIDER_SITE_OTHER): Payer: Medicare Other

## 2017-01-27 DIAGNOSIS — I4891 Unspecified atrial fibrillation: Secondary | ICD-10-CM

## 2017-01-27 LAB — POCT INR: INR: 1.8

## 2017-02-10 ENCOUNTER — Other Ambulatory Visit: Payer: Medicare Other

## 2017-02-13 DIAGNOSIS — J309 Allergic rhinitis, unspecified: Secondary | ICD-10-CM | POA: Diagnosis not present

## 2017-02-13 DIAGNOSIS — J209 Acute bronchitis, unspecified: Secondary | ICD-10-CM | POA: Diagnosis not present

## 2017-02-15 DIAGNOSIS — J209 Acute bronchitis, unspecified: Secondary | ICD-10-CM | POA: Diagnosis not present

## 2017-02-15 DIAGNOSIS — J309 Allergic rhinitis, unspecified: Secondary | ICD-10-CM | POA: Diagnosis not present

## 2017-02-23 ENCOUNTER — Other Ambulatory Visit: Payer: Medicare Other

## 2017-02-24 ENCOUNTER — Other Ambulatory Visit (INDEPENDENT_AMBULATORY_CARE_PROVIDER_SITE_OTHER): Payer: Medicare Other

## 2017-02-24 ENCOUNTER — Ambulatory Visit (INDEPENDENT_AMBULATORY_CARE_PROVIDER_SITE_OTHER): Payer: Medicare Other

## 2017-02-24 DIAGNOSIS — I4891 Unspecified atrial fibrillation: Secondary | ICD-10-CM

## 2017-02-24 LAB — POCT INR: INR: 3.1

## 2017-02-25 LAB — BASIC METABOLIC PANEL
BUN/Creatinine Ratio: 12 (ref 10–24)
BUN: 13 mg/dL (ref 8–27)
CALCIUM: 9.2 mg/dL (ref 8.6–10.2)
CHLORIDE: 102 mmol/L (ref 96–106)
CO2: 25 mmol/L (ref 18–29)
Creatinine, Ser: 1.07 mg/dL (ref 0.76–1.27)
GFR calc Af Amer: 81 mL/min/{1.73_m2} (ref >59)
GFR, EST NON AFRICAN AMERICAN: 70 mL/min/{1.73_m2} (ref >59)
Glucose: 89 mg/dL (ref 65–99)
POTASSIUM: 4.2 mmol/L (ref 3.5–5.2)
Sodium: 139 mmol/L (ref 134–144)

## 2017-02-25 LAB — CBC
HEMATOCRIT: 46.6 % (ref 37.5–51.0)
HEMOGLOBIN: 15.5 g/dL (ref 13.0–17.7)
MCH: 31.1 pg (ref 26.6–33.0)
MCHC: 33.3 g/dL (ref 31.5–35.7)
MCV: 93 fL (ref 79–97)
Platelets: 174 10*3/uL (ref 150–379)
RBC: 4.99 x10E6/uL (ref 4.14–5.80)
RDW: 13.8 % (ref 12.3–15.4)
WBC: 5.8 10*3/uL (ref 3.4–10.8)

## 2017-02-25 LAB — MAGNESIUM: MAGNESIUM: 2.3 mg/dL (ref 1.6–2.3)

## 2017-02-26 ENCOUNTER — Other Ambulatory Visit: Payer: Self-pay | Admitting: Internal Medicine

## 2017-03-11 ENCOUNTER — Ambulatory Visit: Payer: Medicare Other | Admitting: Psychology

## 2017-03-13 ENCOUNTER — Encounter: Payer: Self-pay | Admitting: Internal Medicine

## 2017-03-17 ENCOUNTER — Ambulatory Visit (INDEPENDENT_AMBULATORY_CARE_PROVIDER_SITE_OTHER): Payer: Medicare Other | Admitting: *Deleted

## 2017-03-17 DIAGNOSIS — I4891 Unspecified atrial fibrillation: Secondary | ICD-10-CM

## 2017-03-17 LAB — POCT INR: INR: 2.9

## 2017-03-29 ENCOUNTER — Telehealth: Payer: Self-pay | Admitting: Internal Medicine

## 2017-03-29 NOTE — Telephone Encounter (Signed)
Pt would like to know if he needs labs before physical on 04/09/17. Please let me and I will schedule labs.  Thanks.

## 2017-03-30 ENCOUNTER — Ambulatory Visit: Payer: Medicare Other | Admitting: Internal Medicine

## 2017-03-30 ENCOUNTER — Ambulatory Visit: Admit: 2017-03-30 | Payer: Medicare Other | Admitting: Ophthalmology

## 2017-03-30 SURGERY — BLEPHAROPLASTY
Anesthesia: IV Sedation (MBSC Only) | Laterality: Bilateral

## 2017-03-30 NOTE — Telephone Encounter (Signed)
Patient advised and verbalized understanding 

## 2017-03-30 NOTE — Telephone Encounter (Signed)
Looks like he just had several labs done in February. See there anything else that you would like to have drawn before his appt on 04/09/2017? Please advise.

## 2017-03-30 NOTE — Telephone Encounter (Signed)
No labs needed

## 2017-03-31 ENCOUNTER — Encounter: Payer: Medicare Other | Admitting: Internal Medicine

## 2017-04-05 ENCOUNTER — Ambulatory Visit: Payer: Medicare Other | Admitting: Internal Medicine

## 2017-04-07 ENCOUNTER — Ambulatory Visit (INDEPENDENT_AMBULATORY_CARE_PROVIDER_SITE_OTHER): Payer: Medicare Other

## 2017-04-07 ENCOUNTER — Encounter: Payer: Self-pay | Admitting: Internal Medicine

## 2017-04-07 ENCOUNTER — Ambulatory Visit (INDEPENDENT_AMBULATORY_CARE_PROVIDER_SITE_OTHER): Payer: Medicare Other | Admitting: Internal Medicine

## 2017-04-07 VITALS — BP 140/82 | HR 78 | Ht 74.0 in | Wt 230.0 lb

## 2017-04-07 DIAGNOSIS — G4730 Sleep apnea, unspecified: Secondary | ICD-10-CM | POA: Diagnosis not present

## 2017-04-07 DIAGNOSIS — I4891 Unspecified atrial fibrillation: Secondary | ICD-10-CM

## 2017-04-07 LAB — POCT INR: INR: 2.7

## 2017-04-07 NOTE — Patient Instructions (Addendum)
Continue Sleep apnea Therapy Start Dymista

## 2017-04-07 NOTE — Progress Notes (Signed)
David Rice      Date: 04/07/2017,   MRN# [id number] David Rice November 04, 1946 Code Status:  Code Status History    Date Active Date Inactive Code Status Order ID Comments User Context   02/17/2016 12:27 PM 02/20/2016  4:55 PM Full Code [id number]  Baldwin [place], PA-C Inpatient   08/02/2015  3:47 PM 08/04/2015  3:12 AM Full Code [id number]  Katha Cabal, MD Inpatient     Hosp day:@LENGTHOFSTAYDAYS @ Referring MD: @ATDPROV @     PCP:      Admission                  Current  David [facility] is a 71 y.o. old male seen in [place] for sleep apnea at the request of Self     CHIEF COMPLAINT:   Follow up Sleep apnea   HISTORY OF PRESENT ILLNESS  Follow up sleep apnea today Patient dx with AHI 19 with OSA in feb 2016 Patient has been treated with CPAP since then His first presenting symptom was afib-he was on multiple medications for afib and part of work up was to assess for OSA  Last compliance report Patient has compliance report that shows 88% compliance >4hrs Has AHI 1.4 down from 19  Current Report shows 93% compliance AHI 1.8   He has refreshed sleep now He has no acute signs of infection at this time Has allergic rhinitis and has had rebound affect from afrin therapy  Patient feels well overall  No signs of infection at this time  Current Outpatient Prescriptions:  .  amLODipine (NORVASC) 5 MG tablet, Take 1 tablet (5 mg total) by mouth daily., Disp: 90 tablet, Rfl: 0 .  atorvastatin (LIPITOR) 10 MG tablet, Take 1 tablet (10 mg total) by mouth daily., Disp: 90 tablet, Rfl: 3 .  dofetilide (TIKOSYN) 250 MCG capsule, Take 1 capsule (250 mcg total) by mouth 2 (two) times daily., Disp: 180 capsule, Rfl: 3 .  esomeprazole (NEXIUM) 20 MG capsule, Take 20 mg by mouth every 3 (three) days. Only takes this medication twice a week, Disp: , Rfl:  .  Lactase (LACTAID PO), Take by mouth. Take  1 capsule twice daily., Disp: , Rfl:  .   lisinopril (PRINIVIL,ZESTRIL) 10 MG tablet, Take 1 tablet (10 mg total) by mouth daily., Disp: 30 tablet, Rfl: 5 .  loratadine (CLARITIN) 10 MG tablet, Take 10 mg by mouth at bedtime. , Disp: , Rfl:  .  polyethylene glycol (MIRALAX / GLYCOLAX) packet, Take 17 g by mouth daily as needed., Disp: , Rfl:  .  tadalafil (CIALIS) 5 MG tablet, Take 5 mg by mouth daily., Disp: , Rfl:  .  traMADol (ULTRAM) 50 MG tablet, Take 1 tablet (50 mg total) by mouth every 6 (six) hours as needed., Disp: 90 tablet, Rfl: 2 .  traZODone (DESYREL) 50 MG tablet, 1/2 to 1 tablet by mouth One hour prior to bedtime, Disp: 90 tablet, Rfl: 1 .  warfarin (COUMADIN) 5 MG tablet, Take as directed by [facility] Clinic, Disp: 50 tablet, Rfl: 3    ALLERGIES   Lactose intolerance (gi)     REVIEW OF SYSTEMS   Review of Systems  Constitutional: Negative for chills, diaphoresis, fever, malaise/fatigue and weight loss.  HENT: Negative for congestion.   Respiratory: Negative for cough, hemoptysis, sputum production, shortness of breath and wheezing.   Cardiovascular: Negative for chest pain, palpitations and orthopnea.  Gastrointestinal: Negative for heartburn and nausea.  Neurological: Negative  for weakness.  All other systems reviewed and are negative.   BP 140/82 (BP Location: Left Arm, Cuff Size: Normal)   Pulse 78   Ht 6\' 2"  (1.88 m)   Wt 230 lb (104.3 kg)   SpO2 96%   BMI 29.53 kg/m    PHYSICAL EXAM  Physical Exam  Constitutional: He is oriented to person, place, and time. He appears well-developed and well-nourished. No distress.  Cardiovascular: Normal rate, regular rhythm and normal heart sounds.   No murmur heard. Pulmonary/Chest: Effort normal and breath sounds normal. No respiratory distress. He has no wheezes.  Musculoskeletal: Normal range of motion. He exhibits no edema.  Neurological: He is alert and oriented to person, place, and time. No cranial nerve deficit.  Skin: Skin is warm. He is not  diaphoretic.  Psychiatric: He has a normal mood and affect.          ASSESSMENT/PLAN   71 yo pleasant white male with dx of OSA with current AHI 1.8 with chronic allergic rhinitis  1.continue auto  CPAP 5-15cmh20 therapy as prescribed  2.follow up Cardiology for Afib Continue coumadin  3.allergic rhinitis Start dymista and continue antihistamine therapy  Follow up in 1 year or as needed   Patient satisfied with Plan of action and management. All questions answered  Corrin Parker, M.D.  [facility]  Medical Director [place] Director [facility] Cardio-Pulmonary Department

## 2017-04-08 ENCOUNTER — Ambulatory Visit (INDEPENDENT_AMBULATORY_CARE_PROVIDER_SITE_OTHER): Payer: Medicare Other | Admitting: Psychology

## 2017-04-08 DIAGNOSIS — F4323 Adjustment disorder with mixed anxiety and depressed mood: Secondary | ICD-10-CM

## 2017-04-09 ENCOUNTER — Ambulatory Visit (INDEPENDENT_AMBULATORY_CARE_PROVIDER_SITE_OTHER): Payer: Medicare Other | Admitting: Internal Medicine

## 2017-04-09 ENCOUNTER — Encounter: Payer: Self-pay | Admitting: Internal Medicine

## 2017-04-09 VITALS — BP 122/68 | HR 65 | Temp 98.1°F | Resp 16 | Ht 74.0 in | Wt 230.4 lb

## 2017-04-09 DIAGNOSIS — I712 Thoracic aortic aneurysm, without rupture: Secondary | ICD-10-CM

## 2017-04-09 DIAGNOSIS — F419 Anxiety disorder, unspecified: Secondary | ICD-10-CM | POA: Diagnosis not present

## 2017-04-09 DIAGNOSIS — E6609 Other obesity due to excess calories: Secondary | ICD-10-CM

## 2017-04-09 DIAGNOSIS — F5105 Insomnia due to other mental disorder: Secondary | ICD-10-CM

## 2017-04-09 DIAGNOSIS — E785 Hyperlipidemia, unspecified: Secondary | ICD-10-CM

## 2017-04-09 DIAGNOSIS — I7121 Aneurysm of the ascending aorta, without rupture: Secondary | ICD-10-CM

## 2017-04-09 NOTE — Patient Instructions (Addendum)
I recommend adding colace (docusate) 100 to 200 mg daily as  A daily Stool softener , ok to add to daily miralax.  Referral to [place] is in process   Your mother's history of colon cancer  Does actually change your screening interval to "every 5 years,"  So next year we will discuss the need for colonoscopy again  We will get you set up to see our health coach for your welllness visit (Medicare wants it)    Health Maintenance, Male A healthy lifestyle and preventive care is important for your health and wellness. Ask your health care provider about what schedule of regular examinations is right for you. What should I know about weight and diet?  Eat a Healthy Diet  Eat plenty of vegetables, fruits, whole grains, low-fat dairy products, and lean protein.  Do not eat a lot of foods high in solid fats, added sugars, or salt. Maintain a Healthy Weight  Regular exercise can help you achieve or maintain a healthy weight. You should:  Do at least 150 minutes of exercise each week. The exercise should increase your heart rate and make you sweat (moderate-intensity exercise).  Do strength-training exercises at least twice a week. Watch Your Levels of Cholesterol and Blood Lipids  Have your blood tested for lipids and cholesterol every 5 years starting at 71 years of age. If you are at high risk for heart disease, you should start having your blood tested when you are 71 years old. You may need to have your cholesterol levels checked more often if:  Your lipid or cholesterol levels are high.  You are older than 71 years of age.  You are at high risk for heart disease. What should I know about cancer screening? Many types of cancers can be detected early and may often be prevented. Lung Cancer  You should be screened every year for lung cancer if:  You are a current smoker who has smoked for at least 30 years.  You are a former smoker who has quit within the past 15  years.  Talk to your health care provider about your screening options, when you should start screening, and how often you should be screened. Colorectal Cancer  Routine colorectal cancer screening usually begins at 71 years of age and should be repeated every 5-10 years until you are 71 years old. You may need to be screened more often if early forms of precancerous polyps or small growths are found. Your health care provider may recommend screening at an earlier age if you have risk factors for colon cancer.  Your health care provider may recommend using home test kits to check for hidden blood in the stool.  A small camera at the end of a tube can be used to examine your colon (sigmoidoscopy or colonoscopy). This checks for the earliest forms of colorectal cancer. Prostate and Testicular Cancer  Depending on your age and overall health, your health care provider may do certain tests to screen for prostate and testicular cancer.  Talk to your health care provider about any symptoms or concerns you have about testicular or prostate cancer. Skin Cancer  Check your skin from head to toe regularly.  Tell your health care provider about any new moles or changes in moles, especially if:  There is a change in a mole's size, shape, or color.  You have a mole that is larger than a pencil eraser.  Always use sunscreen. Apply sunscreen liberally and repeat throughout  the day.  Protect yourself by wearing long sleeves, pants, a wide-brimmed hat, and sunglasses when outside. What should I know about heart disease, diabetes, and high blood pressure?  If you are 84-15 years of age, have your blood pressure checked every 3-5 years. If you are 68 years of age or older, have your blood pressure checked every year. You should have your blood pressure measured twice-once when you are at a hospital or clinic, and once when you are not at a hospital or clinic. Record the average of the two measurements. To  check your blood pressure when you are not at a hospital or clinic, you can use:  An automated blood pressure machine at a pharmacy.  A home blood pressure monitor.  Talk to your health care provider about your target blood pressure.  If you are between 22-52 years old, ask your health care provider if you should take aspirin to prevent heart disease.  Have regular diabetes screenings by checking your fasting blood sugar level.  If you are at a normal weight and have a low risk for diabetes, have this test once every three years after the age of 4.  If you are overweight and have a high risk for diabetes, consider being tested at a younger age or more often.  A one-time screening for abdominal aortic aneurysm (AAA) by ultrasound is recommended for men aged 82-75 years who are current or former smokers. What should I know about preventing infection? Hepatitis B  If you have a higher risk for hepatitis B, you should be screened for this virus. Talk with your health care provider to find out if you are at risk for hepatitis B infection. Hepatitis C  Blood testing is recommended for:  Everyone born from [birth year] through 1965.  Anyone with known risk factors for hepatitis C. Sexually Transmitted Diseases (STDs)  You should be screened each year for STDs including gonorrhea and chlamydia if:  You are sexually active and are younger than 71 years of age.  You are older than 71 years of age and your health care provider tells you that you are at risk for this type of infection.  Your sexual activity has changed since you were last screened and you are at an increased risk for chlamydia or gonorrhea. Ask your health care provider if you are at risk.  Talk with your health care provider about whether you are at high risk of being infected with HIV. Your health care provider may recommend a prescription medicine to help prevent HIV infection. What else can I do?  Schedule regular health,  dental, and eye exams.  Stay current with your vaccines (immunizations).  Do not use any tobacco products, such as cigarettes, chewing tobacco, and e-cigarettes. If you need help quitting, ask your health care provider.  Limit alcohol intake to no more than 2 drinks per day. One drink equals 12 ounces of beer, 5 ounces of wine, or 1 ounces of hard liquor.  Do not use street drugs.  Do not share needles.  Ask your health care provider for help if you need support or information about quitting drugs.  Tell your health care provider if you often feel depressed.  Tell your health care provider if you have ever been abused or do not feel safe at home. This information is not intended to replace advice given to you by your health care provider. Make sure you discuss any questions you have with your health care provider. Document Released:  04/30/2008 Document Revised: 07/01/2016 Document Reviewed: 08/06/2015 Elsevier Interactive Patient Education  2017 [employer].

## 2017-04-09 NOTE — Progress Notes (Signed)
Subjective:  Patient ID: David Rice, male    DOB: [birth year]  Age: 71 y.o. MRN: [id number]  CC: The primary encounter diagnosis was Class 2 obesity due to excess calories without serious comorbidity in adult, unspecified BMI. Diagnoses of Insomnia secondary to anxiety, Ascending aortic aneurysm ([place]), and Hyperlipidemia, unspecified hyperlipidemia type were also pertinent to this visit.  HPI ABBIE JABLON presents for follow up on chronic issues including hypertension, hyperlipidemia,, atrial fib and OSA managed by pulmonology  colonoscopy 2014,  Bowel resection 2015 [name] [place] . Follow up 2024  mother diagnosed with colon ca at age 3   2 page list of concerns:   Lost weight of 7 lbs in February, ,  "fell off the wagon"  During period of not sleeping well due to congestion and allergic rhinitis .  Seeing [name] for emotional distress secondary to ongoing issues with  mother. .  Has multiple symptoms of depression bt seeing a therapist and does not want therapy  Wants referral to [place]  For management of obesity Still suffers from constipation; using miralax    History of colonic resection 10 inches,  Drinks   At least 64 ounces of water  Daily,  2 cups of coffee AM only. . 2 nocturnal voids.   Has recurrent chest wall pain ,  Cardiology aware,   No results found for: PSA sees Urology   [facility] for urinary hestancy,  Now using cialis 5 mg daily  Purchased from [place]    Had an ablation for atrial fibrillation ,   Made the symptoms worse. Now on tikosyn with improved symptoms, "I have my life back"   Using trazodone for sleep.  Lasts about 4 hours. doesn't need it every night,   Thoracic spine pain and sternal injury  due to old injuries,  Some mobility issues turning  bilateral shoulder pain managed with prn tramadol    Outpatient Medications Prior to Visit  Medication Sig Dispense Refill  . amLODipine (NORVASC) 5 MG tablet Take 1 tablet  (5 mg total) by mouth daily. 90 tablet 0  . atorvastatin (LIPITOR) 10 MG tablet Take 1 tablet (10 mg total) by mouth daily. 90 tablet 3  . dofetilide (TIKOSYN) 250 MCG capsule Take 1 capsule (250 mcg total) by mouth 2 (two) times daily. 180 capsule 3  . esomeprazole (NEXIUM) 20 MG capsule Take 20 mg by mouth every 3 (three) days. Only takes this medication twice a week    . Lactase (LACTAID PO) Take by mouth. Take  1 capsule twice daily.    Marland Kitchen lisinopril (PRINIVIL,ZESTRIL) 10 MG tablet Take 1 tablet (10 mg total) by mouth daily. 30 tablet 5  . loratadine (CLARITIN) 10 MG tablet Take 10 mg by mouth 2 (two) times daily.     . polyethylene glycol (MIRALAX / GLYCOLAX) packet Take 17 g by mouth daily as needed.    . tadalafil (CIALIS) 5 MG tablet Take 5 mg by mouth daily.    . traMADol (ULTRAM) 50 MG tablet Take 1 tablet (50 mg total) by mouth every 6 (six) hours as needed. 90 tablet 2  . traZODone (DESYREL) 50 MG tablet 1/2 to 1 tablet by mouth One hour prior to bedtime 90 tablet 1  . warfarin (COUMADIN) 5 MG tablet Take as directed by Coumadin Clinic 50 tablet 3   No facility-administered medications prior to visit.     Review of Systems;  Patient denies headache, fevers, malaise, unintentional weight loss,  skin rash, eye pain, sinus congestion and sinus pain, sore throat, dysphagia,  hemoptysis , cough, dyspnea, wheezing, chest pain, palpitations, orthopnea, edema, abdominal pain, nausea, melena, diarrhea, constipation, flank pain, dysuria, hematuria, urinary  Frequency, nocturia, numbness, tingling, seizures,  Focal weakness, Loss of consciousness,  Tremor, insomnia, depression, anxiety, and suicidal ideation.      Objective:  BP 122/68 (BP Location: Left Arm, Patient Position: Sitting, Cuff Size: Normal)   Pulse 65   Temp 98.1 F (36.7 C) (Oral)   Resp 16   Ht 6\' 2"  (1.88 m)   Wt 230 lb 6.4 oz (104.5 kg)   SpO2 96%   BMI 29.58 kg/m   BP Readings from Last 3 Encounters:  04/09/17  122/68  04/07/17 140/82  01/21/17 100/64    Wt Readings from Last 3 Encounters:  04/09/17 230 lb 6.4 oz (104.5 kg)  04/07/17 230 lb (104.3 kg)  01/21/17 229 lb 4 oz (104 kg)    General appearance: alert, cooperative and appears stated age Ears: normal TM's and external ear canals both ears Throat: lips, mucosa, and tongue normal; teeth and gums normal Neck: no adenopathy, no carotid bruit, supple, symmetrical, trachea midline and thyroid not enlarged, symmetric, no tenderness/mass/nodules Back: symmetric, no curvature. ROM normal. No CVA tenderness. Lungs: clear to auscultation bilaterally Heart: regular rate and rhythm, S1, S2 normal, no murmur, click, rub or gallop Abdomen: soft, non-tender; bowel sounds normal; no masses,  no organomegaly Pulses: 2+ and symmetric Skin: Skin color, texture, turgor normal. No rashes or lesions Lymph nodes: Cervical, supraclavicular, and axillary nodes normal.  Lab Results  Component Value Date   HGBA1C 5.6 12/25/2016   HGBA1C 5.8 05/20/2016    Lab Results  Component Value Date   CREATININE 1.07 02/24/2017   CREATININE 1.2 12/25/2016   CREATININE 1.07 07/14/2016    Lab Results  Component Value Date   WBC 5.8 02/24/2017   HGB 15.5 12/25/2016   HCT 46.6 02/24/2017   PLT 174 02/24/2017   GLUCOSE 89 02/24/2017   CHOL 116 12/25/2016   TRIG 134 12/25/2016   HDL 50 12/25/2016   LDLCALC 39 12/25/2016   ALT 29 12/25/2016   AST 15 12/25/2016   NA 139 02/24/2017   K 4.2 02/24/2017   CL 102 02/24/2017   CREATININE 1.07 02/24/2017   BUN 13 02/24/2017   CO2 25 02/24/2017   TSH 1.40 12/25/2016   INR 2.7 04/07/2017   HGBA1C 5.6 12/25/2016    Ct Angio Chest Aorta W &/or Wo Contrast  Result Date: 10/15/2016 CLINICAL DATA:  Followup aneurysm. EXAM: CT ANGIOGRAPHY CHEST WITH CONTRAST TECHNIQUE: Multidetector CT imaging of the chest was performed using the standard protocol during bolus administration of intravenous contrast. Multiplanar CT  image reconstructions and MIPs were obtained to evaluate the vascular anatomy. CONTRAST:  75 cc Isovue Creatinine was obtained on site at [place] at [address] Results: Creatinine 1.1 mg/dL. COMPARISON:  CT 02/13/2016 FINDINGS: Cardiovascular: Using the same axial measuring convention as comparison CT ascending thoracic aorta measures 47 by 48 mm compared to 49 x 47 mm for no interval change. On sagittal imaging aorta measures 45 mm (image 87 of series 6) compared with 45 mm for no change Great vessels are normal. Minimal intimal calcification. The ascending thoracic aorta is normal caliber. Rare reduced are normal. No pericardial fluid. Mediastinum/Nodes: No axillary supraclavicular adenopathy. No mediastinal hilar adenopathy. No pericardial fluid. Lungs/Pleura: No suspicious pulmonary nodularity. Airways are normal. Upper Abdomen: Limited view of  the liver, kidneys, pancreas are unremarkable. Normal adrenal glands. Musculoskeletal: Degenerate spurring of the spine. Review of the MIP images confirms the above findings. IMPRESSION: 1. No change in diameter of fusiform ascending aorta aneurysm. 2. No acute cardiopulmonary findings. Electronically Signed   By: Suzy Bouchard M.D.   On: 10/15/2016 11:09    Assessment & Plan:   Problem List Items Addressed This Visit    Obesity - Primary    I have addressed  BMI and recommended wt loss of 10% of body weight over the next 6 months using a low glycemic index diet and regular exercise a minimum of 5 days per week.  Referral to [place] requested       Relevant Orders   Ambulatory referral to Nutrition and Diabetic Education   Insomnia secondary to anxiety    Managed with trazodone       Hyperlipidemia    Managed with low dose atorvastatin and low fat diet. .  Will return for fasting labs  Lab Results  Component Value Date   CHOL 116 12/25/2016   HDL 50 12/25/2016   LDLCALC 39 12/25/2016   TRIG 134 12/25/2016   CHOLHDL  3 05/20/2016           Ascending aortic aneurysm (HCC)    S/p evaluation by CTS.  Unchanged by follow up CT Nov 2017         I am having Mr. Martinezlopez maintain his Lactase (LACTAID PO), loratadine, esomeprazole, polyethylene glycol, dofetilide, warfarin, traMADol, atorvastatin, tadalafil, lisinopril, traZODone, and amLODipine.  No orders of the defined types were placed in this encounter.   There are no discontinued medications.  Follow-up: No Follow-up on file.   Crecencio Mc, MD

## 2017-04-11 NOTE — Assessment & Plan Note (Signed)
Managed with trazodone

## 2017-04-11 NOTE — Assessment & Plan Note (Signed)
I have addressed  BMI and recommended wt loss of 10% of body weight over the next 6 months using a low glycemic index diet and regular exercise a minimum of 5 days per week.  Referral to [place] requested

## 2017-04-11 NOTE — Assessment & Plan Note (Signed)
S/p evaluation by CTS.  Unchanged by follow up CT Nov 2017

## 2017-04-11 NOTE — Assessment & Plan Note (Signed)
Managed with low dose atorvastatin and low fat diet. .  Will return for fasting labs  Lab Results  Component Value Date   CHOL 116 12/25/2016   HDL 50 12/25/2016   LDLCALC 39 12/25/2016   TRIG 134 12/25/2016   CHOLHDL 3 05/20/2016

## 2017-04-13 ENCOUNTER — Telehealth: Payer: Self-pay

## 2017-04-13 MED ORDER — AZELASTINE-FLUTICASONE 137-50 MCG/ACT NA SUSP: 1.0000 | Freq: Two times a day (BID) | NASAL | 3 refills | Status: DC

## 2017-04-13 NOTE — Telephone Encounter (Signed)
Pt would like his nasal spray sent to [facility]

## 2017-04-14 ENCOUNTER — Telehealth: Payer: Self-pay | Admitting: Internal Medicine

## 2017-04-14 NOTE — Telephone Encounter (Signed)
Dymista was approved for 1  Year per [employer] [place] [phone] .  Will fax confirmation of approval to [employer]

## 2017-04-14 NOTE — Telephone Encounter (Signed)
Informed [place] at [employer] that David [facility] has been approved. States they will call pt when ready for pick up. Nothing further needed.

## 2017-04-14 NOTE — Telephone Encounter (Signed)
initiated PA for Dymista through [facility].  Key: [id number]  Will await response.

## 2017-04-14 NOTE — Telephone Encounter (Signed)
Pharmacy calling stating the nasal spray we sent in is not covered  Would like some advise on this Please call back

## 2017-04-27 ENCOUNTER — Other Ambulatory Visit: Payer: Self-pay | Admitting: *Deleted

## 2017-04-27 ENCOUNTER — Telehealth: Payer: Self-pay | Admitting: Internal Medicine

## 2017-04-27 DIAGNOSIS — I712 Thoracic aortic aneurysm, without rupture, unspecified: Secondary | ICD-10-CM

## 2017-04-27 DIAGNOSIS — E559 Vitamin D deficiency, unspecified: Secondary | ICD-10-CM

## 2017-04-27 NOTE — Telephone Encounter (Signed)
Pt is seeing a nutritionist and he needs to have his Victim D checked. He is requesting to have labs done.

## 2017-04-27 NOTE — Telephone Encounter (Signed)
Last labs were done in February by our office a vitamin D was not checked, please advise.  All your standard yearly ones were checked.

## 2017-04-27 NOTE — Telephone Encounter (Signed)
He can wait until he is due for labs again in August or he can come by now for just one lab.  His choice

## 2017-04-28 ENCOUNTER — Other Ambulatory Visit (INDEPENDENT_AMBULATORY_CARE_PROVIDER_SITE_OTHER): Payer: Medicare Other

## 2017-04-28 DIAGNOSIS — E559 Vitamin D deficiency, unspecified: Secondary | ICD-10-CM | POA: Diagnosis not present

## 2017-04-28 LAB — VITAMIN D 25 HYDROXY (VIT D DEFICIENCY, FRACTURES): VITD: 34.57 ng/mL (ref 30.00–100.00)

## 2017-04-28 NOTE — Telephone Encounter (Signed)
Left a detailed message that he can have jsut the vitamin D or wait till August with the other labs.

## 2017-05-05 ENCOUNTER — Ambulatory Visit (INDEPENDENT_AMBULATORY_CARE_PROVIDER_SITE_OTHER): Payer: Medicare Other | Admitting: *Deleted

## 2017-05-05 DIAGNOSIS — I4891 Unspecified atrial fibrillation: Secondary | ICD-10-CM | POA: Diagnosis not present

## 2017-05-05 LAB — POCT INR: INR: 2.9

## 2017-05-07 ENCOUNTER — Ambulatory Visit (INDEPENDENT_AMBULATORY_CARE_PROVIDER_SITE_OTHER): Payer: Medicare Other | Admitting: Psychology

## 2017-05-07 DIAGNOSIS — F4323 Adjustment disorder with mixed anxiety and depressed mood: Secondary | ICD-10-CM

## 2017-05-10 ENCOUNTER — Telehealth: Payer: Self-pay

## 2017-05-10 NOTE — Telephone Encounter (Signed)
NTD if bite is not getting larger and there is no redness, he can use benadryl ointment for the itching

## 2017-05-10 NOTE — Telephone Encounter (Signed)
Patient is using benadryl and it is helping. He was just more concerned due him going out of town for the next two weeks.

## 2017-05-10 NOTE — Telephone Encounter (Signed)
Patient came in to have bug bite looked at.  Patient stated he had a spider bite inside right leg above knee.  He noticed the bite late Saturday afternoon. Patient was doing yard work all that day and also wore jeans while working.  Patient denies fever, chills, nausea, vomiting, diarrhea,and night sweats.  The bug bite is a raised bump, itches and has not gotten bigger in size. No other SX.  Due to not having appointments here in clinic today I informed patient that he can go to [place]/Urgent care.  Patient stated that he will go to a walk in if SX become worse.  Patient stated he will keep [place] in the loop if he goes to Urgent Care. Please advise.  Bp- 122/60 BPM-61 Temp-98.6 SpO2%-96%

## 2017-05-31 ENCOUNTER — Other Ambulatory Visit: Payer: Self-pay | Admitting: Internal Medicine

## 2017-06-02 ENCOUNTER — Ambulatory Visit (INDEPENDENT_AMBULATORY_CARE_PROVIDER_SITE_OTHER): Payer: Medicare Other | Admitting: *Deleted

## 2017-06-02 DIAGNOSIS — I4891 Unspecified atrial fibrillation: Secondary | ICD-10-CM

## 2017-06-02 LAB — POCT INR: INR: 2.4

## 2017-06-03 ENCOUNTER — Ambulatory Visit (INDEPENDENT_AMBULATORY_CARE_PROVIDER_SITE_OTHER): Payer: Medicare Other | Admitting: Cardiothoracic Surgery

## 2017-06-03 ENCOUNTER — Encounter: Payer: Self-pay | Admitting: Cardiothoracic Surgery

## 2017-06-03 ENCOUNTER — Ambulatory Visit (INDEPENDENT_AMBULATORY_CARE_PROVIDER_SITE_OTHER): Payer: Medicare Other | Admitting: Psychology

## 2017-06-03 ENCOUNTER — Ambulatory Visit
Admission: RE | Admit: 2017-06-03 | Discharge: 2017-06-03 | Disposition: A | Discharge status: Home / Self Care | Payer: Medicare Other | Source: Ambulatory Visit | Attending: Cardiothoracic Surgery | Admitting: Cardiothoracic Surgery

## 2017-06-03 VITALS — BP 127/75 | HR 60 | Resp 16 | Ht 74.0 in | Wt 224.0 lb

## 2017-06-03 DIAGNOSIS — F4323 Adjustment disorder with mixed anxiety and depressed mood: Secondary | ICD-10-CM | POA: Diagnosis not present

## 2017-06-03 DIAGNOSIS — I712 Thoracic aortic aneurysm, without rupture, unspecified: Secondary | ICD-10-CM

## 2017-06-03 IMAGING — CT CT ANGIO CHEST
2 of 5 series · 8 of 30 positions shown · IV contrast (75CC ISOVUE 370)
Comparison: [DATE]

CLINICAL DATA: Followup thoracic aortic aneurysm

EXAM:
CT ANGIOGRAPHY CHEST WITH CONTRAST
TECHNIQUE: Multidetector CT imaging of the chest was performed using the
standard protocol during bolus administration of intravenous
contrast. Multiplanar CT image reconstructions and MIPs were
obtained to evaluate the vascular anatomy.
CONTRAST:  75 mL Isovue 370.
Creatinine was obtained on site at [HOSPITAL] at [REDACTED].Results: Creatinine 1.1 mg/dL.

[Series 3: angio · axial · 0.79mm/px · z∈[-258,-98]mm · 3 of 129 slices shown]
[im 33/129  lung]
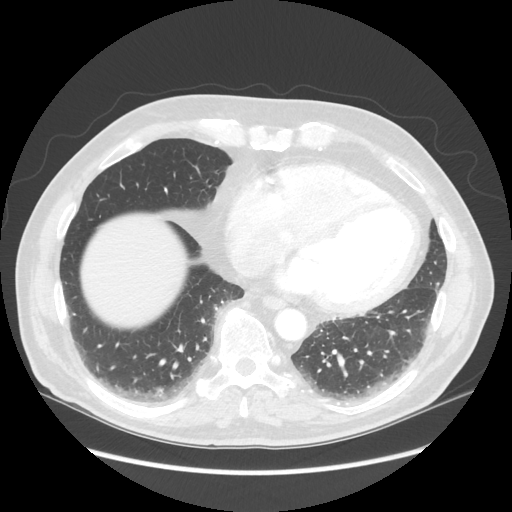
[im 65/129  mediastinal]
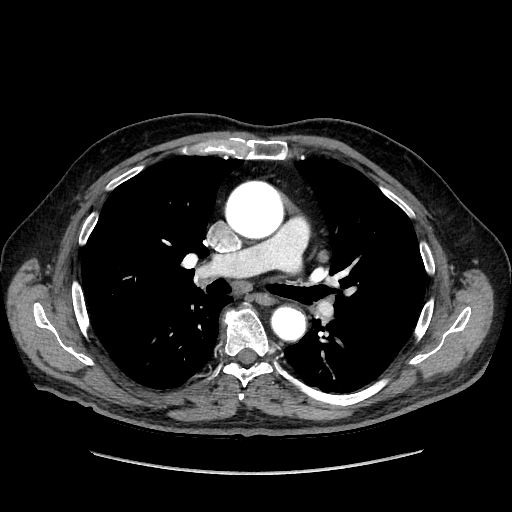
[im 97/129  lung]
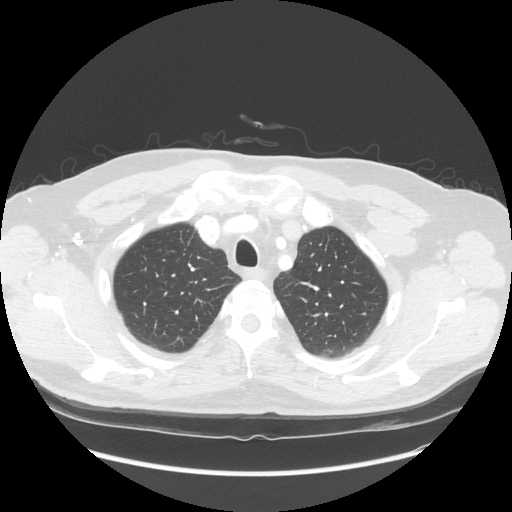

[Series 602: sagittal body · sagittal · 0.79mm/px · 5 of 164 slices shown]
[im 28/164  lung]
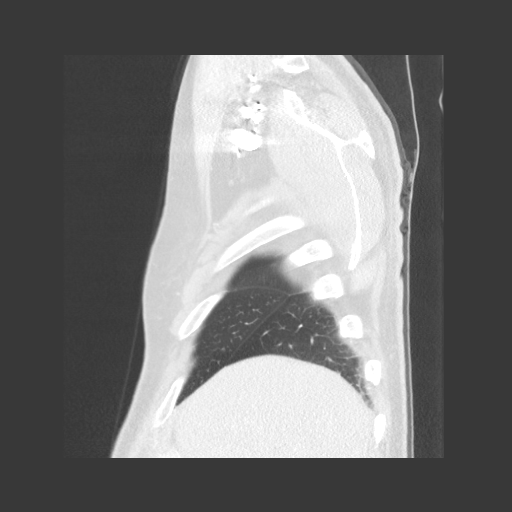
[im 55/164  lung]
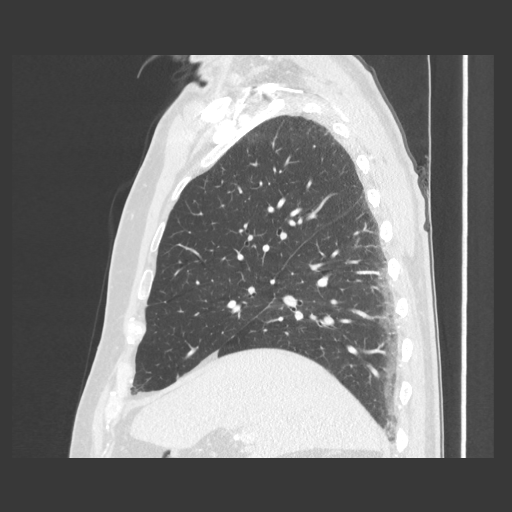
[im 82/164  lung]
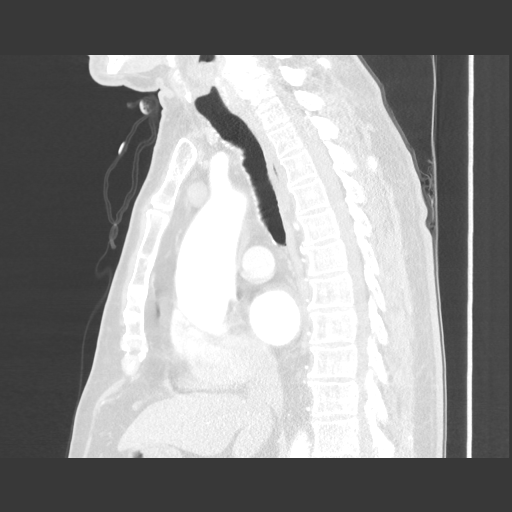
[im 109/164  lung]
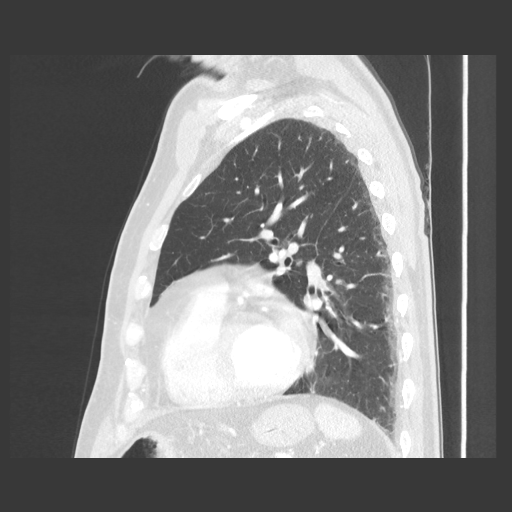
[im 136/164  lung]
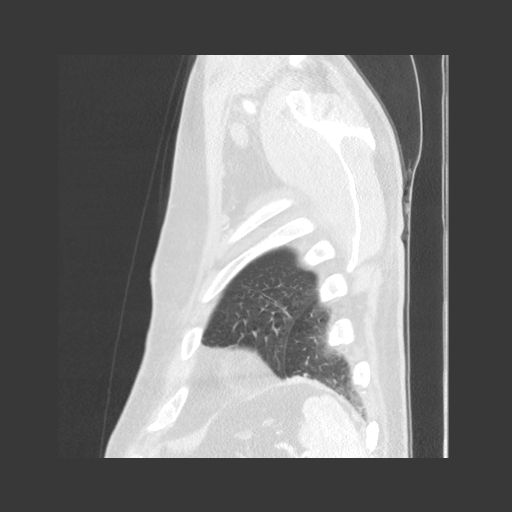

[8 of 30 positions shown; findings below may reference images not displayed]

FINDINGS: Cardiovascular: The ascending thoracic aorta is again prominent
measuring 4.8 x 4.7 cm in similar measurement technique as on the
prior exam. It measures approximately 4.2 cm at the ZEUS LEE
junction although evaluation is somewhat limited due to the patient
pulsatile artifact. No evidence of dissection is seen. Mild
atherosclerotic calcifications are noted. No significant coronary
calcifications are seen. Normal tapering is noted within the aortic
arch in the descending thoracic aorta appears within normal limits.
The pulmonary artery as visualized is within normal limits without
evidence of pulmonary embolism. No significant coronary
calcifications are seen. The heart is at the upper limits of normal
in size. Brachiocephalic vessels are within normal limits.

Mediastinum/Nodes: The thoracic inlet is within normal limits. No
significant hilar or mediastinal adenopathy is noted. Scattered
small mediastinal lymph nodes are again seen and stable. The
esophagus as visualized is within normal limits.

Lungs/Pleura: The lungs are well aerated bilaterally. No focal
infiltrate or sizable effusion is seen. No pneumothorax is
identified. No significant nodularity is identified. No bronchial
abnormality is seen.

Upper Abdomen: Visualized upper abdomen demonstrates fatty
infiltration of the liver.

Musculoskeletal: Degenerative changes of the thoracic spine are
again seen.

Review of the MIP images confirms the above findings.
IMPRESSION: Stable Ascending thoracic aortic aneurysm. Recommend semi-annual
imaging followup by CTA or MRA. This recommendation follows [3N]
ACCF/AHA/AATS/ACR/ASA/SCA/ZEUS LEE/ZEUS LEE/ZEUS LEE/ZEUS LEE Guidelines for the
Diagnosis and Management of Patients With Thoracic Aortic Disease.
Circulation. [3N]; 121: [id number]

No acute abnormality is identified.

Aortic aneurysm NOS ([3N]-[3N]).

Aortic Atherosclerosis ([3N]-[3N]).

## 2017-06-03 MED ORDER — IOPAMIDOL (ISOVUE-370) INJECTION 76%
75.0000 mL | Freq: Once | INTRAVENOUS | Status: AC | PRN
  Administered 2017-06-03: 75 mL via INTRAVENOUS

## 2017-06-03 NOTE — Patient Instructions (Signed)

## 2017-06-03 NOTE — Progress Notes (Signed)
[address]       [place] [address]             [phone]                    Usman B Preziosi Babbitt Medical Record #[id number] Date of Birth: [birth year]  Referring: [name], MD Primary Care: Crecencio Mc, MD  Chief Complaint:    Chief Complaint  Patient presents with  . Thoracic Aortic Aneurysm    8 month f/u with Chest CTA    History of Present Illness:    David Rice 71 y.o. male  returns to the office today with a follow-up CT scan of the chest to evaluate his known dilated ascending aorta.   Dilated ascending aorta was first noted at cardiac catheterization done at [facility] in the summer of 2016 during the evaluation for atrial fibrillation and cardiac catheterization.   At the time of catheterization he was noted to have a dilated ascending aorta, and subsequently had a CTA of the chest abdomen and pelvis. 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 fib ablation procedure previously done. This pseudoaneurysm was occluded endovascularly at [facility] in September 2016.   Echo cardiogram basically showed a normal heart, the CT scan also found an incidental 4.7 cm ascending aortic dilatation.  The patient has no stigmata of Marfan's or other connective tissue disorder, he has no family history of early sudden death or known dissection or descending aneurysm. The patient's father died at age 60 with lung cancer was a long-term smoker, his mother is still alive and doing well at age 70, he has one brother with mental disorder but no known aneurysm he has 2 sons that are alive and healthy and well.His maternal grandfather died of a ruptured abdominal aneurysm   Current Activity/ Functional Status:  Patient is independent with mobility/ambulation, transfers, ADL's, IADL's.   Zubrod Score: At the time of surgery this patient's most appropriate activity  status/ level should be described as: []     0    Normal activity, no symptoms []     1    Restricted in physical strenuous activity but ambulatory, able to do out light work []     2    Ambulatory and capable of self care, unable to do work activities, up and about               >50 % of waking hours                              []     3    Only limited self care, in bed greater than 50% of waking hours []     4    Completely disabled, no self care, confined to bed or chair []     5    Moribund   Past Medical History:  Diagnosis Date  . AAA (abdominal aortic aneurysm) ([place])   . Aorta aneurysm ([place])    "@ the valve; 4.5cm; [name] is watching it" (02/18/2016)  . Arthritis    "spine, shoulders, legs" (02/18/2016)  . Bowel obstruction ([place])   . Chronic lower back pain   . Diverticulitis   . Essential hypertension   . GERD (gastroesophageal reflux disease)   . Hearing impaired   . Hyperlipidemia   .  Hypertension   . On home oxygen therapy    "don't know how much; use it at night" (02/18/2016)  . OSA on CPAP   . Persistent atrial fibrillation [facility])    Status post ablation in July 2016 in [place].  . Thoracic aortic aneurysm without rupture [facility])     Past Surgical History:  Procedure Laterality Date  . CARDIAC CATHETERIZATION  11/2014   [place]  . CARDIAC ELECTROPHYSIOLOGY MAPPING AND ABLATION  05/2015  . COLECTOMY  11/10/2013   "diverticulitis"  . COLON SURGERY    . INGUINAL HERNIA REPAIR Left 1978  . NASAL SINUS SURGERY Left 04/1987  . PERIPHERAL VASCULAR CATHETERIZATION N/A 08/02/2015   Procedure: Abdominal Aortogram w/Lower Extremity;  Surgeon: Katha Cabal, MD;  Location: [place] [facility];  Service: Cardiovascular;  Laterality: N/A;  . PERIPHERAL VASCULAR CATHETERIZATION  08/02/2015   Procedure: Lower Extremity Intervention;  Surgeon: Katha Cabal, MD;  Location: [place] [facility];  Service: Cardiovascular;;  . TEE WITHOUT CARDIOVERSION N/A  02/17/2016   Procedure: TRANSESOPHAGEAL ECHOCARDIOGRAM (TEE);  Surgeon: Sanda Klein, MD;  Location: [place];  Service: Cardiovascular;  Laterality: N/A;  . TONSILLECTOMY  1950  . WISDOM TOOTH EXTRACTION  1979    Family History  Problem Relation Age of Onset  . Hypertension Mother        Living 69  . Lung cancer Father        Deceased, 29  . Hypertension Brother   . Healthy Son   . Healthy Daughter     Social History  Patient's wife died at age 58, he raised 2 small children on his own for the next 74 years Social History  . Marital Status: Single    Spouse Name: N/A  . Number of Children: N/A  . Years of Education: N/A   Occupational History  .  patient retired as [employer]    Social History Main Topics  . Smoking status: Never Smoker   . Smokeless tobacco: Not on file  . Alcohol Use: 1.2 oz/week    2 Glasses of wine per week  . Drug Use: No  . Sexual Activity: Not on file   Other Topics Concern  . Not on file   Social History Narrative    History  Smoking Status  . Never Smoker  Smokeless Tobacco  . Never Used    History  Alcohol Use No     Allergies  Allergen Reactions  . Lactose Intolerance (Gi)     Current Outpatient Prescriptions  Medication Sig Dispense Refill  . amLODipine (NORVASC) 5 MG tablet Take 1 tablet (5 mg total) by mouth daily. 90 tablet 0  . atorvastatin (LIPITOR) 10 MG tablet Take 1 tablet (10 mg total) by mouth daily. 90 tablet 3  . dofetilide (TIKOSYN) 250 MCG capsule Take 1 capsule (250 mcg total) by mouth 2 (two) times daily. 180 capsule 3  . esomeprazole (NEXIUM) 20 MG capsule Take 20 mg by mouth as needed.     . Lactase (LACTAID PO) Take by mouth. Take  1 capsule twice daily.    Marland Kitchen lisinopril (PRINIVIL,ZESTRIL) 10 MG tablet Take 1 tablet (10 mg total) by mouth daily. 30 tablet 5  . loratadine (CLARITIN) 10 MG tablet Take 10 mg by mouth 2 (two) times daily.     . polyethylene glycol (MIRALAX / GLYCOLAX) packet Take 17 g by mouth  daily as needed.    . tadalafil (CIALIS) 5 MG tablet Take 5 mg  by mouth daily.    . traMADol (ULTRAM) 50 MG tablet Take 1 tablet (50 mg total) by mouth every 6 (six) hours as needed. (Patient taking differently: Take 50 mg by mouth every 6 (six) hours as needed. Rarely uses) 90 tablet 2  . traZODone (DESYREL) 50 MG tablet 1/2 to 1 tablet by mouth One hour prior to bedtime 90 tablet 1  . warfarin (COUMADIN) 5 MG tablet Take as directed by [facility] 50 tablet 3   No current facility-administered medications for this visit.       Review of Systems:     Cardiac Review of Systems: Y or N  Chest Pain [  n  ]  Resting SOB [ n  ] Exertional SOB  Blue.Reese  ]  Orthopnea n[  ]   Pedal Edema [ y  ]    Palpitations Blue.Reese  ] Syncope  [ n ]   Presyncope [n   ]  General Review of Systems: [Y] = yes [  ]=no Constitional: recent weight change [  ];  Wt loss over the last 3 months [   ] anorexia [  ]; fatigue [ x ]; nausea [  ]; night sweats [  ]; fever [  ]; or chills [  ];          Dental: poor dentition[  ]; Last Dentist visit:   Eye : blurred vision [  ]; diplopia [   ]; vision changes [  ];  Amaurosis fugax[  ]; Resp: cough [  ];  wheezing[  ];  hemoptysis[  ]; shortness of breath[  ]; paroxysmal nocturnal dyspnea[  ]; dyspnea on exertion[  ]; or orthopnea[  ];  GI:  gallstones[  ], vomiting[  ];  dysphagia[  ]; melena[  ];  hematochezia [  ]; heartburn[  ];   Hx of  Colonoscopy[  ]; GU: kidney stones [  ]; hematuria[  ];   dysuria [  ];  nocturia[  ];  history of     obstruction [  ]; urinary frequency [  ]             Skin: rash, swelling[  ];, hair loss[  ];  peripheral edema[  ];  or itching[  ]; Musculosketetal: myalgias[  ];  joint swelling[  ];  joint erythema[  ];  joint pain[  ];  back pain[  ];  Heme/Lymph: bruising[  ];  bleeding[  ];  anemia[  ];  Neuro: TIA[  ];  headaches[  ];  stroke[  ];  vertigo[  ];  seizures[  ];   paresthesias[  ];  difficulty walking[n  ]; with the medication for atrial  fibrillation the patient had significant problems with tremor to the point he was unable to use hand tools or power this has improved lately  Psych:depression[  ]; anxiety[  ];  Endocrine: diabetes[  ];  thyroid dysfunction[  ];  Immunizations: Flu up to date [  ]; Pneumococcal up to date [  ];  Other:  Physical Exam: BP 127/75 (BP Location: Left Arm, Patient Position: Sitting, Cuff Size: Large)   Pulse 60   Resp 16   Ht 6\' 2"  (1.88 m)   Wt 224 lb (101.6 kg)   SpO2 98% Comment: RA  BMI 28.76 kg/m   PHYSICAL EXAMINATION: General appearance: alert, cooperative, appears stated age and no distress Head: Normocephalic, without obvious abnormality, atraumatic Neck: no adenopathy, no carotid bruit, no JVD, supple,  symmetrical, trachea midline and thyroid not enlarged, symmetric, no tenderness/mass/nodules Lymph nodes: Cervical, supraclavicular, and axillary nodes normal. Resp: clear to auscultation bilaterally Back: symmetric, no curvature. ROM normal. No CVA tenderness. Cardio: regular rate and rhythm, S1, S2 normal, no murmur, click, rub or gallop GI: soft, non-tender; bowel sounds normal; no masses,  no organomegaly Extremities: extremities normal, atraumatic, no cyanosis or edema and Homans sign is negative, no sign of DVT Neurologic: Grossly normal Mild tremor in his hands  No palpable abdominal aneurysm  Palpable DP and PT pulses bilaterally   Diagnostic Studies & Laboratory data:     Recent Radiology Findings: Ct Angio Chest Aorta W &/or Wo Contrast  Result Date: 06/03/2017 CLINICAL DATA:  Followup thoracic aortic aneurysm EXAM: CT ANGIOGRAPHY CHEST WITH CONTRAST TECHNIQUE: Multidetector CT imaging of the chest was performed using the standard protocol during bolus administration of intravenous contrast. Multiplanar CT image reconstructions and MIPs were obtained to evaluate the vascular anatomy. CONTRAST:  75 mL Isovue 370. Creatinine was obtained on site at [place] at  [address]Results: Creatinine 1.1 mg/dL. COMPARISON:  10/15/2016 FINDINGS: Cardiovascular: The ascending thoracic aorta is again prominent measuring 4.8 x 4.7 cm in similar measurement technique as on the prior exam. It measures approximately 4.2 cm at the sino-tubular junction although evaluation is somewhat limited due to the patient pulsatile artifact. No evidence of dissection is seen. Mild atherosclerotic calcifications are noted. No significant coronary calcifications are seen. Normal tapering is noted within the aortic arch in the descending thoracic aorta appears within normal limits. The pulmonary artery as visualized is within normal limits without evidence of pulmonary embolism. No significant coronary calcifications are seen. The heart is at the upper limits of normal in size. Brachiocephalic vessels are within normal limits. Mediastinum/Nodes: The thoracic inlet is within normal limits. No significant hilar or mediastinal adenopathy is noted. Scattered small mediastinal lymph nodes are again seen and stable. The esophagus as visualized is within normal limits. Lungs/Pleura: The lungs are well aerated bilaterally. No focal infiltrate or sizable effusion is seen. No pneumothorax is identified. No significant nodularity is identified. No bronchial abnormality is seen. Upper Abdomen: Visualized upper abdomen demonstrates fatty infiltration of the liver. Musculoskeletal: Degenerative changes of the thoracic spine are again seen. Review of the MIP images confirms the above findings. IMPRESSION: Stable Ascending thoracic aortic aneurysm. Recommend semi-annual imaging followup by CTA or MRA. This recommendation follows 2010 ACCF/AHA/AATS/ACR/ASA/SCA/SCAI/SIR/STS/SVM Guidelines for the Diagnosis and Management of Patients With Thoracic Aortic Disease. Circulation. 2010; 121: Q595-G387 No acute abnormality is identified. Aortic aneurysm NOS (ICD10-I71.9). Aortic Atherosclerosis (ICD10-I70.0).  Electronically Signed   By: Inez Catalina M.D.   On: 06/03/2017 13:09   Ct Angio Chest Aorta W &/or Wo Contrast  Result Date: 10/15/2016 CLINICAL DATA:  Followup aneurysm. EXAM: CT ANGIOGRAPHY CHEST WITH CONTRAST TECHNIQUE: Multidetector CT imaging of the chest was performed using the standard protocol during bolus administration of intravenous contrast. Multiplanar CT image reconstructions and MIPs were obtained to evaluate the vascular anatomy. CONTRAST:  75 cc Isovue Creatinine was obtained on site at [place] at [address] Results: Creatinine 1.1 mg/dL. COMPARISON:  CT 02/13/2016 FINDINGS: Cardiovascular: Using the same axial measuring convention as comparison CT ascending thoracic aorta measures 47 by 48 mm compared to 49 x 47 mm for no interval change. On sagittal imaging aorta measures 45 mm (image 87 of series 6) compared with 45 mm for no change Great vessels are normal. Minimal intimal calcification. The ascending  thoracic aorta is normal caliber. Rare reduced are normal. No pericardial fluid. Mediastinum/Nodes: No axillary supraclavicular adenopathy. No mediastinal hilar adenopathy. No pericardial fluid. Lungs/Pleura: No suspicious pulmonary nodularity. Airways are normal. Upper Abdomen: Limited view of the liver, kidneys, pancreas are unremarkable. Normal adrenal glands. Musculoskeletal: Degenerate spurring of the spine. Review of the MIP images confirms the above findings. IMPRESSION: 1. No change in diameter of fusiform ascending aorta aneurysm. 2. No acute cardiopulmonary findings. Electronically Signed   By: Suzy Bouchard M.D.   On: 10/15/2016 11:09    Ct Angio Chest W/cm &/or Wo Cm  02/13/2016  CLINICAL DATA:  Followup thoracic aortic aneurysm. Chest pain and shortness of breath. EXAM: CT ANGIOGRAPHY CHEST WITH CONTRAST TECHNIQUE: Multidetector CT imaging of the chest was performed using the standard protocol during bolus administration of intravenous contrast.  Multiplanar CT image reconstructions and MIPs were obtained to evaluate the vascular anatomy. CONTRAST:  75 cc Isovue 370 COMPARISON:  07/28/2015 FINDINGS: Mediastinum/Nodes: No chest wall mass, supraclavicular or axillary lymphadenopathy. Small scattered lymph nodes. The thyroid gland is grossly normal. Mild stable cardiac enlargement. No pericardial effusion. Stable fusiform aneurysm on the attachment of the ascending aorta with maximal measurement of 48 x 47 mm. No dissection. The pulmonary arteries appear normal. No mediastinal or hilar mass or adenopathy. Stable small scattered lymph nodes. Esophagus is grossly normal. Lungs/Pleura: No acute pulmonary findings. Dependent subpleural atelectasis is noted. No worrisome pulmonary lesions. No bronchiectasis or interstitial lung disease. Upper abdomen: No significant findings. Musculoskeletal: No significant findings. Moderate stable degenerative changes involving the thoracic spine. Review of the MIP images confirms the above findings. IMPRESSION: 1. Stable fusiform aneurysmal dilatation of the ascending aorta with maximal measurement of 48 x 47 mm. No dissection. Ascending thoracic aortic aneurysm. Recommend semi-annual imaging followup by CTA or MRA and referral to cardiothoracic surgery if not already obtained. This recommendation follows 2010 ACCF/AHA/AATS/ACR/ASA/SCA/SCAI/SIR/STS/SVM Guidelines for the Diagnosis and Management of Patients With Thoracic Aortic Disease. Circulation. 2010; 121: [id number] 2. Stable mild cardiac enlargement. 3. No acute or significant pulmonary findings. Electronically Signed   By: Marijo Sanes M.D.   On: 02/13/2016 10:53       CLINICAL DATA: Acute left-sided chest and back pain. History of aortic aneurysm.  EXAM: CT ANGIOGRAPHY CHEST, ABDOMEN AND PELVIS  TECHNIQUE: Multidetector CT imaging through the chest, abdomen and pelvis was performed using the standard protocol during bolus administration of intravenous  contrast. Multiplanar reconstructed images and MIPs were obtained and reviewed to evaluate the vascular anatomy.  CONTRAST: 140mL OMNIPAQUE IOHEXOL 350 MG/ML SOLN  COMPARISON: CT scan of June 25, 2013.  FINDINGS: CTA CHEST FINDINGS  No pneumothorax or significant pleural effusion is noted. No acute pulmonary disease is noted. There is no evidence of thoracic aortic dissection. Great vessels are widely patent. 4.8 cm ascending thoracic aortic aneurysm is noted. No mediastinal mass or adenopathy is noted. Pulmonary arteries appear normal. No significant osseous abnormality is noted in the chest.  Review of the MIP images confirms the above findings.  CTA ABDOMEN AND PELVIS FINDINGS  No gallstones are noted. No definite abnormality seen involving the liver, spleen or pancreas. Adrenal glands and kidneys appear normal. No hydronephrosis or renal obstruction is noted. No renal or ureteral calculi are noted. The appendix appears normal. There is no evidence of bowel obstruction. Atherosclerosis of abdominal aorta is noted without dissection. The celiac, superior mesenteric and renal arteries are widely patent. 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 3.1 cm  infrarenal abdominal aortic aneurysm is noted. Iliac arteries are widely patent without significant stenosis. Moderate prostatic enlargement is noted. Urinary bladder appears normal. 4.1 cm pseudo aneurysm with a large amount of mural thrombus is seen arising from a proximal branch of the proximal left superficial femoral artery. Multilevel degenerative disc disease is noted in the lumbar spine.  Review of the MIP images confirms the above findings.  IMPRESSION: No evidence of thoracic aortic dissection.  4.8 cm ascending thoracic aortic aneurysm is noted. Ascending thoracic aortic aneurysm. Recommend semi-annual imaging followup by CTA or MRA and referral to cardiothoracic surgery if not  already obtained. This recommendation follows 2010 ACCF/AHA/AATS/ACR/ASA/SCA/SCAI/SIR/STS/SVM Guidelines for the Diagnosis and Management of Patients With Thoracic Aortic Disease. Circulation. 2010; 121: Q595-G387.  No evidence of abdominal aortic dissection. 3.1 cm infrarenal abdominal aortic aneurysm is noted.  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 There appears to be early filling of the left common femoral vein with contrast, most likely due to this pseudoaneurysm. Consultation with vascular surgery is recommended. Critical Value/emergent results were called by telephone at the time of interpretation on 07/28/2015 at 6:11 pm to Dr. Lisa Roca , who verbally acknowledged these results.   Electronically Signed  By: Marijo Conception, M.D.  On: 07/28/2015 18:13  I have independently reviewed the above radiology studies  and reviewed the findings with the patient.  ECHO:  Transesophageal Echocardiography  Patient:    Stevin, Bielinski MR #:       [id number] Study Date: 02/17/2016 Gender:     M Age:        5 Height:     614.7 cm Weight:     33.6 kg BSA:        2.05 m^2 Pt. Status: Room:   ATTENDING    Jolyn Nap, M.D.  ADMITTING    Kirk Ruths  PERFORMING   Sanda Klein, MD  SONOGRAPHER  Johny Chess, RDCS, CCT  Rodman Key  cc:  ------------------------------------------------------------------- LV EF: 55% -   60%  ------------------------------------------------------------------- Indications:      Atrial fibrillation - 427.31.  ------------------------------------------------------------------- Study Conclusions  - Left ventricle: The cavity size was normal. There was mild   concentric hypertrophy. Systolic function was normal. The   estimated ejection fraction was in the range of 55% to 60%. Wall   motion was normal; there were no regional wall motion    abnormalities. - Aortic valve: No evidence of vegetation. No evidence of   vegetation. There was mild regurgitation. - Mitral valve: Mild, late systolicprolapse, involving the   posterior leaflet. No evidence of vegetation. There was mild   regurgitation. - Left atrium: No evidence of thrombus in the atrial cavity or   appendage. No spontaneous echo contrast was observed. - Right atrium: No evidence of thrombus in the atrial cavity or   appendage. No evidence of thrombus in the atrial cavity or   appendage. - Atrial septum: No defect or patent foramen ovale was identified. - Pulmonic valve: There was mild to moderate regurgitation directed   eccentrically.  Diagnostic transesophageal echocardiography.  2D and color Doppler.  Birthdate:  Patient birthdate: 08-01-1946.  Age:  Patient is 71 yr old.  Sex:  Gender: male.    BMI: 0.9 kg/m^2.  Blood pressure: 150/98  Patient status:  Outpatient.  Study date:  Study date: 02/17/2016. Study time: 11:31 AM.  Location:  Endoscopy.  -------------------------------------------------------------------  ------------------------------------------------------------------- Left ventricle:  The cavity size was normal. There was mild concentric hypertrophy. Systolic function was normal. The estimated ejection fraction was in the range of 55% to 60%. Wall motion was normal; there were no regional wall motion abnormalities.  ------------------------------------------------------------------- Aortic valve:   Structurally normal valve. Trileaflet. Cusp separation was normal.  No evidence of vegetation.  No evidence of vegetation.  Doppler:  There was mild regurgitation.  ------------------------------------------------------------------- Aorta:  Aortic root: The aortic root was mildly dilated. Ascending aorta: The ascending aorta was mildly dilated.  ------------------------------------------------------------------- Mitral valve:  Leaflet  separation was normal.  Mild, late systolicprolapse, involving the posterior leaflet.  No evidence of vegetation.  Doppler:  There was mild regurgitation.  ------------------------------------------------------------------- Left atrium:  The atrium was at the upper limits of normal in size.  No evidence of thrombus in the atrial cavity or appendage. No spontaneous echo contrast was observed.  Emptying velocity was normal.  ------------------------------------------------------------------- Atrial septum:  No defect or patent foramen ovale was identified.   ------------------------------------------------------------------- Right ventricle:  The cavity size was normal. Systolic function was normal.  ------------------------------------------------------------------- Pulmonic valve:   Poorly visualized.  Doppler:  There was mild to moderate regurgitation directed eccentrically.  ------------------------------------------------------------------- Right atrium:  The atrium was normal in size.  No evidence of thrombus in the atrial cavity or appendage.  No evidence of thrombus in the atrial cavity or appendage.  ------------------------------------------------------------------- Pericardium:  There was no pericardial effusion.  ------------------------------------------------------------------- Measurements   Left ventricle                       Value    Reference  LV PW thickness, ED                  12.44 mm ---------    Aortic valve                         Value    Reference  Aortic annulus diameter, ED          23.87 mm 14 - 26    Aorta                                Value    Reference  Aortic root ID, ED                   30.26 mm ---------  Aortic root ID, M-L, ED              47.98 mm ---------  Aortic root ID, STJ, ED              39.43 mm ---------  Ascending aorta ID, A-P, mid, ED (H) 42.2  mm 21 - 34  Legend: (L)  and  (H)  mark values outside specified  reference range.  ------------------------------------------------------------------- Prepared and Electronically Authenticated by  Sanda Klein, MD 2017-04-03T12:00:56    Recent Lab Findings: Lab Results  Component Value Date   WBC 5.8 02/24/2017   HGB 15.5 02/24/2017   HCT 46.6 02/24/2017   PLT 174 02/24/2017   GLUCOSE 89 02/24/2017   CHOL 116 12/25/2016   TRIG 134 12/25/2016   HDL 50 12/25/2016   LDLCALC 39 12/25/2016   ALT 29 12/25/2016   AST 15 12/25/2016   NA 139 02/24/2017   K 4.2 02/24/2017   CL 102 02/24/2017   CREATININE 1.07 02/24/2017   BUN 13 02/24/2017  CO2 25 02/24/2017   TSH 1.40 12/25/2016   INR 2.4 06/02/2017   HGBA1C 5.6 12/25/2016   Aortic Size Index=    4.7     /Body surface area is 2.3 meters squared. =2.0 < 2.75 cm/m2      4% risk per year 2.75 to 4.25          8% risk per year > 4.25 cm/m2    20% risk per year     Assessment / Plan:   Incidental finding of dilated ascending aorta without evidence of aortic insufficiency, trileaflet aortic valve- I reviewed again with the patient signs and symptoms of aortic dissection, with current size intervention would not be recommended at this time. Patient does need good blood pressure control  I discussed with the patient not engage in excessive lifting such as competitive weightlifting he notes that he is heated this and avoided strenuous lifting,   According to the 2010 ACC/AHA guidelines, we recommend patients with thoracic aortic disease to maintain a LDL of less than 70 and a HDL of greater than 50.   We'll plan to see the patient back for follow-up CTA of chest  scan in 12 months   Patient was warned about not using Cipro and similar antibiotics. Recent studies have raised concern that fluoroquinolone antibiotics could be associated with an increased risk of aortic aneurysm Fluoroquinolones have non-antimicrobial properties that might jeopardise the integrity of the extracellular matrix of  the vascular wall In a  propensity score matched cohort study in [place], there was a 66% increased rate of aortic aneurysm or dissection associated with oral fluoroquinolone use, compared with amoxicillin use, within a 60 day risk period from start of treatment     Grace Isaac MD      [address] [place] [address] Office [phone]   Beeper [phone]  06/03/2017 1:23 PM

## 2017-06-07 ENCOUNTER — Ambulatory Visit (INDEPENDENT_AMBULATORY_CARE_PROVIDER_SITE_OTHER): Payer: Medicare Other | Admitting: Family

## 2017-06-07 VITALS — BP 126/64 | HR 62 | Temp 98.1°F | Ht 74.0 in | Wt 229.8 lb

## 2017-06-07 DIAGNOSIS — S70362A Insect bite (nonvenomous), left thigh, initial encounter: Secondary | ICD-10-CM

## 2017-06-07 DIAGNOSIS — W57XXXD Bitten or stung by nonvenomous insect and other nonvenomous arthropods, subsequent encounter: Secondary | ICD-10-CM

## 2017-06-07 NOTE — Progress Notes (Signed)
Subjective:    Patient ID: David Rice, male    DOB: 01-Aug-1946, 71 y.o.   MRN: [id number]  CC: David Rice is a 71 y.o. male who presents today for an acute visit.    HPI: CC: recheck bug bite x 6 weeks on left upper inner thigh. 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 Thinks 'spider bite.'  Red and itched a lot at that time. Redness about 2cm in diameter- all since resolved.  States 'wasn't a bulls eye.'  Hasn't pulled any ticks off.  Reports another bug bite left bicep x 3 weeks ago when [place]. NO one else in household reports bug bites.  Small 'dot' left under arm. No itching, purulent discharge.   No fever, joint pain, rash, HA.   On warfarin. No easy bruising or bleeding   Walk in 6/25 -Spider bite above right knee.  HISTORY:  Past Medical History:  Diagnosis Date  . AAA (abdominal aortic aneurysm) ([place])   . Aorta aneurysm ([place])    "@ the valve; 4.5cm; [name] is watching it" (02/18/2016)  . Arthritis    "spine, shoulders, legs" (02/18/2016)  . Bowel obstruction ([place])   . Chronic lower back pain   . Diverticulitis   . Essential hypertension   . GERD (gastroesophageal reflux disease)   . Hearing impaired   . Hyperlipidemia   . Hypertension   . On home oxygen therapy    "don't know how much; use it at night" (02/18/2016)  . OSA on CPAP   . Persistent atrial fibrillation [facility])    Status post ablation in July 2016 in [place].  . Thoracic aortic aneurysm without rupture [facility])    Past Surgical History:  Procedure Laterality Date  . CARDIAC CATHETERIZATION  11/2014   [place]  . CARDIAC ELECTROPHYSIOLOGY MAPPING AND ABLATION  05/2015  . COLECTOMY  11/10/2013   "diverticulitis"  . COLON SURGERY    . INGUINAL HERNIA REPAIR Left 1978  . NASAL SINUS SURGERY Left 04/1987  . PERIPHERAL VASCULAR CATHETERIZATION N/A 08/02/2015   Procedure: Abdominal Aortogram w/Lower Extremity;  Surgeon: Katha Cabal, MD;  Location: [place] [facility];  Service: Cardiovascular;  Laterality: N/A;  . PERIPHERAL VASCULAR CATHETERIZATION  08/02/2015   Procedure: Lower Extremity Intervention;  Surgeon: Katha Cabal, MD;  Location: [place] [facility];  Service: Cardiovascular;;  . TEE WITHOUT CARDIOVERSION N/A 02/17/2016   Procedure: TRANSESOPHAGEAL ECHOCARDIOGRAM (TEE);  Surgeon: Sanda Klein, MD;  Location: [place];  Service: Cardiovascular;  Laterality: N/A;  . TONSILLECTOMY  1950  . WISDOM TOOTH EXTRACTION  1979   Family History  Problem Relation Age of Onset  . Hypertension Mother        Living 66  . Lung cancer Father        Deceased, 70  . Hypertension Brother   . Healthy Son   . Healthy Daughter     Allergies: Lactose intolerance (gi) Current Outpatient Prescriptions on File Prior to Visit  Medication Sig Dispense Refill  . amLODipine (NORVASC) 5 MG tablet Take 1 tablet (5 mg total) by mouth daily. 90 tablet 0  . atorvastatin (LIPITOR) 10 MG tablet Take 1 tablet (10 mg total) by mouth daily. 90 tablet 3  . dofetilide (TIKOSYN) 250 MCG capsule Take 1 capsule (250 mcg total) by mouth 2 (two) times daily. 180 capsule 3  . esomeprazole (NEXIUM) 20 MG capsule Take 20 mg by mouth as needed.     Marland Kitchen  Lactase (LACTAID PO) Take by mouth. Take  1 capsule twice daily.    Marland Kitchen lisinopril (PRINIVIL,ZESTRIL) 10 MG tablet Take 1 tablet (10 mg total) by mouth daily. 30 tablet 5  . loratadine (CLARITIN) 10 MG tablet Take 10 mg by mouth 2 (two) times daily.     . polyethylene glycol (MIRALAX / GLYCOLAX) packet Take 17 g by mouth daily as needed.    . tadalafil (CIALIS) 5 MG tablet Take 5 mg by mouth daily.    . traMADol (ULTRAM) 50 MG tablet Take 1 tablet (50 mg total) by mouth every 6 (six) hours as needed. (Patient taking differently: Take 50 mg by mouth every 6 (six) hours as needed. Rarely uses) 90 tablet 2  . traZODone (DESYREL) 50 MG tablet 1/2 to 1 tablet by mouth One hour prior to bedtime 90 tablet 1  . warfarin (COUMADIN) 5 MG  tablet Take as directed by [facility] Clinic 50 tablet 3   No current facility-administered medications on file prior to visit.     Social History  Substance Use Topics  . Smoking status: Never Smoker  . Smokeless tobacco: Never Used  . Alcohol use No    Review of Systems  Constitutional: Negative for chills and fever.  Respiratory: Negative for cough.   Cardiovascular: Negative for chest pain, palpitations and leg swelling.  Gastrointestinal: Negative for abdominal distention, nausea and vomiting.  Musculoskeletal: Negative for arthralgias and myalgias.  Skin: Negative for rash and wound.  Neurological: Negative for headaches.      Objective:    BP 126/64   Pulse 62   Temp 98.1 F (36.7 C) (Oral)   Ht 6\' 2"  (1.88 m)   Wt 229 lb 12.8 oz (104.2 kg)   SpO2 98%   BMI 29.50 kg/m    Physical Exam  Constitutional: He appears well-developed and well-nourished.  Cardiovascular: Regular rhythm and normal heart sounds.   Pulmonary/Chest: Effort normal and breath sounds normal. No respiratory distress. He has no wheezes. He has no rhonchi. He has no rales.  Neurological: He is alert.  Skin: Skin is warm and dry.     Reported ( resolved) lesions marked on diagram. No rash or lesions appreciated right upper leg or inner left arm. No nodules, erythema, ecchymosis appreciated during exam. Skin intact.  Psychiatric: He has a normal mood and affect. His speech is normal and behavior is normal.  Vitals reviewed.      Assessment & Plan:   1. Insect bite, subsequent encounter I'm very pleased to see lesions on left leg and left arm have resolved at this time. Patient has no pain, itching; he denies feeling any ticks off. We had a long discussion about additional laboratory work, including lyme tiers, and how we jointly agreed very low suspicion for RMST or Lyme disease in the absence of pulling a tick off. Education and reassurance offered to patient. Advised him to stay vigilant and if  any new bug bites, symptoms were to appear, he will let us know immediately.     I am having [name] maintain his Lactase (LACTAID PO), loratadine, esomeprazole, polyethylene glycol, dofetilide, traMADol, atorvastatin, tadalafil, lisinopril, traZODone, amLODipine, and warfarin.   No orders of the defined types were placed in this encounter.   Return precautions given.   Risks, benefits, and alternatives of the medications and treatment plan prescribed today were discussed, and patient expressed understanding.   Education regarding symptom management and diagnosis given to patient on AVS.  Continue to follow  with Crecencio Mc, MD for routine health maintenance.   Shirlee Limerick and I agreed with plan.   Mable Paris, FNP

## 2017-06-07 NOTE — Progress Notes (Signed)
Pre visit review using our clinic review tool, if applicable. No additional management support is needed unless otherwise documented below in the visit note. 

## 2017-06-07 NOTE — Patient Instructions (Signed)
My pleasure meeting you  As discussed, stay vigilant with any new suspected bug bites and especially symptoms.    Information below on Lyme disease and Tick bites in general which you may find helpful.  If there is no improvement in your symptoms, or if there is any worsening of symptoms, or if you have any additional concerns, please return for re-evaluation; or, if we are closed, consider going to the Emergency Room for evaluation if symptoms urgent.  Patient education: Lyme disease (The Basics)View in [place]  Written by the doctors and Therapist, music at [employer]  What is Lyme disease? - Lyme disease is an illness that can make you feel like you have the flu. It can also cause a rash, fever, or nerve, joint, or heart problems. People can get Lyme disease after being bitten by a tiny insect called a tick. When a certain type of tick bites you, it can transmit the germ that causes Lyme disease from its body to yours. But a tick can infect you only if it stays attached for at least a day. The ticks that carry Lyme disease feed on deer and mice. Ticks are found in tall grass and on shrubs, and can attach to animals and people walking by. Ticks cannot fly or jump. What are the symptoms of Lyme disease? - Symptoms can start days or weeks after a tick bite. They include: ?A rash where you were bitten - The rash often appears within a month of getting bitten. It is red, but its center can be the color of your skin. It might get bigger over a few days. To some, it looks like a "bull's eye" (picture 1). ?Fever ?Feeling tired ?Body aches and pains ?Heart problems such as a slowed heart rate ?Headache and stiff neck ?Feelings of pain, weakness, or numbness If a person is not treated, further symptoms can occur months to years after a tick bite. These include: ?Pain and swelling of joints, such as your knees ?Trouble with your memory and thinking ?Skin problems, such as skin swelling or thinning (this occurs  mostly in [place]) Is there a test for Lyme disease? - Yes. Blood tests can show if you are infected with the germ that causes Lyme disease. But, it takes time for the blood tests to turn positive. This means the tests won't work if you get them right after being bitten. Also, sometimes the blood tests come back negative even when you have the rash that goes with Lyme disease. Because of this, if you have the rash, the blood test is not needed to confirm that you have Lyme disease. If your doctor or nurse suspects you have Lyme disease, he or she will do an exam and ask you questions. The doctor or nurse will use this information (and your blood test result, if needed) to decide about treatment. What should I do if I get bitten by a tick or if my child gets bitten? - If you find a tick on your body or on your child, use tweezers to grab it. Then pull it out slowly and gently. After that, wash the area with soap and water. You do not need to keep the tick. But knowing what it looked like can help your doctor decide about your treatment. See if you can tell: ?Its color and size ?If it was attached to your skin or just resting on your skin ?If it was big, round, and full of blood (picture 2) You should watch the area  around the bite for a month to see if a rash occurs. Should I see a doctor or nurse? - See your doctor or nurse if you have a tick and you cannot get it off or if you think you have had a tick attached for at least 36 hours (a day and a half). You should also see a doctor or nurse if you develop symptoms of Lyme disease. Some people don't know that they were bitten by a tick. Or they might not remember having a rash or early symptoms of Lyme disease. How is Lyme disease treated? - Lyme disease is usually treated with antibiotics. Treatment with antibiotics should help your symptoms go away. Sometimes, symptoms improve quickly. Other times, it can take weeks or months for symptoms to go away. Your  doctor might prescribe medicine for you to take right after a tick bite. Or your doctor might wait to see if you first develop symptoms. Either way, the medicine will treat your Lyme disease. What can I do to try to avoid getting bitten by a tick? - You can: ?Wear shoes, long-sleeved shirts, and long pants when you go outside. Keep ticks away from your skin by tucking your pants into your socks. ?Wear light colors so you can spot any ticks that get on your clothes ?Use bug sprays to keep ticks off your skin or clothes ?Shower within 2 hours of being outdoors if you think you have been in an area where there are ticks ?Check your clothes and body for ticks after being outdoors. Be sure to check your scalp, waist, armpits, groin, and backs of your knees. Check your children, too. ?If you live in a place that has deer or mice nearby, take steps to keep those animals away. Deer and mice carry ticks.   Tick Bite Information Ticks are insects that attach themselves to the skin and draw blood for food. There are various types of ticks. Common types include wood ticks and deer ticks. Most ticks live in shrubs and grassy areas. Ticks can climb onto your body when you make contact with leaves or grass where the tick is waiting. The most common places on the body for ticks to attach themselves are the scalp, neck, armpits, waist, and groin. Most tick bites are harmless, but sometimes ticks carry germs that cause diseases. These germs can be spread to a person during the tick's feeding process. The chance of a disease spreading through a tick bite depends on:   The type of tick.  Time of year.   How long the tick is attached.   Geographic location.  HOW CAN YOU PREVENT TICK BITES? Take these steps to help prevent tick bites when you are outdoors:  Wear protective clothing. Long sleeves and long pants are best.   Wear white clothes so you can see ticks more easily.  Tuck your pant legs into your  socks.   If walking on a trail, stay in the middle of the trail to avoid brushing against bushes.  Avoid walking through areas with long grass.  Put insect repellent on all exposed skin and along boot tops, pant legs, and sleeve cuffs.   Check clothing, hair, and skin repeatedly and before going inside.   Brush off any ticks that are not attached.  Take a shower or bath as soon as possible after being outdoors.  WHAT IS THE PROPER WAY TO REMOVE A TICK? Ticks should be removed as soon as possible to help prevent diseases  caused by tick bites. 1. If latex gloves are available, put them on before trying to remove a tick.  2. Using fine-point tweezers, grasp the tick as close to the skin as possible. You may also use curved forceps or a tick removal tool. Grasp the tick as close to its head as possible. Avoid grasping the tick on its body. 3. Pull gently with steady upward pressure until the tick lets go. Do not twist the tick or jerk it suddenly. This may break off the tick's head or mouth parts. 4. Do not squeeze or crush the tick's body. This could force disease-carrying fluids from the tick into your body.  5. After the tick is removed, wash the bite area and your hands with soap and water or other disinfectant such as alcohol. 6. Apply a small amount of antiseptic cream or ointment to the bite site.  7. Wash and disinfect any instruments that were used.  Do not try to remove a tick by applying a hot match, petroleum jelly, or fingernail polish to the tick. These methods do not work and may increase the chances of disease being spread from the tick bite.  WHEN SHOULD YOU SEEK MEDICAL CARE? Contact your health care provider if you are unable to remove a tick from your skin or if a part of the tick breaks off and is stuck in the skin.  After a tick bite, you need to be aware of signs and symptoms that could be related to diseases spread by ticks. Contact your health care provider if  you develop any of the following in the days or weeks after the tick bite:  Unexplained fever.  Rash. A circular rash that appears days or weeks after the tick bite may indicate the possibility of Lyme disease. The rash may resemble a target with a bull's-eye and may occur at a different part of your body than the tick bite.  Redness and swelling in the area of the tick bite.   Tender, swollen lymph glands.   Diarrhea.   Weight loss.   Cough.   Fatigue.   Muscle, joint, or bone pain.   Abdominal pain.   Headache.   Lethargy or a change in your level of consciousness.  Difficulty walking or moving your legs.   Numbness in the legs.   Paralysis.  Shortness of breath.   Confusion.   Repeated vomiting.    This information is not intended to replace advice given to you by your health care provider. Make sure you discuss any questions you have with your health care provider.   Document Released: 10/30/2000 Document Revised: 11/23/2014 Document Reviewed: 04/12/2013 Elsevier Interactive Patient Education [employer].

## 2017-07-07 ENCOUNTER — Ambulatory Visit (INDEPENDENT_AMBULATORY_CARE_PROVIDER_SITE_OTHER): Payer: Medicare Other | Admitting: *Deleted

## 2017-07-07 DIAGNOSIS — I4891 Unspecified atrial fibrillation: Secondary | ICD-10-CM

## 2017-07-07 LAB — POCT INR: INR: 2.7

## 2017-07-20 DIAGNOSIS — N5201 Erectile dysfunction due to arterial insufficiency: Secondary | ICD-10-CM | POA: Diagnosis not present

## 2017-07-20 DIAGNOSIS — N400 Enlarged prostate without lower urinary tract symptoms: Secondary | ICD-10-CM | POA: Diagnosis not present

## 2017-07-22 ENCOUNTER — Ambulatory Visit (INDEPENDENT_AMBULATORY_CARE_PROVIDER_SITE_OTHER): Payer: Medicare Other | Admitting: Internal Medicine

## 2017-07-22 ENCOUNTER — Telehealth: Payer: Self-pay | Admitting: Internal Medicine

## 2017-07-22 ENCOUNTER — Encounter: Payer: Self-pay | Admitting: Internal Medicine

## 2017-07-22 VITALS — BP 118/60 | HR 58 | Ht 74.0 in | Wt 219.0 lb

## 2017-07-22 DIAGNOSIS — I493 Ventricular premature depolarization: Secondary | ICD-10-CM | POA: Diagnosis not present

## 2017-07-22 DIAGNOSIS — Z79899 Other long term (current) drug therapy: Secondary | ICD-10-CM

## 2017-07-22 DIAGNOSIS — R001 Bradycardia, unspecified: Secondary | ICD-10-CM

## 2017-07-22 DIAGNOSIS — I480 Paroxysmal atrial fibrillation: Secondary | ICD-10-CM

## 2017-07-22 NOTE — Telephone Encounter (Signed)
Patient wants to David Rice

## 2017-07-22 NOTE — Patient Instructions (Signed)
Medication Instructions: - Your physician recommends that you continue on your current medications as directed. Please refer to the Current Medication list given to you today.  Labwork: - Your physician recommends that you have lab work today: BMP/ Magnesium  Procedures/Testing: - none ordered  Follow-Up: - Dr. Caryl Comes will see you back on an as needed basis.   Any Additional Special Instructions Will Be Listed Below (If Applicable).     If you need a refill on your cardiac medications before your next appointment, please call your pharmacy.

## 2017-07-22 NOTE — Telephone Encounter (Signed)
Documented in patients chart.

## 2017-07-22 NOTE — Progress Notes (Signed)
Patient Care Team: Crecencio Mc, MD as PCP - General (Internal Medicine)   HPI  David Rice is a 71 y.o. male seen in follow-up for atrial fibrillation and PVCs in the context of sinus bradycardia ; he has treated his sleep apnea  Because of ongoing issues with palpitations, he was admitted for the initiation of dofetilide 4/17  He has had markedly less atrial fibrillation; it seems to be prompted mostly by alcohol--especially if he is tired and has 2 glasses of wine.   He has a history of atrial fibrillation diagnosed apparently 12/15. Therapies with atenolol and flecainide were not tolerated and he underwent catheter ablation 7/16 [facility] .   This was complicated by a left femoral pseudoaneurysm and he underwent coiling in July of this year.   He underwent catheterization 1/16 [facility]. This apparently demonstrated no obstructive coronary disease.  (per patient). Aortic aneurysm (4.8 cm) (3/17) was noted; repeat scanning 11/17 was no interval change followed by Dr.   Gerilyn Pilgrim   Current recommendations her blood pressure control and lipid management  Records and Results Reviewed stress Myoview report from 2015 at [facility] was normal   Antiarrhythmics Date  flecainide 2016/   propafenone 2016 /   dofetilide 4/17    Date Cr K Mg LDL  7/17  1.25 3.6   84  4/18  1.07 4.2 2.3 39(2/18)   He saw Dr Gerilyn Pilgrim 7/18 with recommendation for followup CT  LDL now at target  Feels great   Occasional sinking feelings, lasting only 30-60 secs.  Not w position  No cp or sob, no edema     Past Medical History:  Diagnosis Date  . AAA (abdominal aortic aneurysm) ([place])   . Aorta aneurysm ([place])    "@ the valve; 4.5cm; [name] is watching it" (02/18/2016)  . Arthritis    "spine, shoulders, legs" (02/18/2016)  . Bowel obstruction ([place])   . Chronic lower back pain   . Diverticulitis   . Essential hypertension   . GERD (gastroesophageal reflux disease)   .  Hearing impaired   . Hyperlipidemia   . Hypertension   . On home oxygen therapy    "don't know how much; use it at night" (02/18/2016)  . OSA on CPAP   . Persistent atrial fibrillation [facility])    Status post ablation in July 2016 in [place].  . Thoracic aortic aneurysm without rupture [facility]     Past Surgical History:  Procedure Laterality Date  . CARDIAC CATHETERIZATION  11/2014   [place]  . CARDIAC ELECTROPHYSIOLOGY MAPPING AND ABLATION  05/2015  . COLECTOMY  11/10/2013   "diverticulitis"  . COLON SURGERY    . INGUINAL HERNIA REPAIR Left 1978  . NASAL SINUS SURGERY Left 04/1987  . PERIPHERAL VASCULAR CATHETERIZATION N/A 08/02/2015   Procedure: Abdominal Aortogram w/Lower Extremity;  Surgeon: Katha Cabal, MD;  Location: [place] [facility];  Service: Cardiovascular;  Laterality: N/A;  . PERIPHERAL VASCULAR CATHETERIZATION  08/02/2015   Procedure: Lower Extremity Intervention;  Surgeon: Katha Cabal, MD;  Location: [place] [facility];  Service: Cardiovascular;;  . TEE WITHOUT CARDIOVERSION N/A 02/17/2016   Procedure: TRANSESOPHAGEAL ECHOCARDIOGRAM (TEE);  Surgeon: Sanda Georgene Kopper, MD;  Location: [place];  Service: Cardiovascular;  Laterality: N/A;  . TONSILLECTOMY  1950  . WISDOM TOOTH EXTRACTION  1979    Current Outpatient Prescriptions  Medication Sig Dispense Refill  . Acetaminophen (TYLENOL PO) Take by  mouth.    . amLODipine (NORVASC) 5 MG tablet Take 1 tablet (5 mg total) by mouth daily. 90 tablet 0  . atorvastatin (LIPITOR) 10 MG tablet Take 1 tablet (10 mg total) by mouth daily. 90 tablet 3  . dofetilide (TIKOSYN) 250 MCG capsule Take 1 capsule (250 mcg total) by mouth 2 (two) times daily. 180 capsule 3  . Lactase (LACTAID PO) Take by mouth. Take  1 capsule twice daily.    Marland Kitchen lisinopril (PRINIVIL,ZESTRIL) 10 MG tablet Take 1 tablet (10 mg total) by mouth daily. 30 tablet 5  . loratadine (CLARITIN) 10 MG tablet Take 10 mg by mouth 2 (two) times  daily.     . polyethylene glycol (MIRALAX / GLYCOLAX) packet Take 17 g by mouth daily as needed.    . tadalafil (CIALIS) 5 MG tablet Take 5 mg by mouth daily.    . traMADol (ULTRAM) 50 MG tablet Take 1 tablet (50 mg total) by mouth every 6 (six) hours as needed. (Patient taking differently: Take 50 mg by mouth every 6 (six) hours as needed. Rarely uses) 90 tablet 2  . traZODone (DESYREL) 50 MG tablet 1/2 to 1 tablet by mouth One hour prior to bedtime 90 tablet 1  . warfarin (COUMADIN) 5 MG tablet Take as directed by [facility] Clinic 50 tablet 3   No current facility-administered medications for this visit.     Allergies  Allergen Reactions  . Lactose Intolerance (Gi)       Review of Systems negative except from HPI and PMH  Physical Exam BP 118/60 (BP Location: Left Arm, Patient Position: Sitting, Cuff Size: Normal)   Pulse (!) 58   Ht 6\' 2"  (1.88 m)   Wt 219 lb (99.3 kg)   BMI 28.12 kg/m  Well developed and nourished in no acute distress HENT normal Neck supple with JVP-flat Carotids brisk and full without bruits Clear Regular rate and rhythm, no murmurs or gallops Abd-soft with active BS without hepatomegaly No Clubbing cyanosis edema Skin-warm and dry A & Oriented  Grossly normal sensory and motor function   ECG demonstrates  Normal 58 20/09/42 Po SMI old  Assessment and  Plan  Atrial fibrillation-paroxysmal//History of atrial fibrillation ablation  PVCs-frequent  Sinus bradycardia  Aortic aneurysm  High Risk Medication Surveillance  Obstructive sleep apnea-treated   Dyslipidemia  Lightheaded/sinking feelings      He is tolerating his dofetilide; it has been very effective. Will check surveillance labs  He is on warfarin and tolerating it well. He is not interested in NOACs. This was reiterated today.   BP is better and with lightheaded spells, he will stop his amlodipine and see if it exceeds 125; if so will resume at 125  LDL now at  target  Moving to [facility]

## 2017-07-23 LAB — BASIC METABOLIC PANEL
BUN/Creatinine Ratio: 15 (ref 10–24)
BUN: 13 mg/dL (ref 8–27)
CALCIUM: 9.1 mg/dL (ref 8.6–10.2)
CHLORIDE: 100 mmol/L (ref 96–106)
CO2: 24 mmol/L (ref 20–29)
Creatinine, Ser: 0.86 mg/dL (ref 0.76–1.27)
GFR calc Af Amer: 101 mL/min/{1.73_m2} (ref >59)
GFR calc non Af Amer: 87 mL/min/{1.73_m2} (ref >59)
GLUCOSE: 80 mg/dL (ref 65–99)
POTASSIUM: 5 mmol/L (ref 3.5–5.2)
Sodium: 139 mmol/L (ref 134–144)

## 2017-07-23 LAB — MAGNESIUM: Magnesium: 2.5 mg/dL — ABNORMAL HIGH (ref 1.6–2.3)

## 2017-08-12 ENCOUNTER — Other Ambulatory Visit: Payer: Self-pay

## 2017-08-12 ENCOUNTER — Telehealth: Payer: Self-pay | Admitting: Internal Medicine

## 2017-08-12 MED ORDER — DOFETILIDE 250 MCG PO CAPS: 250.0000 ug | ORAL_CAPSULE | Freq: Two times a day (BID) | ORAL | 3 refills | Status: AC

## 2017-08-12 NOTE — Telephone Encounter (Signed)
°*  STAT* If patient is at the pharmacy, call can be transferred to refill team.   1. Which medications need to be refilled? (please list name of each medication and dose if known) tocsin 20.25mg  2. Which pharmacy/location (including street and city if local pharmacy) is medication to be sent to? [facility] [phone]  [place] [phone] 3. Do they need a 30 day or 90 day supply? [place]

## 2017-08-12 NOTE — Telephone Encounter (Signed)
Requested Prescriptions   Signed Prescriptions Disp Refills  . dofetilide (TIKOSYN) 250 MCG capsule 180 capsule 3    Sig: Take 1 capsule (250 mcg total) by mouth 2 (two) times daily.    Authorizing Provider: [name]    Ordering User: Janan Ridge

## 2017-08-30 DIAGNOSIS — Z23 Encounter for immunization: Secondary | ICD-10-CM | POA: Diagnosis not present

## 2017-09-01 ENCOUNTER — Ambulatory Visit (INDEPENDENT_AMBULATORY_CARE_PROVIDER_SITE_OTHER): Payer: Medicare Other

## 2017-09-01 DIAGNOSIS — I4891 Unspecified atrial fibrillation: Secondary | ICD-10-CM

## 2017-09-01 LAB — POCT INR: INR: 2.7

## 2018-02-03 ENCOUNTER — Telehealth: Payer: Self-pay | Admitting: *Deleted

## 2018-02-03 NOTE — Telephone Encounter (Signed)
Received order from [facility] supplies. Contacted patient who states he has moved out of state and OSA now managed by the [place]. Nothing further needed.

## 2018-05-06 ENCOUNTER — Other Ambulatory Visit: Payer: Self-pay | Admitting: Cardiothoracic Surgery

## 2018-05-06 DIAGNOSIS — I712 Thoracic aortic aneurysm, without rupture, unspecified: Secondary | ICD-10-CM

## 2018-06-09 ENCOUNTER — Ambulatory Visit: Payer: Self-pay | Admitting: Cardiothoracic Surgery

## 2018-06-09 ENCOUNTER — Other Ambulatory Visit: Payer: Self-pay

## 2018-07-11 ENCOUNTER — Telehealth (HOSPITAL_COMMUNITY): Payer: Self-pay | Admitting: Cardiovascular Disease

## 2018-07-11 NOTE — Telephone Encounter (Signed)
Did not need this encounter °

## 2019-08-03 ENCOUNTER — Telehealth: Payer: Self-pay | Admitting: Internal Medicine

## 2019-08-03 NOTE — Telephone Encounter (Signed)
Spoke to pt to schedule AWV. He is currently living in [facility] but is possibly moving back to [place] and would like stay with Dr. Derrel Nip. He will call us back and let us know.

## 2020-07-05 ENCOUNTER — Ambulatory Visit (INDEPENDENT_AMBULATORY_CARE_PROVIDER_SITE_OTHER): Admitting: Cardiovascular Disease

## 2020-07-05 ENCOUNTER — Encounter (INDEPENDENT_AMBULATORY_CARE_PROVIDER_SITE_OTHER): Payer: Self-pay | Admitting: Cardiovascular Disease

## 2020-07-05 VITALS — BP 116/70 | HR 103 | Wt 216.6 lb

## 2020-07-05 DIAGNOSIS — R079 Chest pain, unspecified: Secondary | ICD-10-CM

## 2020-07-05 DIAGNOSIS — I4891 Unspecified atrial fibrillation: Secondary | ICD-10-CM

## 2020-07-05 DIAGNOSIS — I712 Thoracic aortic aneurysm, without rupture, unspecified: Secondary | ICD-10-CM

## 2020-07-05 NOTE — Progress Notes (Signed)
[facility] C[facility] OFF[facility] CONSULTATION NOTE    [facility] OFFICE -CARDIOLOGY  [address]  [address]  [place] [address]  Dept: [phone]  Dept Fax: [phone]         Patient Name: Jason Bullock, Jason Bullock    Date of Visit:  July 05, 2020  Date of Birth: 1946/03/23  AGE: 74 y.o.  Medical Record #: [id number]    CHIEF COMPLAINT:  new patient       HISTORY OF PRESENT ILLNESS    Mr. Hoeg is a pleasant 74 y.o. male being seen today for atrial fibrillation.  The patient has a complicated history of atrial fibrillation and the records are available.  He is accompanied by his daughter today.  From his report, he had ablation of atrial fibrillation first in 2015, then again in 2017, and most recently this past July.  This was all done in the medical [place] [facility] [place] in the [place] in [place].  We have requested records.  The patient says he has not felt well recently.  He stopped taking medications except for atrial fibrillation diltiazem, furosemide, and warfarin because he thought that they were causing him to feel unwell.  He did feel better after stopping some of the other medications.  He did feel best after he was in normal rhythm after the ablation but after a few days, went back in atrial fibrillation.  His daughter recalls that his heart function was told to be mildly reduced.  He has no history of heart attack or stroke.  He also tells me he has a 10 cm thoracic aortic aneurysm.  They have told him they are just monitoring this.  Today he has no active cardiac complaints.  Other than generalized fatigue and lack of energy, he denies chest pain, shortness of breath, or palpitations.    ECG : Atrial fibrillation    Medical records available in Epic reviewed.    PAST MEDICAL/SURGICAL HISTORY: He has a past medical history of Atrial fibrillation. He has a past surgical history that includes Ablation of dysrhythmic focus.    ALLERGIES: None  reported     MEDICATIONS:   Current Outpatient Medications   Medication Sig    acetaminophen (TYLENOL) 325 MG tablet Take 650 mg by mouth every 6 (six) hours as needed for Pain    carboxymethylcellulose, PF, (REFRESH PLUS) 0.5 % ophthalmic solution Place 1 drop into both eyes 3 (three) times daily as needed    dilTIAZem (CARDIZEM) 30 MG tablet Take 30 mg by mouth 3 (three) times daily    furosemide (LASIX) 40 MG tablet Take 40 mg by mouth daily    loratadine (CLARITIN) 10 MG tablet Take 10 mg by mouth daily    warfarin (COUMADIN) 5 MG tablet Take 5 mg by mouth daily As directed        FAMILY HISTORY: No relevant family history    SOCIAL HISTORY: He reports that he has never smoked. He has never used smokeless tobacco. He reports current alcohol use.    REVIEW OF SYSTEMS:   Patient has no complaints for constitutional, eyes, ears/nose/mouth/throat, cardiovascular, respiratory, gastrointestinal, urinary, skin, neurologic, and psychiatric review of symptoms except as stated in history of present illness.    PHYSICAL EXAMINATION    Visit Vitals  BP 116/70 (BP Site: Left arm, Patient Position: Sitting, Cuff Size: Large)   Pulse (!) 103   Wt 98.2 kg (216 lb 9.6 oz)  General Appearance:  A well-appearing male in no acute distress.    Skin: Warm and dry to touch, no apparent skin lesions, or masses noted.  Head: Normocephalic, normal hair pattern, no masses or tenderness   Eyes: EOMS Intact, PERRL, conjunctivae and lids unremarkable.  ENT: Ears, Nose and throat reveal no gross abnormalities.  No pallor or cyanosis.  Dentition good.   Neck: JVP normal, no carotid bruit, thyroid not enlarged   Chest: Clear to auscultation bilaterally with good air movement and respiratory effort and no wheezes, rales, or rhonchi   Cardiovascular: Irregular rhythm, S1 normal, S2 normal, No S3 or S4, Apical impulse not displaced.  Systolic murmur left sternal border  Abdomen: Soft, nontender, nondistended, with normoactive bowel  sounds. No organomegaly.  No pulsatile masses, or bruits.   Extremities: Warm with trace edema. No clubbing, or cyanosis. All peripheral pulses are full and equal.   Neuro: Alert and oriented x3. No gross motor or sensory deficits noted, affect appropriate.      Laboratory results available in Epic reviewed.    IMPRESSION:   Mr. Oaxaca is a 74 y.o. male with the following cardiology relevant diagnoses:    1. Atrial fibrillation.  CHADSVASC 2.  Anticoagulation with Eliquis.   Ablation 2015, 2017, and 05/2020  2. Patient reports mild cardiomyopathy  3. Patient reports thoracic aortic aneurysm  4. Hypertension      RECOMMENDATIONS:    The patient has no acute concerns, but has complicated history primarily of atrial fibrillation.  We need to get records from [place].  I would like him to see our electrophysiologist to discuss rhythm management and will have him come back in 2 weeks after obtaining records for further general cardiac management.                                                 Orders Placed This Encounter   Procedures    ECG 12 lead (Normal)    APP Office Visit ([facility] [place])    EP Consult (HRT )       SIGNED:        Deshae Dickison A. Marnette Burgess, MD, [facility]  [place]    This note was generated by the Dragon speech recognition and may contain errors or omissions not intended by the user. Grammatical errors, random word insertions, deletions, pronoun errors, and incomplete sentences are occasional consequences of this technology due to software limitations. Not all errors are caught or corrected. If there are questions or concerns about the content of this note or information contained within the body of this dictation, they should be addressed directly with the author for clarification.

## 2020-07-10 ENCOUNTER — Encounter (INDEPENDENT_AMBULATORY_CARE_PROVIDER_SITE_OTHER): Payer: Self-pay

## 2020-07-24 NOTE — Progress Notes (Signed)
[place] [facility] ELECTROPHYSIOLOGY OFFICE CONSULTATION NOTE    [facility] OFFICE      [place] [facility] OFFICE [facility]  [address]  [place] [address]  Dept: [phone]  Dept Fax: [phone]         Patient Name: Jason Bullock, Jason Bullock    Date of Visit:  July 25, 2020  Date of Birth: 1946-05-19  AGE: 74 y.o.  Medical Record #: [id number]  Requesting Physician:No primary care provider on file.      PRIMARY CARDIOLOGIST: Dorthula Nettles MD, [facility]    Primary Electrophysiologist   New EP Consult      CHIEF COMPLAINT:  No chief complaint on file.      HISTORY OF PRESENT ILLNESS    Mr. Bogusz is being seen today for Electrophysiology evaluation at the request of No primary care provider on file. He is a pleasant 74 y.o. male who has a history of hypertension, heart failure with preserved ejection fraction, AAA, paroxysmal atrial fibrillation status post prior AF ablations in 2017 and most recently in July 2021 here for new follow-up.    The patient states that he has had a history of atrial fibrillation for many years ago.  He had an ablation procedure performed in 2017, and had a recurrence shortly after this.  He was initiated on Tikosyn after this ablation which was initially effective, but then had a recurrence about a year later.  He was then put on amiodarone, which was very successful in restoring sinus rhythm.  However, he developed skin toxicity and worsening neurologic symptoms.   Amiodarone was discontinued about 6 months ago.  He has been tried on beta-blockers in the past as well given that he has had rapid ventricular response up to 160 bpm.  However, he has had side effects of extreme fatigue with beta-blockers and cannot tolerate this.    He has had prior cardioversions in the past, and he notes a vast improvement in his symptoms when he is in normal rhythm.  He was on Eliquis before, but now he has been transitioned to Eliquis and has been consistent with taking this  regularly.    In July 2021, he underwent a redo pulmonary vein isolation in which the right inferior pulmonary vein was found to be reconnected, and was thus isolated.  There was also some targeted ablation of areas of low voltage around the carina and right superior pulmonary vein.  However, he has had a recurrence of atrial fibrillation, and feels very symptomatic with this.  His EKG couple of weeks ago demonstrates atrial fibrillation.    He had seen his electrophysiologist in [place], and a cardioversion was planned, however this was not performed as of yet.  He still intends to get most of his care at [facility] given his connection to the [place] there.  He is here seeking a second opinion.    Notably, the patient has a history of an ascending aortic aneurysm which she was told that an intervention should be considered soon for this, although this has been deferred up to this point.      PAST MEDICAL HISTORY: He has a past medical history of Atrial fibrillation. He has a past surgical history that includes Ablation of dysrhythmic focus.    ALLERGIES: Not on File    MEDICATIONS:   Current Outpatient Medications   Medication Sig    acetaminophen (TYLENOL) 325 MG tablet Take 650 mg by mouth every 6 (six) hours  as needed for Pain    apixaban (Eliquis) 5 MG Take 5 mg by mouth every 12 (twelve) hours    carboxymethylcellulose, PF, (REFRESH PLUS) 0.5 % ophthalmic solution Place 1 drop into both eyes 3 (three) times daily as needed    dilTIAZem (CARDIZEM) 30 MG tablet Take 30 mg by mouth 3 (three) times daily    furosemide (LASIX) 40 MG tablet Take 40 mg by mouth daily    Multiple Vitamins-Minerals (CENTRUM SILVER 50+MEN PO) Take 1 tablet by mouth daily    loratadine (CLARITIN) 10 MG tablet Take 10 mg by mouth daily        FAMILY HISTORY: family history is not on file.    SOCIAL HISTORY: He reports that he has never smoked. He has never used smokeless tobacco. He reports current alcohol use.    REVIEW OF  SYSTEMS:   All other systems reviewed and negative except as above.       PHYSICAL EXAMINATION    Visit Vitals  BP 124/80 (BP Site: Right arm, Patient Position: Sitting, Cuff Size: Large)   Pulse 67   Wt 102.6 kg (226 lb 3.2 oz)       General: Conversant, cooperative no acute distress  Head: Normocephalic, atraumatic  Eyes: Extra-ocular movements grossly intact, conjunctivae and lids normal  ENT: No pallor or cyanosis, ears and nose with no gross abnormalities  Chest: Non-labored breathing, normal respiratory effort  CV: No JVD  Abdomen: Non-distended  Musculoskeletal: No deformities  Neurologic: No gross motor or sensory deficits noted  Skin: Non-jaundiced, no pallor, no apparent skin lesions  Psych: Alert and oriented, normal affect. Intact judgment and insight      LABS:   No results found for: WBC, HGB, HCT, PLT  No results found for: GLU, BUN, CREAT, NA, K, CL, CO2, CA, PROT, AST, ALT, BILITOTAL, GLOB, ALB  No results found for: MG, TSH, HGBA1C, BNP  No results found for: CHOL, TRIG, HDL, LDL, LABVLDL, CHOLHDLRATIO  No results found for: CREATCL        IMPRESSION:   Mr. Catterton is a 73 y.o. male with the following problems:    1. Paroxysmal atrial fibrillation, status post prior PVI performed in 2015, 2017, in July 2021 at [facility] [place].  Most recent PVI involved reisolation of the right superior pulmonary vein and limited focal ablation of areas of low voltage.  2. Hypertension      RECOMMENDATIONS:    Mr. Levee has had a recurrence of atrial fibrillation relatively early after his AF ablation procedure.  He is here for a second opinion.  He has had an early recurrence after a redo PVI in which the right superior pulmonary vein was reisolated and focal areas of low voltage were ablated.  It does not appear that any additional targets for ablation such as the posterior wall were targeted.  Given how persistent his atrial fibrillation had been, I imagine that if redo ablation were to be  performed, we would have to do additional ablation including at least the posterior wall and consideration for vein of [facility] ablation.  He may also have non-PV triggers that should be investigated.    We discussed the potential management approaches from here.  The patient is not satisfied with a rate control approach only given how symptomatic he is when in atrial fibrillation.  I am skeptical that just a one-time cardioversion will keep him in sinus rhythm, as it does not appear that extensive ablation was  done with his redo procedure in July.  We discussed consideration for getting back on Tikosyn, which would require repeat inpatient monitoring and hospitalization for 72 hours, and then performing a cardioversion then.  We also discussed a redo AF ablation, and the likely need for more than pulmonary vein isolation.    The other consideration is that he has a thoracic aortic aneurysm, and he is being monitored for a time as to when he should consider intervention for this.  I did tell him that should he get open heart surgery, the surgeons can also simultaneously perform a maze procedure with left atrial appendage ligation which may help significantly with suppressing atrial fibrillation.  If he is on the borderline on whether to proceed with surgery from the aneurysm standpoint, perhaps his persistent atrial fibrillation and pursuing a Maze procedure/left atrial appendage ligation may push [place] more towards an earlier intervention.                                                    No orders of the defined types were placed in this encounter.      No orders of the defined types were placed in this encounter.        SIGNED:    Krystal Clark, MD  Cardiac Electrophysiology  [facility]         This note was generated by the Dragon speech recognition and may contain errors or omissions not intended by the user. Grammatical errors, random word insertions, deletions, pronoun errors, and incomplete sentences are  occasional consequences of this technology due to software limitations. Not all errors are caught or corrected. If there are questions or concerns about the content of this note or information contained within the body of this dictation, they should be addressed directly with the author for clarification.

## 2020-07-25 ENCOUNTER — Encounter (INDEPENDENT_AMBULATORY_CARE_PROVIDER_SITE_OTHER): Payer: Self-pay | Admitting: Cardiovascular Disease

## 2020-07-25 ENCOUNTER — Ambulatory Visit (INDEPENDENT_AMBULATORY_CARE_PROVIDER_SITE_OTHER): Admitting: Cardiovascular Disease

## 2020-07-25 DIAGNOSIS — I4891 Unspecified atrial fibrillation: Secondary | ICD-10-CM

## 2020-07-25 NOTE — Progress Notes (Deleted)
[place] [facility] CARDIOLOGY OFFICE PROGRESS NOTE    [facility] OFFICE -CARDIOLOGY  [phone]  [address]  [place] [address]  Dept: [phone]  Dept Fax: [phone]       Patient Name: Jason Bullock, Jason Bullock    Date of Visit:  July 25, 2020  Date of Birth: Dec 07, 1945  AGE: 74 y.o.  Medical Record #: [id number]  Requesting Physician: No primary care provider on file.      CHIEF COMPLAINT: No chief complaint on file.      HISTORY OF PRESENT ILLNESS:    He is a pleasant 74 y.o. male who presents today for ***    ta 8/20  Jason Bullock is a pleasant 74 y.o. male being seen today for atrial fibrillation.  The patient has a complicated history of atrial fibrillation and the records are available.  He is accompanied by his daughter today.  From his report, he had ablation of atrial fibrillation first in 2015, then again in 2017, and most recently this past July.  This was all done in the medical [place] [facility] [place] in the [place] in [place].  We have requested records.  The patient says he has not felt well recently.  He stopped taking medications except for atrial fibrillation diltiazem, furosemide, and warfarin because he thought that they were causing him to feel unwell.  He did feel better after stopping some of the other medications.  He did feel best after he was in normal rhythm after the ablation but after a few days, went back in atrial fibrillation.  His daughter recalls that his heart function was told to be mildly reduced.  He has no history of heart attack or stroke.  He also tells me he has a 10 cm thoracic aortic aneurysm.  They have told him they are just monitoring this.  Today he has no active cardiac complaints.  Other than generalized fatigue and lack of energy, he denies chest pain, shortness of breath, or palpitations.    The patient has no acute concerns, but has complicated history primarily of atrial fibrillation.  We need to get records  from [place].  I would like him to see our electrophysiologist to discuss rhythm management and will have him come back in 2 weeks after obtaining records for further general cardiac management.    PAST MEDICAL HISTORY: He has a past medical history of Atrial fibrillation. He has a past surgical history that includes Ablation of dysrhythmic focus.    ALLERGIES: Not on File    MEDICATIONS:   Current Outpatient Medications   Medication Sig    acetaminophen (TYLENOL) 325 MG tablet Take 650 mg by mouth every 6 (six) hours as needed for Pain    carboxymethylcellulose, PF, (REFRESH PLUS) 0.5 % ophthalmic solution Place 1 drop into both eyes 3 (three) times daily as needed    dilTIAZem (CARDIZEM) 30 MG tablet Take 30 mg by mouth 3 (three) times daily    furosemide (LASIX) 40 MG tablet Take 40 mg by mouth daily    loratadine (CLARITIN) 10 MG tablet Take 10 mg by mouth daily    warfarin (COUMADIN) 5 MG tablet Take 5 mg by mouth daily As directed        FAMILY HISTORY: family history is not on file.    SOCIAL HISTORY: He reports that he has never smoked. He has never used smokeless tobacco. He reports current alcohol use.    PHYSICAL EXAMINATION  There were no vitals taken for this visit.    General Appearance:  A well-appearing male in no acute distress.    Skin: Warm and dry to touch, no apparent skin lesions, or masses noted.  Head: Normocephalic, normal hair pattern, no masses or tenderness   Eyes: EOMS Intact, PERRL, conjunctivae and lids unremarkable.  ENT: Ears, Nose and throat reveal no gross abnormalities.  No pallor or cyanosis.  Dentition good.   Neck: JVP normal, no carotid bruit, thyroid not enlarged   Chest: Clear to auscultation bilaterally with good air movement and respiratory effort and no wheezes, rales, or rhonchi   Cardiovascular: Regular rhythm, S1 normal, S2 normal, No S3 or S4. Apical impulse not displaced. No murmur. No gallops or rubs detected   Abdomen: Soft, nontender, nondistended,  with normoactive bowel sounds. No organomegaly.  No pulsatile masses, or bruits.   Extremities: Warm without edema. No clubbing, or cyanosis. All peripheral pulses are full and equal.   Neuro: Alert and oriented x3. No gross motor or sensory deficits noted, affect appropriate.        ECG: ***      LABS:   No results found for: WBC, HGB, HCT, PLT  No results found for: GLU, BUN, CREAT, NA, K, CL, CO2, AST, ALT  No results found for: MG, TSH, HGBA1C, BNP  No results found for: CHOL, TRIG, HDL, LDL        *** Most recent echo and nuclear study reviewed.      IMPRESSION:   1. Atrial fibrillation.  CHADSVASC 2.  Anticoagulation with Eliquis.   Ablation 2015, 2017, and 05/2020  2. Patient reports mild cardiomyopathy  3. Patient reports thoracic aortic aneurysm  4. Hypertension      RECOMMENDATIONS:    ***                                                     No orders of the defined types were placed in this encounter.      No orders of the defined types were placed in this encounter.      SIGNED:    Ella Bodo, FNP          This note was generated by the Dragon speech recognition and may contain errors or omissions not intended by the user. Grammatical errors, random word insertions, deletions, pronoun errors, and incomplete sentences are occasional consequences of this technology due to software limitations. Not all errors are caught or corrected. If there are questions or concerns about the content of this note or information contained within the body of this dictation, they should be addressed directly with the author for clarification.

## 2020-07-26 ENCOUNTER — Encounter (INDEPENDENT_AMBULATORY_CARE_PROVIDER_SITE_OTHER): Admitting: Family Nurse Practitioner

## 2020-08-02 ENCOUNTER — Telehealth (INDEPENDENT_AMBULATORY_CARE_PROVIDER_SITE_OTHER): Admitting: Family Nurse Practitioner

## 2021-01-22 ENCOUNTER — Encounter (INDEPENDENT_AMBULATORY_CARE_PROVIDER_SITE_OTHER): Payer: Self-pay

## 2022-03-10 ENCOUNTER — Emergency Department: Payer: Non-veteran care

## 2022-03-10 ENCOUNTER — Observation Stay
Admission: EM | Admit: 2022-03-10 | Discharge: 2022-03-12 | Disposition: A | Discharge status: Home / Self Care | Payer: Non-veteran care | Attending: Internal Medicine | Admitting: Internal Medicine

## 2022-03-10 DIAGNOSIS — I480 Paroxysmal atrial fibrillation: Secondary | ICD-10-CM | POA: Insufficient documentation

## 2022-03-10 DIAGNOSIS — I5032 Chronic diastolic (congestive) heart failure: Secondary | ICD-10-CM | POA: Insufficient documentation

## 2022-03-10 DIAGNOSIS — R410 Disorientation, unspecified: Secondary | ICD-10-CM | POA: Insufficient documentation

## 2022-03-10 DIAGNOSIS — R251 Tremor, unspecified: Secondary | ICD-10-CM

## 2022-03-10 DIAGNOSIS — Z6832 Body mass index (BMI) 32.0-32.9, adult: Secondary | ICD-10-CM | POA: Insufficient documentation

## 2022-03-10 DIAGNOSIS — E669 Obesity, unspecified: Secondary | ICD-10-CM | POA: Insufficient documentation

## 2022-03-10 DIAGNOSIS — I7121 Aneurysm of the ascending aorta, without rupture: Secondary | ICD-10-CM | POA: Insufficient documentation

## 2022-03-10 DIAGNOSIS — R0789 Other chest pain: Secondary | ICD-10-CM | POA: Insufficient documentation

## 2022-03-10 DIAGNOSIS — Z7901 Long term (current) use of anticoagulants: Secondary | ICD-10-CM | POA: Insufficient documentation

## 2022-03-10 DIAGNOSIS — I440 Atrioventricular block, first degree: Secondary | ICD-10-CM | POA: Insufficient documentation

## 2022-03-10 DIAGNOSIS — R0609 Other forms of dyspnea: Principal | ICD-10-CM | POA: Insufficient documentation

## 2022-03-10 DIAGNOSIS — Z79899 Other long term (current) drug therapy: Secondary | ICD-10-CM | POA: Insufficient documentation

## 2022-03-10 DIAGNOSIS — E785 Hyperlipidemia, unspecified: Secondary | ICD-10-CM | POA: Insufficient documentation

## 2022-03-10 DIAGNOSIS — R079 Chest pain, unspecified: Secondary | ICD-10-CM | POA: Diagnosis present

## 2022-03-10 DIAGNOSIS — I110 Hypertensive heart disease with heart failure: Secondary | ICD-10-CM | POA: Insufficient documentation

## 2022-03-10 DIAGNOSIS — Z8679 Personal history of other diseases of the circulatory system: Secondary | ICD-10-CM | POA: Insufficient documentation

## 2022-03-10 DIAGNOSIS — Z7984 Long term (current) use of oral hypoglycemic drugs: Secondary | ICD-10-CM | POA: Insufficient documentation

## 2022-03-10 LAB — HIGH SENSITIVITY TROPONIN-I: hs Troponin-I: 4.4 ng/L

## 2022-03-10 LAB — CBC AND DIFFERENTIAL
Absolute NRBC: 0 10*3/uL (ref 0.00–0.00)
Basophils Absolute Automated: 0.03 10*3/uL (ref 0.00–0.08)
Basophils Automated: 0.5 %
Eosinophils Absolute Automated: 0.04 10*3/uL (ref 0.00–0.44)
Eosinophils Automated: 0.6 %
Hematocrit: 51.7 % — ABNORMAL HIGH (ref 37.6–49.6)
Hgb: 18 g/dL — ABNORMAL HIGH (ref 12.5–17.1)
Immature Granulocytes Absolute: 0.01 10*3/uL (ref 0.00–0.07)
Immature Granulocytes: 0.2 %
Instrument Absolute Neutrophil Count: 4.52 10*3/uL (ref 1.10–6.33)
Lymphocytes Absolute Automated: 1.38 10*3/uL (ref 0.42–3.22)
Lymphocytes Automated: 21.2 %
MCH: 31.8 pg (ref 25.1–33.5)
MCHC: 34.8 g/dL (ref 31.5–35.8)
MCV: 91.3 fL (ref 78.0–96.0)
MPV: 11.6 fL (ref 8.9–12.5)
Monocytes Absolute Automated: 0.53 10*3/uL (ref 0.21–0.85)
Monocytes: 8.1 %
Neutrophils Absolute: 4.52 10*3/uL (ref 1.10–6.33)
Neutrophils: 69.4 %
Nucleated RBC: 0 /100 WBC (ref 0.0–0.0)
Platelets: 164 10*3/uL (ref 142–346)
RBC: 5.66 10*6/uL (ref 4.20–5.90)
RDW: 14 % (ref 11–15)
WBC: 6.51 10*3/uL (ref 3.10–9.50)

## 2022-03-10 LAB — GFR: EGFR: 58.9

## 2022-03-10 LAB — URINALYSIS REFLEX TO MICROSCOPIC EXAM - REFLEX TO CULTURE
Bilirubin, UA: NEGATIVE
Blood, UA: NEGATIVE
Glucose, UA: >=500 — AB
Ketones UA: NEGATIVE
Leukocyte Esterase, UA: NEGATIVE
Nitrite, UA: NEGATIVE
Protein, UR: NEGATIVE
Specific Gravity UA: 1.018 (ref 1.001–1.035)
Urine pH: 6 (ref 5.0–8.0)
Urobilinogen, UA: NORMAL mg/dL (ref 0.2–2.0)

## 2022-03-10 LAB — COMPREHENSIVE METABOLIC PANEL
ALT: 17 U/L (ref 0–55)
AST (SGOT): 16 U/L (ref 5–41)
Albumin/Globulin Ratio: 1.5 (ref 0.9–2.2)
Albumin: 4.1 g/dL (ref 3.5–5.0)
Alkaline Phosphatase: 111 U/L (ref 37–117)
Anion Gap: 11 (ref 5.0–15.0)
BUN: 16 mg/dL (ref 9.0–28.0)
Bilirubin, Total: 0.4 mg/dL (ref 0.2–1.2)
CO2: 25 mEq/L (ref 17–29)
Calcium: 9.6 mg/dL (ref 7.9–10.2)
Chloride: 101 mEq/L (ref 99–111)
Creatinine: 1.2 mg/dL (ref 0.5–1.5)
Globulin: 2.8 g/dL (ref 2.0–3.6)
Glucose: 141 mg/dL — ABNORMAL HIGH (ref 70–100)
Potassium: 4.2 mEq/L (ref 3.5–5.3)
Protein, Total: 6.9 g/dL (ref 6.0–8.3)
Sodium: 137 mEq/L (ref 135–145)

## 2022-03-10 LAB — IHS D-DIMER: D-Dimer: 0.44 ug/mL FEU (ref 0.00–0.60)

## 2022-03-10 LAB — ECG 12-LEAD
P Axis: 55 degrees
R Axis: 12 degrees

## 2022-03-10 LAB — LIPASE: Lipase: 46 U/L (ref 8–78)

## 2022-03-10 LAB — HIGH SENSITIVITY TROPONIN-I WITH DELTA
hs Troponin-I Delta: -0.3 ng/L
hs Troponin-I: 4.1 ng/L

## 2022-03-10 MED ORDER — METOCLOPRAMIDE HCL 5 MG/ML IJ SOLN
10.0000 mg | Freq: Once | INTRAMUSCULAR | Status: AC
Start: 2022-03-10 — End: 2022-03-10
  Administered 2022-03-10: 10 mg via INTRAVENOUS
  Filled 2022-03-10: qty 2

## 2022-03-10 NOTE — ED Triage Notes (Signed)
Patient presents to ED with c/o abdominal distention for x 1 month. Patient reports sob, lightheadedness, and constipation. Denies cp, palpitations, or N/V. Last BM today was "small hard stool".

## 2022-03-10 NOTE — ED Provider Notes (Signed)
EMERGENCY DEPARTMENT  ATTENDING PHYSICIAN HISTORY AND PHYSICAL EXAM     Patient Name: Jason Bullock, Jason Bullock  Encounter Date:  03/10/2022  Attending Physician: Carlus Pavlov, MD   Room:  07/07  Patient DOB:  06/05/1946  Age: 76 y.o. male  MRN:  [id number]  PCP: Pcp, None, MD      History of Presenting Illness:   Chief complaint: Abdominal Pain and Shortness of Breath      76 year old male with past medical history of A-fib, ascending aortic aneurysm, HFpEF, hypertension, hyperlipidemia here for not feeling well over the last 2 weeks with worsening chest pain x1 day.  Patient states that he has been having intermittent episodes of shortness of breath feels like he cannot take a deep breath in.  Dyspnea on exertion.  Patient also endorses chest pain that started this morning radiating into the left arm occurred while at rest.  It has been intermittent throughout the day.  Patient also endorses increased abdominal distention with mild intermittent leg swelling during this time.  Patient states he has been compliant with his medications including his diuretic.  Patient lives in [place] is here visiting his daughter.  Patient follows with a cardiologist through the [facility] there.      Abdominal Pain  Associated symptoms: shortness of breath    Associated symptoms: no chest pain, no chills, no cough, no diarrhea, no dysuria, no fever, no nausea and no vomiting    Shortness of Breath  Associated symptoms: abdominal pain    Associated symptoms: no chest pain, no cough, no fever, no headaches, no rash and no vomiting             Allergies & Medications:     Allergies:  Hehas No Known Allergies.    Home Medications       Med List Status: Complete Set By: Madelynn Done, LPN at 54/07/8118  9:27 PM              acetaminophen (TYLENOL) 325 MG tablet     Take 650 mg by mouth every 6 (six) hours as needed for Pain     apixaban (Eliquis) 5 MG     Take 5 mg by mouth every 12 (twelve) hours     carboxymethylcellulose, PF, (REFRESH PLUS)  0.5 % ophthalmic solution     Place 1 drop into both eyes 3 (three) times daily as needed     dilTIAZem (CARDIZEM) 120 MG tablet     Take 120 mg by mouth daily     empagliflozin (JARDIANCE) 25 MG tablet     Take 12.5 mg by mouth every morning     furosemide (LASIX) 40 MG tablet     Take 40 mg by mouth daily     loratadine (CLARITIN) 10 MG tablet     Take 10 mg by mouth daily     Multiple Vitamins-Minerals (CENTRUM SILVER 50+MEN PO)     Take 1 tablet by mouth daily     spironolactone (ALDACTONE) 25 MG tablet     Take 12.5 mg by mouth daily                 Past History:     Medical:   Past Medical History:   Diagnosis Date    Atrial fibrillation        Surgical: He has a past surgical history that includes Ablation of dysrhythmic focus.    Family: History reviewed. No pertinent family history.    Social: He reports that he  has never smoked. He has never used smokeless tobacco. He reports current alcohol use. No history on file for drug use.       Review of Systems:     Review of Systems   Constitutional:  Negative for chills and fever.   HENT:  Negative for congestion.    Respiratory:  Positive for shortness of breath. Negative for cough.    Cardiovascular:  Negative for chest pain and leg swelling.   Gastrointestinal:  Positive for abdominal pain. Negative for diarrhea, nausea and vomiting.   Genitourinary:  Negative for dysuria.   Skin:  Negative for rash.   Neurological:  Positive for dizziness. Negative for weakness and headaches.       All other systems reviewed and negative except what is specifically documented above.      Physical Exam:     Pulse 73  BP 153/88  Resp    SpO2 95 %  Temp 97.8 F (36.6 C)     Physical Exam  Vitals and nursing note reviewed.   Constitutional:       General: He is not in acute distress.     Appearance: Normal appearance.   HENT:      Head: Normocephalic and atraumatic.      Mouth/Throat:      Mouth: Mucous membranes are moist.   Eyes:      Pupils: Pupils are equal, round, and  reactive to light.   Cardiovascular:      Rate and Rhythm: Normal rate and regular rhythm.      Heart sounds: No murmur heard.     No friction rub. No gallop.   Pulmonary:      Effort: Pulmonary effort is normal. No respiratory distress.      Breath sounds: Normal breath sounds. No wheezing, rhonchi or rales.   Abdominal:      General: Abdomen is flat. There is no distension.      Palpations: Abdomen is soft.      Tenderness: There is no abdominal tenderness. There is no guarding or rebound.   Musculoskeletal:      Cervical back: Normal range of motion.      Comments: Trace edema legs bilaterally.   Skin:     General: Skin is warm and dry.   Neurological:      Mental Status: He is alert and oriented to person, place, and time.   Psychiatric:         Mood and Affect: Mood normal.         Behavior: Behavior normal.             MDM:    Presenting acute/chronic problems: ***    Number and Complexity of Problems Addressed (select at least one)  {Complexity:58839}    Initial Differential Diagnosis:  Initial differential diagnosis to include but not limited to: ***    Chronic illness impacting care: (obesity, elderly - state impact) ***    History obtained from another historian: ***    Review of older external records on ***     Plan:  Labs ordered include ***  Imaging ordered include ***  Pain control with ***  Meds: ***  Plan to consult ***    EKG Interpretation***  EKG interpreted independently by Carlus Pavlov, MD    No significant change from previous EKG on record.    CXR Interpretation***  CXR interpreted independently by Carlus Pavlov, MD  There is no focal consolidation, pneumothorax, or signs of volume overload.  Diagnostic tests appropriately considered even if not ultimately performed: N/A***    Discussion of test interpretation with external physician/consultant/hospitalist :  N/A***           ______________________________________________________________________  Amount/complexity of Data Reviewed    {Tip - Level 4 = (1/3 of the following categories); Level 5 = (2/3 categories).   This message will disappear upon signing. :58852}  1) {Tests, documents, independent historians (any 3) *Each unique test, order or document counts*:58854}  2) {Independent interpretation of tests:58853}  3) {Discussion of management or test interpretation with external provider:58855}    Risk of Complications and/or Morbidity or Mortality of Patient Management  (select one if applicable)    {ZOXW:96045}    Care affected by social determinant of health (access to medical care, homeless, lack of resources): N/A***    Consideration of admission or escalation of hospital level care in discharged patient: N/A***           Diagnostic Results:   Laboratory Studies:  All lab values have been personally reviewed by me***    Results       Procedure Component Value Units Date/Time    High Sensitivity Troponin-I at 2 hrs with calculated Delta [[id number]] Collected: 03/10/22 2240    Specimen: Blood Updated: 03/10/22 2302    Urinalysis Reflex to Microscopic Exam- Reflex to Culture [[id number]]  (Abnormal) Collected: 03/10/22 2149    Specimen: Urine, Clean Catch Updated: 03/10/22 2201     Urine Type Urine, Clean Ca     Color, UA Yellow     Clarity, UA Clear     Specific Gravity UA 1.018     Urine pH 6.0     Leukocyte Esterase, UA Negative     Nitrite, UA Negative     Protein, UR Negative     Glucose, UA >=500     Ketones UA Negative     Urobilinogen, UA Normal mg/dL      Bilirubin, UA Negative     Blood, UA Negative    D-Dimer [[id number]] Collected: 03/10/22 2028     Updated: 03/10/22 2201     D-Dimer 0.44 ug/mL FEU     High Sensitivity Troponin-I at 0 hrs [[id number]] Collected: 03/10/22 2028    Specimen: Blood Updated: 03/10/22 2102     hs Troponin-I 4.4 ng/L     Lipase [[id number]] Collected: 03/10/22 2028    Specimen: Blood Updated: 03/10/22 2058     Lipase 46 U/L     GFR [[id number]] Collected: 03/10/22 2028     Updated: 03/10/22 2058     EGFR  58.9       Comprehensive metabolic panel [[id number]]  (Abnormal) Collected: 03/10/22 2028    Specimen: Blood Updated: 03/10/22 2058     Glucose 141 mg/dL      BUN 75.6 mg/dL      Creatinine 1.2 mg/dL      Sodium 433 mEq/L      Potassium 4.2 mEq/L      Chloride 101 mEq/L      CO2 25 mEq/L      Calcium 9.6 mg/dL      Protein, Total 6.9 g/dL      Albumin 4.1 g/dL      AST (SGOT) 16 U/L      ALT 17 U/L      Alkaline Phosphatase 111 U/L      Bilirubin, Total 0.4 mg/dL      Globulin 2.8 g/dL      Albumin/Globulin Ratio 1.5  Anion Gap 11.0    CBC and differential [[id number]]  (Abnormal) Collected: 03/10/22 2028    Specimen: Blood Updated: 03/10/22 2036     WBC 6.51 x10 3/uL      Hgb 18.0 g/dL      Hematocrit 40.9 %      Platelets 164 x10 3/uL      RBC 5.66 x10 6/uL      MCV 91.3 fL      MCH 31.8 pg      MCHC 34.8 g/dL      RDW 14 %      MPV 11.6 fL      Instrument Absolute Neutrophil Count 4.52 x10 3/uL      Neutrophils 69.4 %      Lymphocytes Automated 21.2 %      Monocytes 8.1 %      Eosinophils Automated 0.6 %      Basophils Automated 0.5 %      Immature Granulocytes 0.2 %      Nucleated RBC 0.0 /100 WBC      Neutrophils Absolute 4.52 x10 3/uL      Lymphocytes Absolute Automated 1.38 x10 3/uL      Monocytes Absolute Automated 0.53 x10 3/uL      Eosinophils Absolute Automated 0.04 x10 3/uL      Basophils Absolute Automated 0.03 x10 3/uL      Immature Granulocytes Absolute 0.01 x10 3/uL      Absolute NRBC 0.00 x10 3/uL             Radiology Studies: ***    Chest AP Portable   Final Result      No acute cardiopulmonary disease.      Collene Schlichter, MD   03/10/2022 10:43 PM      CT Head without Contrast    (Results Pending)   CT Angio AAA Chest/ Abdomen    (Results Pending)        Orders Placed During This Visit:   Encounter Orders:  Orders Placed This Encounter   Procedures    Chest AP Portable    CT Head without Contrast    CT Angio AAA Chest/ Abdomen    CBC and differential    Comprehensive metabolic panel    Lipase     Urinalysis Reflex to Microscopic Exam- Reflex to Culture    High Sensitivity Troponin-I at 0 hrs    High Sensitivity Troponin-I at 2 hrs with calculated Delta    GFR    D-Dimer    Diet NPO effective now    ECG 12 lead    Saline lock IV       Encounter Medications:  Medications   metoclopramide (REGLAN) injection 10 mg (10 mg Intravenous Given 03/10/22 2158)        Diagnosis/Disposition:   Final Impression***  Final diagnoses:   None     Disposition  ED Disposition       None          Follow up  No follow-up provider specified.  Prescriptions  New Prescriptions    No medications on file         Interpretations, Clinical Decision Tools and Critical Care:   ***           Procedures:   Procedures              This patient was seen and evaluated during the SARS-CoV-2 pandemic.The following personal protective equipment protective eyewear, surgical N95 mask, gown and disposable facemask  were used as needed      ATTESTATIONS   Documentation Notes:    Parts of this note were generated by the Epic EMR system/ Dragon speech recognition and may contain inherent errors or omissions not intended by the user. Grammatical errors, random word insertions, deletions, pronoun errors and incomplete sentences are occasional consequences of this technology due to software limitations. Not all errors are caught or corrected.    My documentation is often completed after the patient is no longer under my clinical care. In some cases, the Epic EMR may pull updated results into the above documentation which may not reflect all results or information that was available to me at the time of my medical decision making.     If there are questions or concerns about the content of this note or information contained within the body of this dictation they should be addressed directly with the author for clarification.

## 2022-03-11 ENCOUNTER — Observation Stay: Payer: Non-veteran care

## 2022-03-11 ENCOUNTER — Emergency Department: Payer: Non-veteran care

## 2022-03-11 ENCOUNTER — Encounter (INDEPENDENT_AMBULATORY_CARE_PROVIDER_SITE_OTHER): Payer: Self-pay

## 2022-03-11 DIAGNOSIS — R251 Tremor, unspecified: Secondary | ICD-10-CM

## 2022-03-11 DIAGNOSIS — R0609 Other forms of dyspnea: Secondary | ICD-10-CM

## 2022-03-11 DIAGNOSIS — I480 Paroxysmal atrial fibrillation: Secondary | ICD-10-CM

## 2022-03-11 DIAGNOSIS — Z7901 Long term (current) use of anticoagulants: Secondary | ICD-10-CM

## 2022-03-11 DIAGNOSIS — I5032 Chronic diastolic (congestive) heart failure: Secondary | ICD-10-CM

## 2022-03-11 DIAGNOSIS — R0602 Shortness of breath: Secondary | ICD-10-CM

## 2022-03-11 DIAGNOSIS — E669 Obesity, unspecified: Secondary | ICD-10-CM

## 2022-03-11 DIAGNOSIS — R079 Chest pain, unspecified: Secondary | ICD-10-CM | POA: Diagnosis present

## 2022-03-11 DIAGNOSIS — I639 Cerebral infarction, unspecified: Secondary | ICD-10-CM

## 2022-03-11 DIAGNOSIS — I712 Thoracic aortic aneurysm, without rupture, unspecified: Secondary | ICD-10-CM

## 2022-03-11 DIAGNOSIS — Z6832 Body mass index (BMI) 32.0-32.9, adult: Secondary | ICD-10-CM

## 2022-03-11 DIAGNOSIS — I10 Essential (primary) hypertension: Secondary | ICD-10-CM

## 2022-03-11 DIAGNOSIS — E785 Hyperlipidemia, unspecified: Secondary | ICD-10-CM

## 2022-03-11 DIAGNOSIS — E782 Mixed hyperlipidemia: Secondary | ICD-10-CM

## 2022-03-11 LAB — ECG 12-LEAD
Atrial Rate: 68 {beats}/min
P-R Interval: 240 ms
Q-T Interval: 390 ms
QRS Duration: 72 ms
QTC Calculation (Bezet): 414 ms
T Axis: -12 degrees
Ventricular Rate: 68 {beats}/min

## 2022-03-11 LAB — HIGH SENSITIVITY TROPONIN-I WITH DELTA
hs Troponin-I Delta: -0.2 ng/L
hs Troponin-I: 3 ng/L

## 2022-03-11 LAB — CBC AND DIFFERENTIAL
Absolute NRBC: 0 10*3/uL (ref 0.00–0.00)
Absolute NRBC: 0 10*3/uL (ref 0.00–0.00)
Basophils Absolute Automated: 0.02 10*3/uL (ref 0.00–0.08)
Basophils Absolute Automated: 0.01 10*3/uL (ref 0.00–0.08)
Basophils Automated: 0.4 %
Basophils Automated: 0.2 %
Eosinophils Absolute Automated: 0.06 10*3/uL (ref 0.00–0.44)
Eosinophils Absolute Automated: 0.06 10*3/uL (ref 0.00–0.44)
Eosinophils Automated: 1.2 %
Eosinophils Automated: 1.2 %
Hematocrit: 50.6 % — ABNORMAL HIGH (ref 37.6–49.6)
Hematocrit: 49.9 % — ABNORMAL HIGH (ref 37.6–49.6)
Hgb: 17 g/dL (ref 12.5–17.1)
Hgb: 17 g/dL (ref 12.5–17.1)
Immature Granulocytes Absolute: 0.03 10*3/uL (ref 0.00–0.07)
Immature Granulocytes Absolute: 0.01 10*3/uL (ref 0.00–0.07)
Immature Granulocytes: 0.6 %
Immature Granulocytes: 0.2 %
Instrument Absolute Neutrophil Count: 3.2 10*3/uL (ref 1.10–6.33)
Instrument Absolute Neutrophil Count: 3.41 10*3/uL (ref 1.10–6.33)
Lymphocytes Absolute Automated: 1.32 10*3/uL (ref 0.42–3.22)
Lymphocytes Absolute Automated: 1.08 10*3/uL (ref 0.42–3.22)
Lymphocytes Automated: 25.6 %
Lymphocytes Automated: 21.6 %
MCH: 31.4 pg (ref 25.1–33.5)
MCH: 31.5 pg (ref 25.1–33.5)
MCHC: 33.6 g/dL (ref 31.5–35.8)
MCHC: 34.1 g/dL (ref 31.5–35.8)
MCV: 93.4 fL (ref 78.0–96.0)
MCV: 92.6 fL (ref 78.0–96.0)
MPV: 11.9 fL (ref 8.9–12.5)
MPV: 11.9 fL (ref 8.9–12.5)
Monocytes Absolute Automated: 0.52 10*3/uL (ref 0.21–0.85)
Monocytes Absolute Automated: 0.43 10*3/uL (ref 0.21–0.85)
Monocytes: 10.1 %
Monocytes: 8.6 %
Neutrophils Absolute: 3.2 10*3/uL (ref 1.10–6.33)
Neutrophils Absolute: 3.41 10*3/uL (ref 1.10–6.33)
Neutrophils: 62.1 %
Neutrophils: 68.2 %
Nucleated RBC: 0 /100 WBC (ref 0.0–0.0)
Nucleated RBC: 0 /100 WBC (ref 0.0–0.0)
Platelets: 148 10*3/uL (ref 142–346)
Platelets: 140 10*3/uL — ABNORMAL LOW (ref 142–346)
RBC: 5.42 10*6/uL (ref 4.20–5.90)
RBC: 5.39 10*6/uL (ref 4.20–5.90)
RDW: 14 % (ref 11–15)
RDW: 14 % (ref 11–15)
WBC: 5.15 10*3/uL (ref 3.10–9.50)
WBC: 5 10*3/uL (ref 3.10–9.50)

## 2022-03-11 LAB — BASIC METABOLIC PANEL
Anion Gap: 10 (ref 5.0–15.0)
BUN: 18 mg/dL (ref 9.0–28.0)
CO2: 27 mEq/L (ref 17–29)
Calcium: 9.3 mg/dL (ref 7.9–10.2)
Chloride: 101 mEq/L (ref 99–111)
Creatinine: 1.3 mg/dL (ref 0.5–1.5)
Glucose: 108 mg/dL — ABNORMAL HIGH (ref 70–100)
Potassium: 4 mEq/L (ref 3.5–5.3)
Sodium: 138 mEq/L (ref 135–145)

## 2022-03-11 LAB — COMPREHENSIVE METABOLIC PANEL
ALT: 13 U/L (ref 0–55)
AST (SGOT): 13 U/L (ref 5–41)
Albumin/Globulin Ratio: 1.5 (ref 0.9–2.2)
Albumin: 3.8 g/dL (ref 3.5–5.0)
Alkaline Phosphatase: 99 U/L (ref 37–117)
Anion Gap: 8 (ref 5.0–15.0)
BUN: 15 mg/dL (ref 9.0–28.0)
Bilirubin, Total: 0.5 mg/dL (ref 0.2–1.2)
CO2: 26 mEq/L (ref 17–29)
Calcium: 9.2 mg/dL (ref 7.9–10.2)
Chloride: 104 mEq/L (ref 99–111)
Creatinine: 1.1 mg/dL (ref 0.5–1.5)
Globulin: 2.5 g/dL (ref 2.0–3.6)
Glucose: 113 mg/dL — ABNORMAL HIGH (ref 70–100)
Potassium: 4.1 mEq/L (ref 3.5–5.3)
Protein, Total: 6.3 g/dL (ref 6.0–8.3)
Sodium: 138 mEq/L (ref 135–145)

## 2022-03-11 LAB — HEMOGLOBIN A1C
Average Estimated Glucose: 114 mg/dL
Hemoglobin A1C: 5.6 % (ref 4.6–5.6)

## 2022-03-11 LAB — GFR
EGFR: 53.7
EGFR: >60

## 2022-03-11 LAB — HIGH SENSITIVITY TROPONIN-I: hs Troponin-I: 3.2 ng/L

## 2022-03-11 LAB — GLUCOSE WHOLE BLOOD - POCT: Whole Blood Glucose POCT: 133 mg/dL — ABNORMAL HIGH (ref 70–100)

## 2022-03-11 LAB — PROBNP: NT-proBNP: 74 pg/mL (ref 0–450)

## 2022-03-11 MED ORDER — ONDANSETRON HCL 4 MG/2ML IJ SOLN
4.0000 mg | Freq: Four times a day (QID) | INTRAMUSCULAR | Status: DC | PRN
Start: 2022-03-11 — End: 2022-03-12

## 2022-03-11 MED ORDER — MELATONIN 3 MG PO TABS
3.0000 mg | ORAL_TABLET | Freq: Every evening | ORAL | Status: DC | PRN
Start: 2022-03-11 — End: 2022-03-12

## 2022-03-11 MED ORDER — EMPAGLIFLOZIN 25 MG PO TABS
12.5000 mg | ORAL_TABLET | Freq: Every morning | ORAL | Status: DC
Start: 2022-03-11 — End: 2022-03-12
  Administered 2022-03-11 – 2022-03-12 (×2): 12.5 mg via ORAL
  Filled 2022-03-11 (×2): qty 1

## 2022-03-11 MED ORDER — SODIUM CHLORIDE 0.9 % IV SOLN
INTRAVENOUS | Status: AC
Start: 2022-03-11 — End: 2022-03-11

## 2022-03-11 MED ORDER — HYDRALAZINE HCL 20 MG/ML IJ SOLN
10.0000 mg | INTRAMUSCULAR | Status: DC | PRN
Start: 2022-03-11 — End: 2022-03-12

## 2022-03-11 MED ORDER — DILTIAZEM HCL 60 MG PO TABS
120.0000 mg | ORAL_TABLET | Freq: Every day | ORAL | Status: DC
Start: 2022-03-11 — End: 2022-03-11

## 2022-03-11 MED ORDER — CALCIUM CARBONATE ANTACID 500 MG PO CHEW
1000.0000 mg | CHEWABLE_TABLET | Freq: Four times a day (QID) | ORAL | Status: DC | PRN
Start: 2022-03-11 — End: 2022-03-12
  Filled 2022-03-11: qty 2

## 2022-03-11 MED ORDER — ASPIRIN 300 MG RE SUPP
300.0000 mg | Freq: Every day | RECTAL | Status: DC
Start: 2022-03-11 — End: 2022-03-11

## 2022-03-11 MED ORDER — ATORVASTATIN CALCIUM 20 MG PO TABS
40.0000 mg | ORAL_TABLET | Freq: Every evening | ORAL | Status: DC
Start: 2022-03-11 — End: 2022-03-12
  Administered 2022-03-11: 40 mg via ORAL
  Filled 2022-03-11: qty 2

## 2022-03-11 MED ORDER — SPIRONOLACTONE 25 MG PO TABS
12.5000 mg | ORAL_TABLET | Freq: Every day | ORAL | Status: DC
Start: 2022-03-11 — End: 2022-03-12
  Administered 2022-03-11 – 2022-03-12 (×2): 12.5 mg via ORAL
  Filled 2022-03-11 (×2): qty 1

## 2022-03-11 MED ORDER — ASPIRIN 81 MG PO CHEW
81.0000 mg | CHEWABLE_TABLET | Freq: Every day | ORAL | Status: DC
Start: 2022-03-11 — End: 2022-03-11
  Administered 2022-03-11: 81 mg via ORAL
  Filled 2022-03-11: qty 1

## 2022-03-11 MED ORDER — POTASSIUM & SODIUM PHOSPHATES 280-160-250 MG PO PACK
2.0000 | PACK | ORAL | Status: DC | PRN
Start: 2022-03-11 — End: 2022-03-12

## 2022-03-11 MED ORDER — ONDANSETRON 4 MG PO TBDP
4.0000 mg | ORAL_TABLET | Freq: Four times a day (QID) | ORAL | Status: DC | PRN
Start: 2022-03-11 — End: 2022-03-12

## 2022-03-11 MED ORDER — POTASSIUM CHLORIDE CRYS ER 20 MEQ PO TBCR
0.0000 meq | EXTENDED_RELEASE_TABLET | ORAL | Status: DC | PRN
Start: 2022-03-11 — End: 2022-03-12

## 2022-03-11 MED ORDER — POTASSIUM CHLORIDE 10 MEQ/100ML IV SOLN
10.0000 meq | INTRAVENOUS | Status: DC | PRN
Start: 2022-03-11 — End: 2022-03-12

## 2022-03-11 MED ORDER — APIXABAN 5 MG PO TABS
5.0000 mg | ORAL_TABLET | Freq: Two times a day (BID) | ORAL | Status: DC
Start: 2022-03-11 — End: 2022-03-12
  Administered 2022-03-11 – 2022-03-12 (×3): 5 mg via ORAL
  Filled 2022-03-11 (×3): qty 1

## 2022-03-11 MED ORDER — IOHEXOL 350 MG/ML IV SOLN
100.0000 mL | Freq: Once | INTRAVENOUS | Status: AC | PRN
Start: 2022-03-11 — End: 2022-03-11
  Administered 2022-03-11: 100 mL via INTRAVENOUS

## 2022-03-11 MED ORDER — ACETAMINOPHEN 650 MG RE SUPP
650.0000 mg | Freq: Four times a day (QID) | RECTAL | Status: DC | PRN
Start: 2022-03-11 — End: 2022-03-12

## 2022-03-11 MED ORDER — IOHEXOL 350 MG/ML IV SOLN
85.0000 mL | Freq: Once | INTRAVENOUS | Status: AC | PRN
Start: 2022-03-11 — End: 2022-03-11
  Administered 2022-03-11: 85 mL via INTRAVENOUS

## 2022-03-11 MED ORDER — CETIRIZINE HCL 10 MG PO TABS
10.0000 mg | ORAL_TABLET | Freq: Every day | ORAL | Status: DC
Start: 2022-03-11 — End: 2022-03-12
  Administered 2022-03-11 – 2022-03-12 (×2): 10 mg via ORAL
  Filled 2022-03-11 (×2): qty 1

## 2022-03-11 MED ORDER — MAGNESIUM SULFATE IN D5W 1-5 GM/100ML-% IV SOLN
1.0000 g | INTRAVENOUS | Status: DC | PRN
Start: 2022-03-11 — End: 2022-03-12

## 2022-03-11 MED ORDER — LABETALOL HCL 5 MG/ML IV SOLN (WRAP)
10.0000 mg | INTRAVENOUS | Status: DC | PRN
Start: 2022-03-11 — End: 2022-03-12

## 2022-03-11 MED ORDER — ACETAMINOPHEN 325 MG PO TABS
650.0000 mg | ORAL_TABLET | Freq: Four times a day (QID) | ORAL | Status: DC | PRN
Start: 2022-03-11 — End: 2022-03-12

## 2022-03-11 MED ORDER — DILTIAZEM HCL ER COATED BEADS 120 MG PO CP24
120.0000 mg | ORAL_CAPSULE | Freq: Every day | ORAL | Status: DC
Start: 2022-03-11 — End: 2022-03-12
  Administered 2022-03-11 – 2022-03-12 (×2): 120 mg via ORAL
  Filled 2022-03-11 (×2): qty 1

## 2022-03-11 MED ORDER — NALOXONE HCL 0.4 MG/ML IJ SOLN (WRAP)
0.2000 mg | INTRAMUSCULAR | Status: DC | PRN
Start: 2022-03-11 — End: 2022-03-12

## 2022-03-11 MED ORDER — SODIUM PHOSPHATES 3 MMOLE/ML IV SOLN (WRAP)
20.0000 mmol | INTRAVENOUS | Status: DC | PRN
Start: 2022-03-11 — End: 2022-03-12

## 2022-03-11 MED ORDER — NITROGLYCERIN 0.4 MG SL SUBL
0.4000 mg | SUBLINGUAL_TABLET | SUBLINGUAL | Status: DC | PRN
Start: 2022-03-11 — End: 2022-03-12

## 2022-03-11 MED ORDER — FUROSEMIDE 20 MG PO TABS
40.0000 mg | ORAL_TABLET | Freq: Every day | ORAL | Status: DC
Start: 2022-03-11 — End: 2022-03-12
  Administered 2022-03-11 – 2022-03-12 (×2): 40 mg via ORAL
  Filled 2022-03-11 (×2): qty 2

## 2022-03-11 MED ORDER — CARBOXYMETHYLCELLULOSE SODIUM 0.5 % OP SOLN
1.0000 [drp] | Freq: Three times a day (TID) | OPHTHALMIC | Status: DC | PRN
Start: 2022-03-11 — End: 2022-03-12
  Administered 2022-03-11: 1 [drp] via OPHTHALMIC
  Filled 2022-03-11: qty 1

## 2022-03-11 MED ORDER — VITAMINS/MINERALS PO TABS
1.0000 | ORAL_TABLET | Freq: Every day | ORAL | Status: DC
Start: 2022-03-11 — End: 2022-03-12
  Administered 2022-03-11 – 2022-03-12 (×2): 1 via ORAL
  Filled 2022-03-11 (×2): qty 1

## 2022-03-11 NOTE — Discharge Instructions (Signed)
______________________________________________    [employer] and Extended Care      [facility]    All enrolled Veterans are eligible for these services. However, to get the service you must have a clinical need for it, and the service must be available in your location. Services in the [place] Standard Benefits Package include:    Geriatric Evaluation to assess your care needs and to create a care plan  Adult Day Health Care  Homemaker and Home Health Aide Care  Respite Care  Skilled Home Health Care    Residential Settings and Nursing Homes have different eligibility requirements for each setting. The [facility] does not pay for room and board in residential settings such as Assisted Living or Adult Family Homes. However, you may receive some Services at Home and in the Community while you are living in a residential setting. The [facility] also provides [employer] ([place] Nursing Home) or community nursing home care IF you meet certain eligibility criteria involving your service-connected status, level of disability, and income.    Factors that Affect Your Costs for [facility] Services    When you enroll in [place] health care, your [place] service-connected disability status and income will be reviewed to determine if you will be charged a copay. Long term care copays are not charged until the 22nd day of care. Copays are NOT charged for Hospice Care provided in any setting. However, the [facility] is required by law to bill any health insurance you may have (except Medicare) for treatment of conditions that are not service connected. Payments received from your insurance company may reduce the copays that [facility] bills to you.    How do I apply for long term care services in [place]?    Standard Medical Benefits    Veterans must be enrolled in [place] health care before applying for [facility] long term care services, which means you have applied for [place] health care benefits and receive care through a [place] facility on a regular  basis.  Receiving financial compensation for a [place] disability does NOT automatically enroll you in [place] health care.  You may be eligible for [facility] health care services (known as the Standard Medical Benefits Package) if you served in the [employer] and were discharged for any reason other than dishonorable.  Detailed eligibility information can be found on the main [facility] Health Benefits website.    To Enroll in Health Care Services    Visit your local [facility] medical center's New Patient Registration office to complete and submit the [place] Application for Health Benefits, or  Enroll online: Online Application for Health Benefits    Am I eligible for [place] health care benefits?    You may be eligible for [place] health care benefits if you served in the active [employer], Higher education careers adviser, or air service and didn't receive a dishonorable discharge.    If you enlisted after July 24, 1979, or entered active duty after August 31, 1980  You must have served 24 continuous months or the full period for which you were called to active duty, unless any of the descriptions below are true for you.    This minimum duty requirement may not apply if any of these are true:    You were discharged for a disability that was caused--or made worse--by your active-duty service, or  You were discharged for a hardship or "early out," or  You served prior to July 24, 1979  If you're a current or former member  of the Reserves or [employer] must have been called to active duty by a federal order and completed the full period for which you were called or ordered to active duty. If you had or have active-duty status for training purposes only, you don't qualify for [place] health care.    If you served in certain locations and time periods during the [place] War era  You're eligible for [place] health care. Keep reading to learn more.    Intake Site At [employer] the Web Site  Regional Benefit Office  [address]  [address] [place], [place]  [address]    Phone: [phone]  Driving Directions  Presence at Facility and hours of operation    Monday  9:00 - Noon, first come first serve      ____________________________________________________________________________________________    [facility]  Lynn Ito   [phone]  [email or web address]

## 2022-03-11 NOTE — Consults (Signed)
[facility] CARDI[facility]Y CONSULTATION REPORT    [facility]  [place] [address]  [address]  [place] [address]  Dept: [phone]  Dept Fax: [phone]  Loc: [phone]      Date Time: 03/11/22 8:02 AM  Patient Name: Jason Bullock  Requesting Physician: Bobbye Charleston, MD    Reason for Consultation:   Exertional dyspnea    History of Present Illness:   Jason Bullock is a 76 y.o. male admitted on 03/10/2022.  We have been asked by Bobbye Charleston, MD to provide cardiac consultation regarding exertional dyspnea.    Patient is a 76 year old man with a history of pAF s/p multiple ablations, HFpEF, HTN, HLD who we were asked to see for exertional dyspnea.  Patient reported that he has some degree of exertional dyspnea at baseline and difficulty ambulating due to balance problems and poor hand-eye coordination.  He noted that yesterday his dyspnea was worse than his typical baseline, exacerbated by walking.  He also experienced some substernal chest discomfort radiating to the left arm, which he has also experienced in the past, but was worse yesterday than usual.  He said that this discomfort was not worsened with exertion.  It lasted for over 6 hours yesterday before resolving.    On presentation to the ED, BP was 153/88 with otherwise stable VS.  ECG was unremarkable.  NT-proBNP was 74 pg/mL.  Trops were neg x2.  CTA chest redemonstrated known thoracic aortic aneurysm without dissection.      Cardiology relevant medical records in Epic chart reviewed.    Past Medical/Surgical History:   Patient  has a past medical history of Atrial fibrillation.    Patient  has a past surgical history that includes Ablation of dysrhythmic focus.    Family History:   Patient's family history is not on file.    Social History:   Patient  reports that he has never smoked. He has never used smokeless tobacco. He reports current alcohol use.    Allergies:   No Known Allergies    Medications:      Current Outpatient Medications   Medication Instructions    acetaminophen (TYLENOL) 650 mg, Oral, Every 6 hours PRN    apixaban (ELIQUIS) 5 mg, Oral, Every 12 hours    carboxymethylcellulose, PF, (REFRESH PLUS) 0.5 % ophthalmic solution 1 drop, Both Eyes, 3 times daily PRN    dilTIAZem (CARDIZEM) 120 mg, Oral, Daily    empagliflozin (JARDIANCE) 12.5 mg, Oral, Every morning    furosemide (LASIX) 40 mg, Oral, Daily    loratadine (CLARITIN) 10 mg, Oral, Daily    Multiple Vitamins-Minerals (CENTRUM SILVER 50+MEN PO) 1 tablet, Oral, Daily    spironolactone (ALDACTONE) 12.5 mg, Oral, Daily       Inpatient Scheduled Meds: PRN Meds:    apixaban, 5 mg, Oral, Q12H SCH        Continuous Infusions:   acetaminophen, 650 mg, Q6H PRN   Or  acetaminophen, 650 mg, Q6H PRN  calcium carbonate, 1,000 mg, Q6H PRN  magnesium sulfate, 1 g, PRN  melatonin, 3 mg, QHS PRN  naloxone, 0.2 mg, PRN  ondansetron, 4 mg, Q6H PRN   Or  ondansetron, 4 mg, Q6H PRN  potassium & sodium phosphates, 2 packet, PRN   And  sodium phosphates 20 mmol in dextrose 5 % 250 mL IVPB, 20 mmol, PRN  potassium chloride, 0-40 mEq, PRN   And  potassium chloride, 10 mEq, PRN  Physical Exam:     Vitals:    03/11/22 0330 03/11/22 0400 03/11/22 0600 03/11/22 0700   BP:  112/75 120/82 123/87   Pulse: 71 (!) 56     Resp: 16 13     Temp:       TempSrc:       SpO2: 95% 94%     Weight:       Height:           Constitutional: Cooperative, alert, no acute distress.  Neck: No carotid bruits, JVP normal.  Cardiac: Regular rate and rhythm, normal S1 and S2; no S3 or S4, no murmurs, no rubs, no gallops.  Pulmonary: Clear to auscultation bilaterally, no wheezing, no rhonchi, no rales.  Extremities: no edema.  Vascular: +2 pulses in radial artery bilaterally, 2+ pedal pulses bilaterally.    Lab/Test Findings:     ECG reviewed: Normal sinus rhythm.  No ST segment or T wave abnormalities.    CXR reviewed: No acute abnormality    Recent Labs     03/11/22  0440 03/10/22  2240  03/10/22  2028   Sodium 138  --  137   Potassium 4.0  --  4.2   Chloride 101  --  101   CO2 27  --  25   BUN 18.0  --  16.0   Creatinine 1.3  --  1.2   Calcium 9.3  --  9.6   WBC 5.15  --  6.51   Hgb 17.0  --  18.0*   Hematocrit 50.6*  --  51.7*   Platelets 148  --  164   hs Troponin-I  --  4.1 4.4   hs Troponin-I Delta  --  -0.3  --    NT-proBNP 74  --   --    AST (SGOT)  --   --  16   ALT  --   --  17       ASSESSMENT:   Patient is a 76 y.o. male with the following relevant diagnoses:    Admitted April 2023 for exertional dyspnea and atypical chest discomfort.  Paroxysmal atrial fibrillation  CHADS2-Vasc 3 (HTN, age x2) on apixaban  S/p ablation 2015, 2017, 2021  HFpEF  Hypertension  Hyperlipidemia  Thoracic aortic aneurysm  CTA April 2023: Ascending Ao 4.7 cm  Echo 2021: Ascending Ao 4.8 cm, infrarenal abd Ao 3.4 cm.  Echo 2021: EF 60%, mildly dilated RV with normal function, trace AR, neg bubble study, TAA as above    RECOMMENDATIONS:     Initial cardiac evaluation for dyspnea/atypical chest pain is reassuring, with normal ECG, serial troponins, NT-proBNP and CTA chest.  Patient is euvolemic on exam with normal natriuretic peptides- low suspicion for CHF.  Recommend expedited outpatient stress test and echocardiogram.  Patient to contact his cardiologist in [place] to pursue this.  Would continue all cardiac medications.  Cardiology will sign off at this time.  Please do not hesitate to reach out with additional questions.      -------------------------------------------------------------------------------------  Signed by:         Verner Mould, MD         [facility]    [facility] Contact Information   [facility]  Secure Chat (Group):   [facility] [place] [facility]    APP Spectralink:  [phone]    MD Spectralink :  [phone]  [phone]    After hours, non urgent consult line:  [phone]    After hours, physician  on-call:  [phone] [facility]  Secure Chat (Group):   [facility] [place] [facility]    APP  Spectralink:  [phone]    MD Spectralink :  [phone]      After hours, non urgent consult line:  [phone]    After hours, physician on-call:  [phone] [facility]  Secure Chat (Group):   [facility]    APP Spectralink:  [phone]    MD Spectralink :  [phone]      After hours, non urgent consult line:  [phone]    After hours, physician on-call:  [phone] [facility]  Secure Chat (Group):   [place] [facility]    APP Spectralink:  [phone]    MD Spectralink :  [phone]      After hours, non urgent consult line:  [phone]    After hours, physician on-call:  [phone]       This note was generated by the Dragon speech recognition and may contain errors or omissions not intended by the user. Grammatical errors, random word insertions, deletions, pronoun errors, and incomplete sentences are occasional consequences of this technology due to software limitations. Not all errors are caught or corrected. If there are questions or concerns about the content of this note or information contained within the body of this dictation, they should be addressed directly with the author for clarification.

## 2022-03-11 NOTE — Plan of Care (Signed)
Problem: Chest Pain  Goal: Vital signs and cardiac rhythm stable  Outcome: Progressing  Goal: Cardiac pain management  Outcome: Progressing  Goal: Anxiety management/effective coping  Outcome: Progressing  Goal: Patient/Patient Care Companion demonstrates understanding of disease process, treatment plan, medications, and discharge plan  Outcome: Progressing     Problem: Side Effects from Pain Analgesia  Goal: Patient will experience minimal side effects of analgesic therapy  Outcome: Progressing     Problem: Pain interferes with ability to perform ADL  Goal: Pain at adequate level as identified by patient  Outcome: Progressing

## 2022-03-11 NOTE — Nursing Progress Note (Signed)
Nurse called into room after patient returned back from the bathroom. Patient complaints of chest pain, and left arm pain. EKG noted SR with bundle branch block.  VSS. Patient verbalized "muscle jumping in left hand"   NIH 1- Slurred speech noted.      Dr. Minus Liberty notified; Wanted to come and assess patient at bedside.    Dr. Minus Liberty assessment complete, code stroke called. Patient forgetful of receiving eliquis this morning.     IV fluids started, pt transferred to CT.       Eliquis given at [phone]

## 2022-03-11 NOTE — Discharge Instr - AVS First Page (Addendum)
Reason for your Hospital Admission:  Exertional dyspnea and atypical chest discomfort.  2.   Left arm shaking  3.  Paroxysmal atrial fibrillation S/p ablation 2015, 2017, 2021.  On Eliquis  4.  Hypertension  5.  Hyperlipidemia   6.  History of Thoracic aortic aneurysm. CTA April 2023: Ascending Ao 4.7 cm  Echo 2021: Ascending Ao 4.8 cm, infrarenal abd Ao 3.4 cm.  7.  HFpEF. Echo 2021: EF 60%, mildly dilated RV with normal function, trace AR, neg bubble study, TAA as above  8.  BMI=Body mass index is 32.64 kg/m.  Diagnosis: Obesity based on BMI criteria       Instructions for after your discharge:  1.  Activity as tolerated   2.  Diet: Low-salt low-cholesterol diet   3.  Please return to emergency department if you have symptoms including but not limited to chest pain, shortness of breath, dizziness, seizure, confusion, bleeding, dark/maroon-colored stool or any other concerning symptoms   4.  Follow-up with your cardiologist after being discharged from hospital for outpatient echocardiogram and stress test.  5.  Follow-up with your primary care physician after being discharged from hospital   6. Follow-up with neurologist after being discharged from hospital   7.  Medications 1.  Continue your home medications as prescribed by your doctors                            2.  Recommend avoiding NSAID pain medications including but not limited to Aleve, Motrin, Advil, naproxen, ibuprofen, Excedrin or other blood thinners while on Eliquis due to increased risk of bleeding  8.  Please do not drive vehicle or operate machinery until cleared from  neurologist  9.  Recommend taking video clips of your hand shaking for the reference of your neurologist.  10.  Seizure precaution: Very cautious with swimming, driving, heights, near hot/burning surfaces/fire  11.  Recommend to avoid dehydration, sleep deprivation, consumption of significant amount of alcohol

## 2022-03-11 NOTE — PT Eval Note (Signed)
Physical Therapy Evaluation  Patient: Jason Bullock    [id number]/R604.A  Discharge Recommendations:   Based on today's session: Home with outpatient PT (for gait training, high level alance and coordination)     If pt returns home, DME Recommended for Discharge: No additional equipment/DME recommended at this time      Assessment:   Jason Bullock is a 76 y.o. male admitted 03/10/2022.  Pt's functional mobility is at his maximal functional level.  There are no comorbidities or other factors that affect plan of care .  Standardized tests and exams incorporated into evaluation include AMPAC mobility and Berg Balance Test with indication of low risk for fall. Pt's functional level and balance is at his baseline. Pt demonstrates a stable clinical presentation. Patient does not require PT service at this time.     Complexity Level Hx and Co  morbidites Examination Clinical Decision Making Clinical Presentation   Low no impact 1-2 elements Limited options Stable   Moderate   1-2 factors 3 or more   Several options Evolving, plan may alter   High 3 or more 4 or more Multiple options Unstable, unpredictable       Impairments: Assessment: Appears to be at baseline for mobility;Appears to be at baseline for balance.         Plan:    Treatment/Interventions: No skilled interventions needed at this time PT Frequency: one time visit - therapy discontinued    Risks/Benefits/POC Discussed with Pt/Family: With patient          Goals: N/A           [place] [address] WEST [id number]    Time of treatment: Time Calculation  PT Received On: 03/11/22  Start Time: 1400  Stop Time: 1418  Time Calculation (min): 18 min    PT Visit Number: 1    Consult received for Jason Bullock for PT Evaluation and Treatment.  Patient's medical condition is appropriate for Physical therapy intervention at this time.    Precautions and Contraindications:   Precautions  Weight Bearing Status: no restrictions    Medical Diagnosis: Chest pain  [R07.9]    History of Present Illness: Jason Bullock is a 76 y.o. male admitted on 03/10/2022 with chest pain.     "76 year old man had chest pain followed by left hand movements that was no voluntary that resolved in seconds. Patient was not aware, but nurse that witnessed noticed that there was some confusion earlier today and during event that patient does not recall. There was no numbness or weakness or slurring of his speech during the event. Patient report that this has happened to him in the past multiple times. He has had stress cardiac evaluation. He feels like he is back to normal." (As per MD note)     Patient Active Problem List   Diagnosis    Chest pain        Past Medical/Surgical History:  Past Medical History:   Diagnosis Date    Atrial fibrillation       Past Surgical History:   Procedure Laterality Date    ABLATION OF DYSRHYTHMIC FOCUS           X-Rays/Tests/Labs:  CT Angiogram Head Neck    Result Date: 03/11/2022  1.No significant stenosis or large branch vessel occlusion is identified in the intracranial vasculature. 2.There is atherosclerotic disease of the carotid bifurcations, but there is no stenosis of the proximal internal carotid arteries based on NASCET  criteria.  A linear defect along the posterior margin left carotid bulb may reflect ulcerated plaque or potentially a carotid web. 3.The vertebrobasilar system is patent. 4.There is fusiform aneurysmal dilatation of the ascending aorta that measures up to approximately 4.8 cm transversely. This finding is better evaluated on the prior CTA of the chest/abdomen performed previously today. Nicoletta Dress, MD 03/11/2022 11:37 AM    CT Head WO Contrast    Result Date: 03/11/2022  1. No acute intracranial hemorrhage is detected. 2. There is mild subcortical, deep, and periventricular leukomalacia in both cerebral hemispheres.  This is suggestive of chronic small vessel ischemic change. Theodoro Doing, MD 03/11/2022 11:20 AM    CT Angio AAA  Chest/ Abdomen    Result Date: 03/11/2022  1. 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 Judd Gaudier, MD 03/11/2022 1:20 AM    CT Head without Contrast    Result Date: 03/11/2022  No acute intracranial abnormality. Judd Gaudier, MD 03/11/2022 1:12 AM    Chest AP Portable    Result Date: 03/10/2022  No acute cardiopulmonary disease. Collene Schlichter, MD 03/10/2022 10:43 PM       Social History:  Prior Level of Function  Prior level of function: Independent with ADLs, Ambulates independently  Baseline Activity Level: Community ambulation  Driving: independent  Cooking: microwave only  Employment: Retired  Cabin crew Arrangements: Alone  Type of Home: House  Home Layout: Able to live on main level with bedroom/bathroom, Performs ADL's on one level  Bathroom Shower/Tub: Pension scheme manager: Standard  Bathroom Equipment: Hand-held shower, Built-in shower seat      Subjective:    Patient is agreeable to participation in the therapy session. Nursing clears patient for therapy.   Patient Goal  Patient Goal: "To get rid of my clumsiness."  Pain Assessment  Pain Assessment: No/denies pain    Objective:   Observation of Patient/Vital Signs:  Patient is in bed with telemetry in place.    Inspection/Posture  Inspection/Posture: Unremarkable    Cognition/Neuro Status  Arousal/Alertness: Appropriate responses to stimuli  Attention Span: Appears intact  Orientation Level: Oriented X4  Memory: Appears intact  Following Commands: Follows all commands and directions without difficulty  Safety Awareness: independent  Insights: Educated in Engineer, building services  Behavior: calm;attentive;cooperative  Company secretary: intact  Coordination: intact  Hand Dominance: right handed      Hearing: wears hearing aide  Vision: WNL  Sensation: tingling to B/L feet     Gross ROM  Right Lower Extremity ROM: within functional  limits  Left Lower Extremity ROM: within functional limits  Gross Strength  Right Lower Extremity Strength: 4+/5  Left Lower Extremity Strength: 4+/5  Tone  Tone: within functional limits    Functional Mobility  Rolling: Independent  Supine to Sit: Independent  Scooting to EOB: Independent  Sit to Stand: Supervision  Stand to Sit: Supervision  Transfers  Bed to Chair: Supervision  Chair to Bed: Supervision  Stand Pivot Transfers: Supervision  Locomotion  Ambulation: Supervision  Pattern: decreased cadence;decreased step length;Step through  [employer] (ft) (Step 6,7): 100 Feet  PMP Activity: Step 7 - Walks out of Room     Balance  Balance: needs focused assessment  Sitting - Static: Good  Sitting - Dynamic: Good  Standing - Static: Good  Standing - Dynamic: Good    Participation and Endurance  Participation Effort: excellent  Endurance: Tolerates 10 - 20  min exercise with multiple rests    Berg Balance Scale  Sitting to Standing: 4  Standing Unsupported: 4  Sitting Unsupported: 4  Standing to Sitting: 4  Transfers: 4  Standing with Eyes Closed: 4  Standing with Feet Together: 4  Reaching Forward, Outstretched Arm: 4  Retrieving Object from Floor: 3  Turning to Look Behind: 3  Turning 360 Degrees: 3  Placing Alternate Foot on Stool: 3  Standing One Foot in Front: 3  Standing on One Foot: 3  Total Score: 50  Berg Balance Scale CMS 0-100%: 1% to < 20%  Patient presents with Sharlene Motts Balance Score of  50 which indicating patient is at  low risk for fall   Berg Balance Test Interpretation:   41-56 Low Risk for fall   21-40 Moderate Risk for fall   0-20   High Risk for fall        AM-PACT Inpatient Short Forms  Inpatient AM-PACT Performed? (PT): Basic Mobility Inpatient Short Form  AM-PACT "6 Clicks" Basic Mobility Inpatient Short Form  Turning Over in Bed: None  Sitting Down On/Standing From Armchair: None  Lying on Back to Sitting on Side of Bed: None  Assist Moving to/from Bed to Chair: None  Assist to Walk in  Hospital Room: A little  Assist to Climb 3-5 Steps with Railing: A little  PT Basic Mobility Raw Score: 22  CMS 0-100% Score: 20.91%    Instructed/educated on the signs/symptoms of a STROKE: ACT F.A.S.T. Pt verbalized understanding and able to recall information. Pt also educated on home safety and fall prevention      Educated the patient to role of physical therapy, plan of care, goals of therapy and safety with mobility and ADLs.      Therapist PPE during session procedural mask and gloves      Zaniah [place]ington Eric Form, DPT  [facility]   Physical Medicine and Rehabilitation Dept

## 2022-03-11 NOTE — Progress Notes (Signed)
[facility] [employer] (previously [facility]):     Received a referral to schedule a follow up appointment with the [facility] [employer].  Appointment scheduled for Thursday 03/19/22 at 10:40 am with NP Zaiter at [facility] location.      Clinic address is as follows:    [address] [place], [address]    Please notify patient to arrive 15 minutes early to the appointment and bring the following materials with them:    Insurance card (if insured) and photo ID  Medications in their original bottles  Glucometer/blood sugar log (if diabetic)  Weight log (if heart failure)  Proof of income (to enroll in medication assistance programs-first two pages of signed 1040 tax forms or last 2 months of pay stubs)      Dawayne Patricia  Patient Access Associate II  [facility] [employer]   [phone]

## 2022-03-11 NOTE — Nursing Progress Note (Signed)
Stroke orders discontinued per Dr. Minus Liberty orders.

## 2022-03-11 NOTE — Significant Event (Signed)
Code stroke was called with CC of   Dizziness   Lost control of LEFT arm slurred speech and transient  Memory impairment  Neuro exam: Currently alert awake oriented x3.  No acute distress.  Speech intact.  Cranial nerves II to XII intact bilateral.  Bilateral upper extremity muscle strength 5/5.  Bilateral lower extremity muscle strength 5/5.  Gait not checked    Case discussed with neurologist on-call Dr. Alvino Chapel   Discussed with patient; agreeable with CTA head neck and CT head and MRI.  Patient's GFR is 53.7.  Gentle IV hydration.  Patient received Eliquis today at 9 AM  Stroke protocol initiated    Discussed with patient and RN at the bedside.

## 2022-03-11 NOTE — Consults (Signed)
Neurology Consult Requested By:  Dr. Minus Liberty    Reason for consult:  Possible TIA    Chief Complaint:  Left hand shaking and transient loss of memory    History of Present Illness:  76 year old man had chest pain followed by left hand movements that was no voluntary that resolved in seconds.  Patient was not aware, but nurse that witnessed noticed that there was some confusion earlier today and during event that patient does not recall.  There was no numbness or weakness or slurring of his speech during the event.  Patient report that this has happened to him in the past multiple times.  He has had stress cardiac evaluation.  He feels like he is back to normal.    Neurological examination    Mental Status: Alert and oriented to place, person and time.    Language: fluent and no dysarthria  Cranial Nerves  Pupils equal, round and reactive light bilaterally, EMOI, visual field full to finger count, no nystagmus, no ptosis, face symmetric sensation, symmetric smile, hearing intact to finger rub bilaterally, palate elevate symmetric, tongue without atrophy and no weakness.   Motor Examination:  No pronator drift  Normal tone and bulk  Full 5/5 strength in arms and legs  Sensory Examination:  Intact to light touch bilaterally in the hands and feet  Coordination:  Intact Finger to nose and heel to shin    Diagnostic studies    Hgb A1C  LDL  B12  TSH    MRI brain:    CT head  FINDINGS: There is mild bilateral cerebral and cerebellar volume loss.       There is mild subcortical, deep, and periventricular leukomalacia in both cerebral hemispheres. No parenchymal mass, acute  intracranial hemorrhage, or hydrocephalus is detected.  No extra-axial (subdural or epidural) collection is seen.    The basilar   cisterns are clear.     The calvarium and skull base are intact. No skull base fracture or calvarial fracture is seen.   The soft tissues are unremarkable. The paranasal sinuses show mild mucosal thickening.     IMPRESSION:       1. No acute intracranial hemorrhage is detected.   2. There is mild subcortical, deep, and periventricular leukomalacia in both cerebral hemispheres.  This is suggestive of chronic small vessel ischemic change.      CTA head and neck     IMPRESSION:      1.No significant stenosis or large branch vessel occlusion is identified in the intracranial vasculature.  2.There is atherosclerotic disease of the carotid bifurcations, but there is no stenosis of the proximal internal carotid arteries based on NASCET criteria.  A linear defect along the posterior margin left carotid bulb may reflect ulcerated plaque or   potentially a carotid web.  3.The vertebrobasilar system is patent.  4.There is fusiform aneurysmal dilatation of the ascending aorta that measures up to approximately 4.8 cm transversely. This finding is better evaluated on the prior CTA of the chest/abdomen performed previously today.          ASSESSMENT AND RECOMMENDATION:    Transient confusion with left hand shaking - the event not consistent with TIA but more concern for partial seizure being that there is loss of memory of some recent event.   Patient has had multiple events with similar features where there is shaking of his hand that last less than a minute with resolution.    - He will get EEG  and see neurologist at [facility].  I have left a message for his [place] nurse about my recommendation.     This case was discussed with Dr. Minus Liberty today.    I have spent total of 60 minutes on this consultation today.

## 2022-03-11 NOTE — Nursing Progress Note (Addendum)
Received pt from the ER. Pt alert and oriented. No complaints of chest pain. VSS. Pt is up ad lib, with no weakness noted. Anticoagulation arm band applied.    Continue to monitor patients status

## 2022-03-11 NOTE — Nursing Progress Note (Signed)
TOR-BSST completed at bedside by stroke coordinator Danielle. Patient passed. Diet resumed. Continue to monitor patients status

## 2022-03-11 NOTE — H&P (Signed)
[facility] HOSPITAL[facility] H&P Note  Patient Info:   Date/Time: 03/11/2022 / 3:29 AM   Admit Date:03/10/2022  Patient Name:Jason Bullock   YQI:[id number]   PCP: Oneita Hurt None, MD  Attending Physician:Kailynne Ferrington, Gerlene Burdock, MD    Assessment/Plan:   Chest pain  Shortness of breath  HFpEF  Paroxysmal atrial fibrillation on Eliquis s/ p ablation (2017 and 2021)  Hypertension  Hyperlipidemia  Hx of AAA  Obesity    This is a 76 year old male who presents with chest pain along with shortness of breath and dyspnea on exertion.  Patient mainly presented due to chest pain.  Dyspnea exertion/shortness of breath has been more subacute/chronic.  Lives in [place] in [place] and seen at the [place] and with cardiologist down at [facility].  Troponins flat/negative, patient pain-free CT scan shows no evidence of AAA and patient with trace edema lower extremities.  Patient has been seen by [facility] cardiology when he visits his daughter here in [name] also.  We will consult [facility] for further evaluation in a.m. Sent secure chat to [facility] for consult in AM.    Other chronic medical conditions appear to be stable.  Continue outpatient medications to include Eliquis.      Patient has BMI=Body mass index is 32.64 kg/m.  Diagnosis: Obesity based on BMI criteria       DVT Prophylaxis: On Eliquis    Central Line/Foley Catheter/PICC line status: None  Code Status: Full Code  Disposition:home  Type of Admission:Observation   Estimated Length of Stay (including stay in the ER receiving treatment): 1 to 2 days  Milestones required for discharge: Improvement/resolution of symptoms and/or clinical stability.    Clinical Information and History:   Chief Complaint:  Chief Complaint   Patient presents with    Abdominal Pain    Shortness of Breath           Past Medical History:  Past Medical History:   Diagnosis Date    Atrial fibrillation      Past Surgical History:  Past Surgical History:    Procedure Laterality Date    ABLATION OF DYSRHYTHMIC FOCUS       Family History:History reviewed. No pertinent family history. Reviewed  Social History:  Social History     Substance and Sexual Activity   Alcohol Use Yes    Comment: couple glasses of wine day, then stops for a few months     Social History     Substance and Sexual Activity   Drug Use Not on file     Social History     Tobacco Use   Smoking Status Never   Smokeless Tobacco Never   Vaping Use   Vaping Status Never Used     Social History     Socioeconomic History    Marital status: Single     Spouse name: None    Number of children: None    Years of education: None    Highest education level: None   Occupational History    None   Tobacco Use    Smoking status: Never    Smokeless tobacco: Never   Vaping Use    Vaping status: Never Used   Substance and Sexual Activity    Alcohol use: Yes     Comment: couple glasses of wine day, then stops for a few months    Drug use: None    Sexual activity: None   Other Topics Concern  None   Social History Narrative    None     Social Determinants of Psychologist, prison and probation services Strain: Not on file   Food Insecurity: Not on file   Transportation Needs: Not on file   Physical Activity: Not on file   Stress: Not on file   Social Connections: Not on file   Intimate Partner Violence: Not on file   Housing Stability: Not on file     Allergies:No Known Allergies  Medications:(Not in a hospital admission)    Clinical Presentation: HPI:   Jason Bullock is a 76 y.o. male who has history of hypertension, hyperlipidemia, heart failure with preserved EF/diastolic dysfunction, paroxysmal atrial fibrillation on Eliquis who presents to the ED with about 2 weeks of not feeling very well with increasing shortness of breath and dyspnea on exertion.  Patient also reported some increased abdominal girth.  This morning patient reported having some left-sided chest discomfort with radiation to his shoulder.  Given symptoms  patient presents to the ED for further evaluation.    In the ED patient was hemodynamically stable.  Patient had no chest pain upon arrival. Laboratory evaluation notable for normal CBC and CMP with glucose of 141.  Lipase was 46, D-dimer was normal at 0.44, troponin was 4.4 and second troponin was 4.1.  Chest x-ray was unremarkable.  EKG shows sinus rhythm first-degree AV block rate of 68 with poor R wave progression.    Patient with history of AAA thusly ED ordered CT angiogram of the chest.  CT of the chest was unremarkable.  Chest x-ray showed no acute cardiopulmonary disease and CT of the head was negative/normal.  Past Surgical History:   Procedure Laterality Date    ABLATION OF DYSRHYTHMIC FOCUS        Past Medical History:   Diagnosis Date    Atrial fibrillation     came with   the chief complaint of Abdominal Pain and Shortness of Breath    Review of Systems:   Chief Complaint:  Abdominal Pain and Shortness of Breath    Review of Systems   Constitutional:  Negative for chills, diaphoresis, fever and malaise/fatigue.   Respiratory:  Positive for shortness of breath. Negative for cough and wheezing.         +DOE   Cardiovascular:  Positive for chest pain.   Gastrointestinal:  Negative for abdominal pain, diarrhea, nausea and vomiting.   Musculoskeletal:  Negative for falls.   Neurological:  Negative for dizziness, sensory change, loss of consciousness and weakness.     Physical Exam:     Vitals:    03/10/22 2300 03/11/22 0030 03/11/22 0200 03/11/22 0230   BP: 123/85   (!) 141/94   Pulse: 63 63 (!) 58 60   Resp:  16     Temp:       TempSrc:       SpO2: 95% 94% 94% 91%   Weight:       Height:         Physical Exam:   Physical Exam  Vitals reviewed.   Constitutional:       General: He is not in acute distress.     Appearance: Normal appearance. He is not ill-appearing.   Cardiovascular:      Rate and Rhythm: Normal rate and regular rhythm.      Pulses: Normal pulses.      Heart sounds: Normal heart sounds.    Pulmonary:  Effort: Pulmonary effort is normal.      Breath sounds: Normal breath sounds.   Abdominal:      General: Abdomen is flat. Bowel sounds are normal.   Musculoskeletal:      Right lower leg: No edema.      Left lower leg: No edema.   Skin:     General: Skin is warm.   Neurological:      General: No focal deficit present.      Mental Status: He is alert. Mental status is at baseline.   Psychiatric:         Mood and Affect: Mood normal.         Thought Content: Thought content normal.       Results of Labs/imaging:   Labs have been reviewed:   Coagulation Profile:       CBC review:   Recent Labs   Lab 03/10/22  2028   WBC 6.51   Hgb 18.0*   Hematocrit 51.7*   Platelets 164   MCV 91.3   RDW 14   Neutrophils 69.4   Neutrophils Absolute 4.52   Lymphocytes Automated 21.2   Eosinophils Automated 0.6   Immature Granulocytes 0.2   Immature Granulocytes Absolute 0.01     Chem Review:  Recent Labs   Lab 03/10/22  2028   Sodium 137   Potassium 4.2   Chloride 101   CO2 25   BUN 16.0   Creatinine 1.2   Glucose 141*   Calcium 9.6   Bilirubin, Total 0.4   AST (SGOT) 16   ALT 17   Alkaline Phosphatase 111     Results       Procedure Component Value Units Date/Time    High Sensitivity Troponin-I at 2 hrs with calculated Delta [[id number]] Collected: 03/10/22 2240    Specimen: Blood Updated: 03/10/22 2351     hs Troponin-I 4.1 ng/L      hs Troponin-I Delta -0.3 ng/L     Urinalysis Reflex to Microscopic Exam- Reflex to Culture [[id number]]  (Abnormal) Collected: 03/10/22 2149    Specimen: Urine, Clean Catch Updated: 03/10/22 2201     Urine Type Urine, Clean Ca     Color, UA Yellow     Clarity, UA Clear     Specific Gravity UA 1.018     Urine pH 6.0     Leukocyte Esterase, UA Negative     Nitrite, UA Negative     Protein, UR Negative     Glucose, UA >=500     Ketones UA Negative     Urobilinogen, UA Normal mg/dL      Bilirubin, UA Negative     Blood, UA Negative    D-Dimer [[id number]] Collected: 03/10/22 2028     Updated:  03/10/22 2201     D-Dimer 0.44 ug/mL FEU     High Sensitivity Troponin-I at 0 hrs [[id number]] Collected: 03/10/22 2028    Specimen: Blood Updated: 03/10/22 2102     hs Troponin-I 4.4 ng/L     Lipase [[id number]] Collected: 03/10/22 2028    Specimen: Blood Updated: 03/10/22 2058     Lipase 46 U/L     GFR [[id number]] Collected: 03/10/22 2028     Updated: 03/10/22 2058     EGFR 58.9       Comprehensive metabolic panel [[id number]]  (Abnormal) Collected: 03/10/22 2028    Specimen: Blood Updated: 03/10/22 2058     Glucose 141 mg/dL      BUN 74.2 mg/dL  Creatinine 1.2 mg/dL      Sodium 161 mEq/L      Potassium 4.2 mEq/L      Chloride 101 mEq/L      CO2 25 mEq/L      Calcium 9.6 mg/dL      Protein, Total 6.9 g/dL      Albumin 4.1 g/dL      AST (SGOT) 16 U/L      ALT 17 U/L      Alkaline Phosphatase 111 U/L      Bilirubin, Total 0.4 mg/dL      Globulin 2.8 g/dL      Albumin/Globulin Ratio 1.5     Anion Gap 11.0    CBC and differential [[id number]]  (Abnormal) Collected: 03/10/22 2028    Specimen: Blood Updated: 03/10/22 2036     WBC 6.51 x10 3/uL      Hgb 18.0 g/dL      Hematocrit 81.1 %      Platelets 164 x10 3/uL      RBC 5.66 x10 6/uL      MCV 91.3 fL      MCH 31.8 pg      MCHC 34.8 g/dL      RDW 14 %      MPV 11.6 fL      Instrument Absolute Neutrophil Count 4.52 x10 3/uL      Neutrophils 69.4 %      Lymphocytes Automated 21.2 %      Monocytes 8.1 %      Eosinophils Automated 0.6 %      Basophils Automated 0.5 %      Immature Granulocytes 0.2 %      Nucleated RBC 0.0 /100 WBC      Neutrophils Absolute 4.52 x10 3/uL      Lymphocytes Absolute Automated 1.38 x10 3/uL      Monocytes Absolute Automated 0.53 x10 3/uL      Eosinophils Absolute Automated 0.04 x10 3/uL      Basophils Absolute Automated 0.03 x10 3/uL      Immature Granulocytes Absolute 0.01 x10 3/uL      Absolute NRBC 0.00 x10 3/uL           Radiology reports have been reviewed:  Radiology Results (24 Hour)       Procedure Component Value Units Date/Time    CT  Angio AAA Chest/ Abdomen [[id number]] Collected: 03/11/22 0113    Order Status: Completed Updated: 03/11/22 0122    Narrative:      HISTORY: Aortic disease.        COMPARISON: None available.    TECHNIQUE: CTA chest and abdomen through the level of the aortic bifurcation WITH intravenous contrast. 100 mL IV Omnipaque 350 was administered. Oral contrast was not administered. 3D MIP images are submitted and reviewed. The following dose reduction   techniques were utilized: Automated exposure control and/or adjustment of the mA and/or kV according to patient size, and the use of iterative reconstruction technique.    FINDINGS:     AORTA: Atherosclerotic disease of the intrathoracic aorta with aneurysmal dilatation of the ascending thoracic aorta measuring 4.7 x 4.7 cm. No evidence of intrathoracic aortic dissection. Scattered atheromatous calcified plaque of the intra-abdominal   aorta with aneurysmal dilatation of infrarenal abdominal aorta measuring 3.4 x 3.4 cm. No evidence of intra-abdominal aortic dissection. The celiac axis, SMA, and bilateral renal arteries are patent. IMA demonstrates faint opacification.     LINES/TUBES: None.    CHEST:  LUNGS: No  consolidation. Mild bibasilar atelectasis.  PLEURA: No pleural effusions or pneumothorax.    HEART: Not enlarged.  MEDIASTINUM: No axillary, hilar or mediastinal lymphadenopathy.  PULMONARY ARTERIES:          ABDOMEN:  LIVER/BILIARY TREE: No mass or intrahepatic biliary dilatation. No gallbladder distension or calcified gallstones.  SPLEEN: No splenomegaly.  PANCREAS: No pancreatic mass or duct dilatation. Atrophic.    KIDNEYS/URETERS: No hydronephrosis, stones or solid mass lesions.  ADRENALS: No adrenal mass.    PERITONEUM/RETROPERITONEUM: No free air or fluid.  LYMPH NODES: No lymphadenopathy.    GI TRACT: No bowel obstruction. Appendix appears within normal limits.    BONES AND SOFT TISSUES: Degenerative changes of the visualized spine. Remote appearing left  lateral ninth rib fracture.      Impression:          1. 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    Judd Gaudier, MD  03/11/2022 1:20 AM    CT Head without Contrast [[id number]] Collected: 03/11/22 0111    Order Status: Completed Updated: 03/11/22 0114    Narrative:      HISTORY: Dizziness.    COMPARISON: None available.    TECHNIQUE: CT of the head performed without intravenous contrast. The following dose reduction techniques were utilized: automated exposure control and/or adjustment of the mA and/or KV according to patient size, and the use of an iterative   reconstruction technique.    FINDINGS: Mild cerebral and cerebellar parenchymal volume loss. Mild supratentorial leukomalacia which is nonspecific however most compatible with chronic microvascular ischemic changes. Intracranial atherosclerotic calcifications are noted.    No acute territorial infarct, intracranial hemorrhage, mass effect, midline shift, tonsillar herniation or extra-axial fluid collections.     Ventricles and basilar cisterns are intact.    Visualized paranasal sinuses demonstrate no significant opacification. No calvarial fracture. Scattered scalp soft tissue calcifications are noted.      Impression:        No acute intracranial abnormality.    Judd Gaudier, MD  03/11/2022 1:12 AM    Chest AP Portable [[id number]] Collected: 03/10/22 2241    Order Status: Completed Updated: 03/10/22 2245    Narrative:      HISTORY: Left-sided chest pain, shortness of breath     COMPARISON: None    FINDINGS:   The cardiomediastinal silhouette is normal. No effusions or focal infiltrates are identified. The pulmonary vascular pattern is normal.      Impression:        No acute cardiopulmonary disease.    Collene Schlichter, MD  03/10/2022 10:43 PM          CT Angio AAA Chest/ Abdomen    Result Date: 03/11/2022  HISTORY: Aortic disease.    COMPARISON: None available.  TECHNIQUE: CTA chest and abdomen through the level of the aortic bifurcation WITH intravenous contrast. 100 mL IV Omnipaque 350 was administered. Oral contrast was not administered. 3D MIP images are submitted and reviewed. The following dose reduction techniques were utilized: Automated exposure control and/or adjustment of the mA and/or kV according to patient size, and the use of iterative reconstruction technique. FINDINGS: AORTA: Atherosclerotic disease of the intrathoracic aorta with aneurysmal dilatation of the ascending thoracic aorta measuring 4.7 x 4.7 cm. No evidence of intrathoracic aortic dissection. Scattered atheromatous calcified plaque of the intra-abdominal aorta with aneurysmal dilatation of infrarenal abdominal aorta measuring 3.4 x 3.4 cm. No evidence of intra-abdominal aortic dissection. The celiac axis,  SMA, and bilateral renal arteries are patent. IMA demonstrates faint opacification. LINES/TUBES: None. CHEST: LUNGS: No consolidation. Mild bibasilar atelectasis. PLEURA: No pleural effusions or pneumothorax. HEART: Not enlarged. MEDIASTINUM: No axillary, hilar or mediastinal lymphadenopathy. PULMONARY ARTERIES:    ABDOMEN: LIVER/BILIARY TREE: No mass or intrahepatic biliary dilatation. No gallbladder distension or calcified gallstones. SPLEEN: No splenomegaly. PANCREAS: No pancreatic mass or duct dilatation. Atrophic. KIDNEYS/URETERS: No hydronephrosis, stones or solid mass lesions. ADRENALS: No adrenal mass. PERITONEUM/RETROPERITONEUM: No free air or fluid. LYMPH NODES: No lymphadenopathy. GI TRACT: No bowel obstruction. Appendix appears within normal limits. BONES AND SOFT TISSUES: Degenerative changes of the visualized spine. Remote appearing left lateral ninth rib fracture.     1. 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 Judd Gaudier, MD 03/11/2022 1:20 AM    CT Head without Contrast    Result  Date: 03/11/2022  HISTORY: Dizziness. COMPARISON: None available. TECHNIQUE: CT of the head performed without intravenous contrast. The following dose reduction techniques were utilized: automated exposure control and/or adjustment of the mA and/or KV according to patient size, and the use of an iterative reconstruction technique. FINDINGS: Mild cerebral and cerebellar parenchymal volume loss. Mild supratentorial leukomalacia which is nonspecific however most compatible with chronic microvascular ischemic changes. Intracranial atherosclerotic calcifications are noted. No acute territorial infarct, intracranial hemorrhage, mass effect, midline shift, tonsillar herniation or extra-axial fluid collections. Ventricles and basilar cisterns are intact. Visualized paranasal sinuses demonstrate no significant opacification. No calvarial fracture. Scattered scalp soft tissue calcifications are noted.     No acute intracranial abnormality. Judd Gaudier, MD 03/11/2022 1:12 AM    Chest AP Portable    Result Date: 03/10/2022  HISTORY: Left-sided chest pain, shortness of breath COMPARISON: None FINDINGS: The cardiomediastinal silhouette is normal. No effusions or focal infiltrates are identified. The pulmonary vascular pattern is normal.     No acute cardiopulmonary disease. Collene Schlichter, MD 03/10/2022 10:43 PM    Pathology:   Specimens (From admission, onward)      None          EKG: EKG reviewed   Last EKG Result       Procedure Component Value Units Date/Time    ECG 12 lead [[id number]] Collected: 03/10/22 2010     Updated: 03/10/22 2022     Ventricular Rate 68 BPM      Atrial Rate 68 BPM      P-R Interval 240 ms      QRS Duration 72 ms      Q-T Interval 390 ms      QTC Calculation (Bezet) 414 ms      P Axis 55 degrees      R Axis 12 degrees      T Axis -12 degrees      [facility] MUSE NARRATIVE AND IMPRESSION --     SINUS RHYTHM WITH 1ST DEGREE A-V BLOCK  ANTEROSEPTAL MYOCARDIAL INFARCTION , AGE UNDETERMINED  ABNORMAL ECG  NO PREVIOUS  ECGS AVAILABLE      Narrative:      SINUS RHYTHM WITH 1ST DEGREE A-V BLOCK  ANTEROSEPTAL MYOCARDIAL INFARCTION , AGE UNDETERMINED  ABNORMAL ECG  NO PREVIOUS ECGS AVAILABLE            Hospitalist:   Signed by:   Rayburn Ma, MD  03/11/2022 3:29 AM  Time spent in evaluating and managing the care of the patient including face-to-face and non-face-to-face: 55 minutes    *This note was  generated by the Epic EMR system/ Dragon speech recognition and may contain inherent errors or omissions not intended by the user. Grammatical errors, random word insertions, deletions, pronoun errors and incomplete sentences are occasional consequences of this technology due to software limitations. Not all errors are caught or corrected. If there are questions or concerns about the content of this note or information contained within the body of this dictation they should be addressed directly with the author for clarification.

## 2022-03-11 NOTE — Plan of Care (Signed)
Problem: Chest Pain  Goal: Vital signs and cardiac rhythm stable  Flowsheets (Taken 03/11/2022 0918)  Vital signs and cardiac rhythm stable:   Monitor /assess vital signs/cardiac rhythms   Monitor labs  Goal: Cardiac pain management  Flowsheets (Taken 03/11/2022 [phone])  Cardiac pain management:   Assess/report chest pain/or related discomfort to LIP immediately   Instruct patient to report any change in pain status   Assess pain/or related discomfort on admission, during daily assessment, before and after any intervention  Goal: Anxiety management/effective coping  Flowsheets (Taken 03/11/2022 0918)  Anxiety management/effective coping: Encourage patient to immediately report any increase in anxiety and/or depression  Goal: Patient/Patient Care Companion demonstrates understanding of disease process, treatment plan, medications, and discharge plan  Flowsheets (Taken 03/11/2022 0918)  Patient/Patient Care Companion demonstrates understanding of disease process, treatment plan, medications and discharge plan: Educate patient to immediately report any chest pain/equivalent to RN

## 2022-03-11 NOTE — Progress Notes (Signed)
[facility] added to AVS.

## 2022-03-11 NOTE — Progress Notes (Signed)
03/11/22 1046   NIH Stroke Scale   Interval Change in Status   Level of Consciousness (1a. ) 0   Question - age, month 0   Commands; Open and close eyes; Grip and release good hand 0   Gaze 0   Visual Fields 0   Facial Palsy 0   Motor Left Arm:  Arms (palm down) x 10 seconds sitting = 90 degrees supine = 45 degress 0   Motor Right Arm:  Arms (palm down) x 10 seconds sitting = 90 degrees supine = 45 degress 0   Total Motor Arms 0   Motor Left Leg x 5 seconds supine = 30 degrees 0   Motor Right  Leg x 5 seconds supine = 30 degrees 0   Total Motor Legs 0   Limb Ataxia finger to nose 0   Sensory to pin prick 0   Best language describe picture, name items 0   Dysarthria reads words from list 1   Extinction and Inattention 0   NIHSS Total 1

## 2022-03-11 NOTE — Progress Notes (Signed)
[facility] HOSPITALIST Progress Note  Patient Info:   Date/Time: 03/11/2022 / 4:14 PM   Admit Date:03/10/2022  Patient Name:Jason Bullock   ZOX:[id number]   PCP: Pcp, None, Gabryel Files  Attending Physician:Rober Skeels Ashfiqur, Lala Been  Assessment/Plan:     [name] is a 76 y.o. male w PMH of  of hypertension, hyperlipidemia,chronic diastolic  heart failure , paroxysmal atrial fibrillation on Eliquis was admitted via  the ED with about 2 weeks of not feeling very well with increasing shortness of breath and dyspnea on exertion.    Patient also reported some increased abdominal girth.    In the ED patient was hemodynamically stable.  Patient had no chest pain upon arrival. Laboratory evaluation notable for normal CBC and CMP with glucose of 141.  Lipase was 46, D-dimer was normal at 0.44,   troponin x 2 neg    Chest x-ray was unremarkable.  EKG shows sinus rhythm first-degree AV block rate of 68 with poor R wave progression.  Patient with history of AAA thusly ED ordered CT angiogram of the chest.  CT of the chest was unremarkable.  Chest x-ray showed no acute cardiopulmonary disease and CT of the head was negative/normal.  Cardiology has evaluated the patient and recommended to continue home medications and outpatient follow-up with primary cardiologist for echo and stress test as outpatient.  Code stroke was called earlier this morning; CT head, CTA head neck, MRI brain negative for any acute stroke.  Neurology consulted: Concerns for seizure.  EEG  was recommended to be done w [facility] but pt wants it ti be done here ; ordered .         S/p code stroke 4.26.23  No focal deficit   As per neuro   Transient confusion with left hand shaking - the event not consistent with TIA but more concern for partial seizure being that there is loss of memory of some recent event.     Patient has had multiple events with similar features where there is shaking of his hand that last less than a minute with resolution.    - He will get EEG  and see neurologist at [facility].    The neurologist  has left a message for his [place] nurse about  recommendation.   But the RN called back asking for it to be done here ; so does the pt   EEG ordered   Stroke protocol can be [place]'ed per neuro team   No need for echo here as MRI neg   Cont current mx   [place] ASA cont eliquis   Check LIPID panel and A1 c .         Exertional dyspnea/atypical chest pain.  Acute MI ruled out.  Troponin is negative.  EKG as mentioned above.  Cardiology evaluation completed and recommended to continue home medications and outpatient follow-up with primary cardiologist for outpatient echocardiogram and stress test.        Paroxysmal atrial fibrillation  CHADS2-Vasc 3 (HTN, age x2) on apixaban  S/p ablation 2015, 2017, 2021  Continue home medication Cardizem and Eliquis.        Chronic HFpEF  BNP within normal range.  Euvolemic at this point  Continue Aldactone.  Lasix.  Jardiance.          Hypertension  Stable   Cont cardizem      Hyperlipidemia  Not on any meds   Op follow up w PCP and check lipid panel as OP  Hx of Thoracic aortic aneurysm  CTA April 2023: Ascending Ao 4.7 cm  Echo 2021: Ascending Ao 4.8 cm, infrarenal abd Ao 3.4 cm.  Echo 2021: EF 60%, mildly dilated RV with normal function, trace AR, neg bubble study, TAA as above  Op follow up w cardiologist.        Patient has BMI=Body mass index is 32.64 kg/m.  Diagnosis: Obesity based on BMI criteria       DVT Prophylaxis:Medication VTE Prophylaxis Orders: apixaban (ELIQUIS) tablet 5 mg,   Mechanical VTE Prophylaxis Orders: Maintain sequential compression device   Central Line/Foley Catheter/PICC line status: None  Code Status: Full Code  Disposition:Planning to discharge patient to home once stable   Stroke work up neg   Per Dr Alvino Chapel possible seizure ; EEG pending ; pt wants to get it done while here : earliest EEG can be done is tomorrow .  Dr Payton Mccallum sehati will be covering tomorrow .  Type of Admission:Observation    [place], [place]      [phone] Daughter   Updated the daughter.        Hospital Problems:     Active Hospital Problems    Diagnosis    Chest pain     Subjective:     Code stroke was called today am at around 10:45 AM   with CC of   Dizziness .  Lost control of LEFT arm slurred speech and transient  Memory impairment.        Chief Complaint:on admission   Abdominal Pain and Shortness of Breath      Objective:     Vitals:    03/11/22 1402 03/11/22 1403 03/11/22 1405 03/11/22 1613   BP: 152/82 153/84 (!) 152/94 (!) 155/92   Pulse: 67 70 71 69   Resp:    18   Temp:    98.4 F (36.9 C)   TempSrc:    Oral   SpO2:    96%   Weight:       Height:         Physical Exam:   Physical Exam  Constitutional:       Appearance: He is obese.   HENT:      Head: Normocephalic and atraumatic.      Mouth/Throat:      Mouth: Mucous membranes are dry.   Eyes:      Extraocular Movements: Extraocular movements intact.      Pupils: Pupils are equal, round, and reactive to light.   Cardiovascular:      Rate and Rhythm: Normal rate and regular rhythm.   Musculoskeletal:         General: Normal range of motion.      Cervical back: Normal range of motion and neck supple.      Right lower leg: No edema.      Left lower leg: No edema.   Skin:     General: Skin is warm and dry.      Capillary Refill: Capillary refill takes less than 2 seconds.   Neurological:      General: No focal deficit present.      Mental Status: He is alert and oriented to person, place, and time.      Cranial Nerves: No cranial nerve deficit.      Sensory: No sensory deficit.      Motor: No weakness.      Coordination: Coordination normal.   Psychiatric:         Mood and  Affect: Mood normal.         Behavior: Behavior normal.         Thought Content: Thought content normal.                     Results of Labs/imaging   All the results from labs and images within the last 24 hours are reviewed by me.  Hospitalist   Signed by:   Anuradha Chabot Otho Ket, Shalom Mcguiness  03/11/2022 4:14 PM      *This note was  generated by the Epic EMR system/ Dragon speech recognition and may contain inherent errors or omissions not intended by the user. Grammatical errors, random word insertions, deletions, pronoun errors and incomplete sentences are occasional consequences of this technology due to software limitations. Not all errors are caught or corrected. If there are questions or concerns about the content of this note or information contained within the body of this dictation they should be addressed directly with the author for clarification

## 2022-03-11 NOTE — ED Notes (Signed)
[facility]  ED NURSING NOTE FOR THE RECEIVING INPATIENT NURSE   ED NURSE Maceo [phone]   ED CHARGE RN [phone]   ADMISSION INFORMATION   Dandrae Kustra is a 76 y.o. male admitted with an ED diagnosis of:    No diagnosis found.     Isolation: None   Allergies: Patient has no known allergies.   Holding Orders confirmed? Yes   Belongings Documented? Yes   Home medications sent to pharmacy confirmed? Yes   NURSING CARE   Patient Comes From:   Mental Status: Home Independent  alert and oriented   ADL: Independent with all ADLs   Ambulation: no difficulty   Pertinent Information  and Safety Concerns:     [place] Violence Risk Level: Low HOH     CT / NIH   CT Head ordered on this patient?  No   NIH/Dysphagia assessment done prior to admission? No   VITAL SIGNS (at the time of this note)      Vitals:    03/11/22 0030   BP:    Pulse: 63   Resp: 16   Temp:    SpO2: 94%

## 2022-03-11 NOTE — Plan of Care (Signed)
Problem: Chest Pain  Goal: Vital signs and cardiac rhythm stable  Flowsheets (Taken 03/11/2022 0918)  Vital signs and cardiac rhythm stable:   Monitor /assess vital signs/cardiac rhythms   Monitor labs  Goal: Cardiac pain management  Flowsheets (Taken 03/11/2022 0918)  Cardiac pain management:   Assess/report chest pain/or related discomfort to LIP immediately   Instruct patient to report any change in pain status   Assess pain/or related discomfort on admission, during daily assessment, before and after any intervention  Goal: Anxiety management/effective coping  Flowsheets (Taken 03/11/2022 0918)  Anxiety management/effective coping: Encourage patient to immediately report any increase in anxiety and/or depression  Goal: Patient/Patient Care Companion demonstrates understanding of disease process, treatment plan, medications, and discharge plan  Flowsheets (Taken 03/11/2022 0918)  Patient/Patient Care Companion demonstrates understanding of disease process, treatment plan, medications and discharge plan: Educate patient to immediately report any chest pain/equivalent to RN

## 2022-03-12 DIAGNOSIS — R4689 Other symptoms and signs involving appearance and behavior: Secondary | ICD-10-CM

## 2022-03-12 LAB — LIPID PANEL
Cholesterol / HDL Ratio: 3.5 Index
Cholesterol: 145 mg/dL (ref 0–199)
HDL: 41 mg/dL (ref 40–9999)
LDL Calculated: 77 mg/dL (ref 0–99)
Triglycerides: 135 mg/dL (ref 34–149)
VLDL Calculated: 27 mg/dL (ref 10–40)

## 2022-03-12 LAB — CBC AND DIFFERENTIAL
Absolute NRBC: 0 10*3/uL (ref 0.00–0.00)
Basophils Absolute Automated: 0.02 10*3/uL (ref 0.00–0.08)
Basophils Automated: 0.3 %
Eosinophils Absolute Automated: 0.11 10*3/uL (ref 0.00–0.44)
Eosinophils Automated: 1.9 %
Hematocrit: 48 % (ref 37.6–49.6)
Hgb: 16.5 g/dL (ref 12.5–17.1)
Immature Granulocytes Absolute: 0.02 10*3/uL (ref 0.00–0.07)
Immature Granulocytes: 0.3 %
Instrument Absolute Neutrophil Count: 3.81 10*3/uL (ref 1.10–6.33)
Lymphocytes Absolute Automated: 1.23 10*3/uL (ref 0.42–3.22)
Lymphocytes Automated: 21.4 %
MCH: 31.7 pg (ref 25.1–33.5)
MCHC: 34.4 g/dL (ref 31.5–35.8)
MCV: 92.1 fL (ref 78.0–96.0)
MPV: 11.7 fL (ref 8.9–12.5)
Monocytes Absolute Automated: 0.57 10*3/uL (ref 0.21–0.85)
Monocytes: 9.9 %
Neutrophils Absolute: 3.81 10*3/uL (ref 1.10–6.33)
Neutrophils: 66.2 %
Nucleated RBC: 0 /100 WBC (ref 0.0–0.0)
Platelets: 129 10*3/uL — ABNORMAL LOW (ref 142–346)
RBC: 5.21 10*6/uL (ref 4.20–5.90)
RDW: 14 % (ref 11–15)
WBC: 5.76 10*3/uL (ref 3.10–9.50)

## 2022-03-12 LAB — APTT: PTT: 37 s (ref 27–39)

## 2022-03-12 LAB — ECG 12-LEAD
Atrial Rate: 71 {beats}/min
P Axis: 49 degrees
P-R Interval: 250 ms
Q-T Interval: 396 ms
QRS Duration: 72 ms
QTC Calculation (Bezet): 430 ms
R Axis: 3 degrees
T Axis: -31 degrees
Ventricular Rate: 71 {beats}/min

## 2022-03-12 LAB — COMPREHENSIVE METABOLIC PANEL
ALT: 14 U/L (ref 0–55)
AST (SGOT): 14 U/L (ref 5–41)
Albumin/Globulin Ratio: 1.7 (ref 0.9–2.2)
Albumin: 3.6 g/dL (ref 3.5–5.0)
Alkaline Phosphatase: 88 U/L (ref 37–117)
Anion Gap: 9 (ref 5.0–15.0)
BUN: 16 mg/dL (ref 9.0–28.0)
Bilirubin, Total: 0.7 mg/dL (ref 0.2–1.2)
CO2: 24 mEq/L (ref 17–29)
Calcium: 8.5 mg/dL (ref 7.9–10.2)
Chloride: 105 mEq/L (ref 99–111)
Creatinine: 1 mg/dL (ref 0.5–1.5)
Globulin: 2.1 g/dL (ref 2.0–3.6)
Glucose: 95 mg/dL (ref 70–100)
Potassium: 4 mEq/L (ref 3.5–5.3)
Protein, Total: 5.7 g/dL — ABNORMAL LOW (ref 6.0–8.3)
Sodium: 138 mEq/L (ref 135–145)

## 2022-03-12 LAB — T4, FREE: T4 Free: 1.02 ng/dL (ref 0.69–1.48)

## 2022-03-12 LAB — RENAL FUNCTION PANEL: Phosphorus: 3.8 mg/dL (ref 2.3–4.7)

## 2022-03-12 LAB — TSH: TSH: 1.32 u[IU]/mL (ref 0.35–4.94)

## 2022-03-12 LAB — GFR: EGFR: >60

## 2022-03-12 LAB — T3, FREE: T3, Free: 2.55 pg/mL (ref 1.71–3.71)

## 2022-03-12 LAB — MAGNESIUM: Magnesium: 2.3 mg/dL (ref 1.6–2.6)

## 2022-03-12 LAB — PT/INR
PT INR: 1.2 — ABNORMAL HIGH (ref 0.9–1.1)
PT: 13.4 s — ABNORMAL HIGH (ref 10.1–12.9)

## 2022-03-12 NOTE — Discharge Summary (Addendum)
[facility] HOSPITALIST Discharge Summary  Patient Info:   Date/Time: 03/12/2022 / 6:56 PM   Admit Date:03/10/2022  Patient Name:Jason Bullock   ZOX:[id number]   PCP: Marisa Sprinkles, MD  Attending Physician:Carlina Derks, Betsey Holiday, MD    Hospital Course:   Please see H&P for complete details of HPI and ROS. The patient was admitted   to [facility] and has been taken care as mentioned below.    Jason Bullock is a 76 year old male with past medical history  of hypertension, hyperlipidemia,chronic diastolic  heart failure , paroxysmal atrial fibrillation on Eliquis was admitted via  the ED with about 2 weeks of not feeling very well with increasing shortness of breath and dyspnea on exertion.    Patient also reported some increased abdominal girth.    In the ED patient was hemodynamically stable.  Patient had no chest pain upon arrival. Laboratory evaluation notable for normal CBC and CMP with glucose of 141.  Lipase was 46, D-dimer was normal at 0.44,   troponin x 2 negative    Chest x-ray was unremarkable.  EKG shows sinus rhythm first-degree AV block rate of 68 with poor R wave progression.  Patient with history of AAA and  ED ordered CT angiogram of the chest  was unremarkable.  Chest x-ray showed no acute cardiopulmonary disease and CT of the head was negative/normal.  Cardiology has evaluated the patient and recommended to continue home medications and outpatient follow-up with primary cardiologist for echo and stress test as outpatient.  Code stroke was called earlier on 03/11/2022 morning given patient had episode of confusion and some arm movement; CT head, CTA head neck, MRI brain negative for any acute stroke.  Neurology consulted: Concerns for seizure.  EEG  was recommended to be done and no epileptiform reflex detected patient was cleared from neurology for discharge     1.  S/p code stroke 4.26.23 episode of confusion and left hand shaking  No focal deficit   As per neurology transient confusion with left hand  shaking - the event not consistent with TIA but more concern for partial seizure being that there is loss of memory of some recent event.     Patient has had multiple events similar to this in the past last less than a minute with resolution.    EEG and see neurologist at [facility].    Stroke protoc discontinuedol  per neuro team   No need for echo here as MRI  brain negative for stroke  Continue eliquis discontinue aspirin   LIPID panel showed total cholesterol of 145, HDL 41, LDL of 77 and triglycerides of 135 and A1 c 5.6  It is recommended to take video clips if he have further and shaking recommended by neurology no indication to start antiepileptics medications.   Patient is recommended to see a neurologist outpatient and recommended not driving vehicle and stay on seizure precaution in the meantime and look for further episodes until cleared by his neurologist.    2. Exertional dyspnea/atypical chest pain.Acute MI ruled out by cardiology  Troponin is negative.  EKG as mentioned above.  Cardiology evaluation completed and recommended to continue home medications and outpatient follow-up with primary cardiologist for outpatient echocardiogram and stress test.     3.  Paroxysmal atrial fibrillation  CHADS2-Vasc 3 (HTN, age x2) on apixaban  S/p ablation 2015, 2017, 2021  Continue home medication Cardizem and Eliquis.      4.  Chronic HFpEF  BNP within normal range.  Euvolemic at this point  Continue Aldactone, Lasix and Jardiance.     5. Hypertension. Stable   Continue  cardizem      6. Hyperlipidemia lipid panel as mentioned above.  Follow-up with primary care physician    7.  History of Thoracic aortic aneurysm  CTA April 2023: Ascending Ao 4.7 cm  Echo 2021: Ascending Ao 4.8 cm, infrarenal abd Ao 3.4 cm.  Echo 2021: EF 60%, mildly dilated RV with normal function, trace AR, neg bubble study, TAA as above  Outpatient follow up with cardiologist.     8. Patient has BMI=Body mass index is 32.64 kg/m.  Diagnosis:  Obesity based on BMI criteria  Diet and lifestyle modification for weight loss         Disposition: Vital signs stable, neurologically stable no arm shaking at this time.  Patient is discharged home.  Finding and plan discussed with patient.           Disposition:home  Condition at Discharge and Prognosis: Stable, prognosis is good  Admission Date:03/10/2022  Discharge Date: 03/12/22  Type of Admission:Observation   Code Status: Full Code  Subjective:     Chief Complaint:  Abdominal Pain and Shortness of Breath    Denies any headache dizziness chest pain shortness of breath arm shaking weakness  Objective:     Vitals:    03/12/22 1040 03/12/22 1159 03/12/22 1300 03/12/22 1612   BP: 136/79 131/80  (!) 155/91   Pulse: 75 76 83 81   Resp:  18  17   Temp:  98.2 F (36.8 C)  97.9 F (36.6 C)   TempSrc:  Oral  Oral   SpO2:  94%  97%   Weight:       Height:         Physical Exam:   Constitutional:       Appearance: He is obese.   HENT:      Head: Normocephalic and atraumatic.      Mouth/Throat:      Mouth: Mucous membranes are moist oral mucosa  Eyes:      Extraocular Movements: Extraocular movements intact.      Pupils: Pupils are equal, round, and reactive to light.   Cardiovascular:      Rate and Rhythm: Normal rate and regular rhythm.   Musculoskeletal:         General: Normal range of motion.      Cervical back: Normal range of motion and neck supple.      Right lower leg: No edema.      Left lower leg: No edema.   Skin:     General: Skin is warm and dry.      Capillary Refill: Capillary refill takes less than 2 seconds.   Neurological:      General: No focal deficit present.      Mental Status: He is alert and oriented to person, place, and time.      Cranial Nerves: No cranial nerve deficit.      Sensory: No sensory deficit.      Motor: No weakness.      Coordination: Coordination normal.   Psychiatric:         Mood and Affect: Mood normal.         Behavior: Behavior normal.         Thought Content: Thought content  normal.      Consults Dr. Jannifer Rodney, neurology    Clinical Presentation:  History of Presenting Illness: Please refer to HPI in the Detailed H&P    Discharge Medications:   Discharge Medications:     Medication List        CONTINUE taking these medications      acetaminophen 325 MG tablet  Commonly known as: TYLENOL     carboxymethylcellulose (PF) 0.5 % ophthalmic solution  Commonly known as: REFRESH PLUS     CENTRUM SILVER 50+MEN PO     dilTIAZem 120 MG 24 hr capsule  Commonly known as: TIAZAC     Eliquis 5 MG  Generic drug: apixaban     empagliflozin 25 MG tablet  Commonly known as: JARDIANCE     furosemide 40 MG tablet  Commonly known as: LASIX     loratadine 10 MG tablet  Commonly known as: CLARITIN     spironolactone 25 MG tablet  Commonly known as: ALDACTONE                    Follow up recommendations:   Follow up:    Follow-up Information       Dr Sydnee Levans. Schedule an appointment as soon as possible for a visit in 3 day(s).    Contact information:  PCP             Dr Tawnya Crook Follow up in 3 day(s).    Why: for Out pt Echocardiogram and stress test  Contact information:  cardiologist             Laurell Josephs, Hyacinth Meeker, MD Follow up.    Specialties: Neurology, Neuromuscular Medicine, Brain Injury Medicine, Vascular Neurology, Clinical Neurophysiology  Contact information:  [address]  [place] [address]  [phone]                              Results of Labs/imaging:   Labs have been reviewed:   Coagulation Profile:   Recent Labs   Lab 03/12/22  0419   PT 13.4*   PT INR 1.2*   PTT 37       CBC review:   Recent Labs   Lab 03/12/22  0419 03/11/22  1023 03/11/22  0440 03/10/22  2028   WBC 5.76 5.00 5.15 6.51   Hgb 16.5 17.0 17.0 18.0*   Hematocrit 48.0 49.9* 50.6* 51.7*   Platelets 129* 140* 148 164   MCV 92.1 92.6 93.4 91.3   RDW 14 14 14 14    Neutrophils 66.2 68.2 62.1 69.4   Neutrophils Absolute 3.81 3.41 3.20 4.52   Lymphocytes Automated 21.4 21.6 25.6 21.2   Eosinophils Automated 1.9 1.2  1.2 0.6   Immature Granulocytes 0.3 0.2 0.6 0.2   Immature Granulocytes Absolute 0.02 0.01 0.03 0.01     Chem Review:  Recent Labs   Lab 03/12/22  0419 03/11/22  1023 03/11/22  0440 03/10/22  2028   Sodium 138 138 138 137   Potassium 4.0 4.1 4.0 4.2   Chloride 105 104 101 101   CO2 24 26 27 25    BUN 16.0 15.0 18.0 16.0   Creatinine 1.0 1.1 1.3 1.2   Glucose 95 113* 108* 141*   Calcium 8.5 9.2 9.3 9.6   Magnesium 2.3  --   --   --    Phosphorus 3.8  --   --   --    Bilirubin, Total 0.7 0.5  --  0.4   AST (SGOT)  14 13  --  16   ALT 14 13  --  17   Alkaline Phosphatase 88 99  --  111     Results       Procedure Component Value Units Date/Time    T3, free [[id number]] Collected: 03/12/22 0419    Specimen: Blood Updated: 03/12/22 0755     T3, Free 2.55 pg/mL     Narrative:      If not done in the ED    T4, free [[id number]] Collected: 03/12/22 0419    Specimen: Blood Updated: 03/12/22 0755     T4 Free 1.02 ng/dL     Narrative:      If not done in the ED    Lipid panel [[id number]] Collected: 03/12/22 0419    Specimen: Blood Updated: 03/12/22 0745     Cholesterol 145 mg/dL      Triglycerides 086 mg/dL      HDL 41 mg/dL      LDL Calculated 77 mg/dL      VLDL Calculated 27 mg/dL      Cholesterol / HDL Ratio 3.5 Index     Narrative:      If not done in the ED    TSH [[id number]] Collected: 03/12/22 0419    Specimen: Blood Updated: 03/12/22 0529     TSH 1.32 uIU/mL     Narrative:      If not done in the ED    Renal function panel [[id number]] Collected: 03/12/22 0419    Specimen: Blood Updated: 03/12/22 0514     Phosphorus 3.8 mg/dL     Narrative:      If not done in the ED    GFR [[id number]] Collected: 03/12/22 0419     Updated: 03/12/22 0514     EGFR >60.0       Narrative:      If not done in the ED    Comprehensive metabolic panel [[id number]]  (Abnormal) Collected: 03/12/22 0419    Specimen: Blood Updated: 03/12/22 0514     Glucose 95 mg/dL      BUN 59.5 mg/dL      Creatinine 1.0 mg/dL      Sodium 638 mEq/L      Potassium 4.0  mEq/L      Chloride 105 mEq/L      CO2 24 mEq/L      Calcium 8.5 mg/dL      Protein, Total 5.7 g/dL      Albumin 3.6 g/dL      AST (SGOT) 14 U/L      ALT 14 U/L      Alkaline Phosphatase 88 U/L      Bilirubin, Total 0.7 mg/dL      Globulin 2.1 g/dL      Albumin/Globulin Ratio 1.7     Anion Gap 9.0    Narrative:      If not done in the ED    APTT [[id number]] Collected: 03/12/22 0419     Updated: 03/12/22 0504     PTT 37 sec     Narrative:      If not done in the ED    Prothrombin time/INR [[id number]]  (Abnormal) Collected: 03/12/22 0419    Specimen: Blood Updated: 03/12/22 0504     PT 13.4 sec      PT INR 1.2    Narrative:      If not done in the ED    Magnesium [[id number]] Collected: 03/12/22 0419    Specimen:  Blood Updated: 03/12/22 0503     Magnesium 2.3 mg/dL     CBC and differential [[id number]][[id number]]  (Abnormal) Collected: 03/12/22 0419    Specimen: Blood Updated: 03/12/22 0455     WBC 5.76 x10 3/uL      Hgb 16.5 g/dL      Hematocrit 47.848.0 %      Platelets 129 x10 3/uL      RBC 5.21 x10 6/uL      MCV 92.1 fL      MCH 31.7 pg      MCHC 34.4 g/dL      RDW 14 %      MPV 11.7 fL      Instrument Absolute Neutrophil Count 3.81 x10 3/uL      Neutrophils 66.2 %      Lymphocytes Automated 21.4 %      Monocytes 9.9 %      Eosinophils Automated 1.9 %      Basophils Automated 0.3 %      Immature Granulocytes 0.3 %      Nucleated RBC 0.0 /100 WBC      Neutrophils Absolute 3.81 x10 3/uL      Lymphocytes Absolute Automated 1.23 x10 3/uL      Monocytes Absolute Automated 0.57 x10 3/uL      Eosinophils Absolute Automated 0.11 x10 3/uL      Basophils Absolute Automated 0.02 x10 3/uL      Immature Granulocytes Absolute 0.02 x10 3/uL      Absolute NRBC 0.00 x10 3/uL     Narrative:      If not done in the ED          Radiology reports have been reviewed:  Radiology Results (24 Hour)       ** No results found for the last 24 hours. **          MRI brain without contrast    Result Date: 03/11/2022  HISTORY: Left upper extremity numbness.  COMPARISON: Correlation with CT and CTA from earlier same date. TECHNIQUE: Non-contrast MR images of the brain were obtained. FINDINGS: There is no evidence of acute infarction. There is no intracranial mass or mass effect. There is no intracranial hemorrhage or extra-axial collection. There is no hydrocephalus. There are mild T2 hyperintense changes within the periventricular and subcortical white matter, most likely related to chronic microangiopathic ischemia. There is no destructive osseous lesion. There is mild mucosal thickening within the paranasal sinuses. Trace right mastoid effusion.      No evidence of acute intracranial abnormality. Susy Frizzleaniel Fistere, MD 03/11/2022 3:53 PM    CT Angiogram Head Neck    Result Date: 03/11/2022  HISTORY: Left upper extremity numbness, stroke like symptoms COMPARISON: None available. TECHNIQUE: CT angiogram of the head and neck performed with 85 mL of Omnipaque 350 intravenous contrast. Multiplanar reformatted and 3D maximum intensity projection (MIP) images were created and reviewed. Proximal internal carotid artery narrowing was determined utilizing NASCET methodology. The scan was analyzed utilizing Viz ContaCT AI algorithm for LVO detection and computer aided triage.  The following dose reduction techniques were utilized: automated exposure control and/or adjustment of the mA and/or KV according to patient size, and the use of an iterative reconstruction technique. FINDINGS:  There is fusiform aneurysm dilatation of the ascending aorta, which is partially imaged. It measures up to approximate 4.8 cm transversely.  The right common carotid artery is patent. There is mild partially calcified atherosclerotic plaque at the carotid bifurcation.  A linear defect  along the posterior margin of the carotid bulb may reflect ulcerated plaque or potentially carotid web. There  is no stenosis of the proximal right internal carotid artery. The more distal cervical right internal carotid  artery is patent.  The intracranial right internal carotid artery is patent. The left common carotid artery is patent.  There is mild to moderate partially calcified atherosclerotic plaque at the carotid bifurcation.  There is no stenosis of the proximal left internal carotid artery.  The more distal cervical left internal carotid artery is patent.  The intracranial left internal carotid artery is patent. The left vertebral artery is dominant. The cervical vertebral arteries are patent. The intracranial vertebral arteries are patent. The basilar artery is patent and normal in caliber.  The proximal visualized portions of the anterior cerebral, middle cerebral, and posterior cerebral arteries are patent.     1.No significant stenosis or large branch vessel occlusion is identified in the intracranial vasculature. 2.There is atherosclerotic disease of the carotid bifurcations, but there is no stenosis of the proximal internal carotid arteries based on NASCET criteria.  A linear defect along the posterior margin left carotid bulb may reflect ulcerated plaque or potentially a carotid web. 3.The vertebrobasilar system is patent. 4.There is fusiform aneurysmal dilatation of the ascending aorta that measures up to approximately 4.8 cm transversely. This finding is better evaluated on the prior CTA of the chest/abdomen performed previously today. Nicoletta Dress, MD 03/11/2022 11:37 AM    CT Head WO Contrast    Result Date: 03/11/2022  HISTORY: Acute neurologic deficit. Left arm numbness. COMPARISON: None. TECHNIQUE: CT of the head performed without intravenous contrast. The following dose reduction techniques were utilized: automated exposure control and/or adjustment of the mA and/or KV according to patient size, and the use of an iterative reconstruction technique. FINDINGS: There is mild bilateral cerebral and cerebellar volume loss.  There is mild subcortical, deep, and periventricular leukomalacia in both cerebral  hemispheres. No parenchymal mass, acute  intracranial hemorrhage, or hydrocephalus is detected.  No extra-axial (subdural or epidural) collection is seen.    The basilar cisterns are clear. The calvarium and skull base are intact. No skull base fracture or calvarial fracture is seen.   The soft tissues are unremarkable. The paranasal sinuses show mild mucosal thickening.     1. No acute intracranial hemorrhage is detected. 2. There is mild subcortical, deep, and periventricular leukomalacia in both cerebral hemispheres.  This is suggestive of chronic small vessel ischemic change. Theodoro Doing, MD 03/11/2022 11:20 AM    CT Angio AAA Chest/ Abdomen    Result Date: 03/11/2022  HISTORY: Aortic disease.    COMPARISON: None available. TECHNIQUE: CTA chest and abdomen through the level of the aortic bifurcation WITH intravenous contrast. 100 mL IV Omnipaque 350 was administered. Oral contrast was not administered. 3D MIP images are submitted and reviewed. The following dose reduction techniques were utilized: Automated exposure control and/or adjustment of the mA and/or kV according to patient size, and the use of iterative reconstruction technique. FINDINGS: AORTA: Atherosclerotic disease of the intrathoracic aorta with aneurysmal dilatation of the ascending thoracic aorta measuring 4.7 x 4.7 cm. No evidence of intrathoracic aortic dissection. Scattered atheromatous calcified plaque of the intra-abdominal aorta with aneurysmal dilatation of infrarenal abdominal aorta measuring 3.4 x 3.4 cm. No evidence of intra-abdominal aortic dissection. The celiac axis, SMA, and bilateral renal arteries are patent. IMA demonstrates faint opacification. LINES/TUBES: None. CHEST: LUNGS: No consolidation. Mild bibasilar atelectasis. PLEURA: No pleural effusions or pneumothorax.  HEART: Not enlarged. MEDIASTINUM: No axillary, hilar or mediastinal lymphadenopathy. PULMONARY ARTERIES:    ABDOMEN: LIVER/BILIARY TREE: No mass or intrahepatic  biliary dilatation. No gallbladder distension or calcified gallstones. SPLEEN: No splenomegaly. PANCREAS: No pancreatic mass or duct dilatation. Atrophic. KIDNEYS/URETERS: No hydronephrosis, stones or solid mass lesions. ADRENALS: No adrenal mass. PERITONEUM/RETROPERITONEUM: No free air or fluid. LYMPH NODES: No lymphadenopathy. GI TRACT: No bowel obstruction. Appendix appears within normal limits. BONES AND SOFT TISSUES: Degenerative changes of the visualized spine. Remote appearing left lateral ninth rib fracture.     1. 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 Judd Gaudier, MD 03/11/2022 1:20 AM    CT Head without Contrast    Result Date: 03/11/2022  HISTORY: Dizziness. COMPARISON: None available. TECHNIQUE: CT of the head performed without intravenous contrast. The following dose reduction techniques were utilized: automated exposure control and/or adjustment of the mA and/or KV according to patient size, and the use of an iterative reconstruction technique. FINDINGS: Mild cerebral and cerebellar parenchymal volume loss. Mild supratentorial leukomalacia which is nonspecific however most compatible with chronic microvascular ischemic changes. Intracranial atherosclerotic calcifications are noted. No acute territorial infarct, intracranial hemorrhage, mass effect, midline shift, tonsillar herniation or extra-axial fluid collections. Ventricles and basilar cisterns are intact. Visualized paranasal sinuses demonstrate no significant opacification. No calvarial fracture. Scattered scalp soft tissue calcifications are noted.     No acute intracranial abnormality. Judd Gaudier, MD 03/11/2022 1:12 AM    Chest AP Portable    Result Date: 03/10/2022  HISTORY: Left-sided chest pain, shortness of breath COMPARISON: None FINDINGS: The cardiomediastinal silhouette is normal. No effusions or focal infiltrates are identified. The  pulmonary vascular pattern is normal.     No acute cardiopulmonary disease. Collene Schlichter, MD 03/10/2022 10:43 PM    Pathology:   Specimens (From admission, onward)      None          Pending Lab Results:   Labs/Images to be followed at your PCP office:   Unresulted Labs       None          Hospitalist:     Signed by: [address] Elder Love, MD  03/12/2022 6:56 PM  Time spent for discharge including face-to-face and non-face-to-face: > 30 minutes      *This note was generated by the Epic EMR system/ Dragon speech recognition and may contain inherent errors or omissions not intended by the user. Grammatical errors, random word insertions, deletions, pronoun errors and incomplete sentences are occasional consequences of this technology due to software limitations. Not all errors are caught or corrected. If there are questions or concerns about the content of this note or information contained within the body of this dictation they should be addressed directly with the author for clarification

## 2022-03-12 NOTE — SLP Plan of Care Note (Signed)
Speech Language Pathology Cancellation Note    Patient: Jason Bullock  ZOX:[id number]    Unit: J811/B147.A    Patient not seen for speech language pathology therapy secondary to MRI negative for CVA. SLP services not indicated at this time.  Truett Mainland M.S. CCC-SLP   Pager id [phone]

## 2022-03-12 NOTE — Procedures (Signed)
ROUTINE EEG REPORT    Patient Name: Jason Bullock   DOB: 05-Apr-1946   Date of study: 03/12/22          History: 76 yo M who had a transient episode of left hand shaking and confusion.    Method:  This routine EEG was performed using the standard international 10/20 Electrode Placement system with the an additional EKG electrode.    Patient state: Awake, drowsy, asleep          Description:  In the awake state, a well-sustained well-organized posteriorly dominant rhythm of 9-10 Hz is seen, and is reactive to eye closure. Occasional, brief polymorphic delta slowing is seen over the left frontotemporal region.     Drowsiness produces the appearance of generalized theta slowing. Vertex waves are seen, marking stage I sleep. Sleep spindles and K-complexes are seen, marking stage II sleep.     No epileptiform abnormalities are seen.  No clinical events are captured.      Photic stimulation produces no abnormal response.     Single lead EKG shows no apparent arrhythmias.    Findings:  1) Intermittent focal left frontotemporal slowing    Impression: This is an abnormal routine awake and asleep EEG due to the presence of intermittent focal left frontotemporal slowing, suggestive of focal cortical dysfunction in the same region. No epileptiform activity is seen. Clinical correlation is recommended.            Reinaldo Raddle, MD  [facility]

## 2022-03-12 NOTE — Plan of Care (Signed)
Problem: Chest Pain  Goal: Vital signs and cardiac rhythm stable  Outcome: Progressing  Flowsheets (Taken 03/12/2022 1202)  Vital signs and cardiac rhythm stable:   Monitor /assess vital signs/cardiac rhythms   Monitor labs   Assess the need for oxygen therapy and administer as ordered  Goal: Cardiac pain management  Outcome: Progressing  Flowsheets (Taken 03/12/2022 1202)  Cardiac pain management:   Assess/report chest pain/or related discomfort to LIP immediately   Instruct patient to report any change in pain status   Assess pain/or related discomfort on admission, during daily assessment, before and after any intervention   Include patient and patient care companion in decisions related to pain management  Goal: Anxiety management/effective coping  Outcome: Progressing  Flowsheets (Taken 03/12/2022 1202)  Anxiety management/effective coping:   Assess/report uncontrolled anxiety, depression or ineffective coping to LIP   Offer reassurance to decrease anxiety   Encourage patient to immediately report any increase in anxiety and/or depression   Include patient in decision making of their care and give updates on their health status  Goal: Patient/Patient Care Companion demonstrates understanding of disease process, treatment plan, medications, and discharge plan  Outcome: Progressing  Flowsheets (Taken 03/12/2022 1202)  Patient/Patient Care Companion demonstrates understanding of disease process, treatment plan, medications and discharge plan:   Educate patient to immediately report any chest pain/equivalent to RN   Reinforce with patient their activity level and to avoid Valsalva   Nutrition consult as needed   Reinforce regarding cardiac diet and any fluid parameters   Assist patient/patient care companion to identify measures for cardiac risk factor management   Consult/collaborate with Cardiac Rehabilitation   Assess need for smoking cessation and substance abuse counseling and refer as needed     Problem: Side  Effects from Pain Analgesia  Goal: Patient will experience minimal side effects of analgesic therapy  Outcome: Progressing  Flowsheets (Taken 03/12/2022 1202)  Patient will experience minimal side effects of analgesic therapy:   Monitor/assess patient's respiratory status (RR depth, effort, breath sounds)   Assess for changes in cognitive function   Prevent/manage side effects per LIP orders (i.e. nausea, vomiting, pruritus, constipation, urinary retention, etc.)   Evaluate for opioid-induced sedation with appropriate assessment tool (i.e. POSS)     Problem: Moderate/High Fall Risk Score >5  Goal: Patient will remain free of falls  Outcome: Progressing  Flowsheets (Taken 03/12/2022 1000)  Moderate Risk (6-13):   MOD-Consider a move closer to [employer]   MOD-Remain with patient during toileting   MOD-Place bedside commode and assistive devices out of sight when not in use   MOD-Re-orient confused patients   MOD-Utilize diversion activities   MOD-Perform dangle, stand, walk (DSW) prior to mobilization   MOD-Request PT/OT consult order for patients with gait/mobility impairment   MOD-Use gait belt when appropriate

## 2022-03-12 NOTE — Progress Notes (Signed)
Routine EEG completed as per order. Report to follow.

## 2022-03-12 NOTE — Progress Notes (Signed)
EEG tech notified of order, states tech will arrive shortly

## 2022-03-12 NOTE — Progress Notes (Signed)
Orthostatic Blood pressure     03/12/22 1036 03/12/22 1038 03/12/22 1040   Vital Signs   Heart Rate 71 78 75   BP 138/79 135/86 136/79   Patient Position Lying Sitting Standing

## 2022-03-12 NOTE — UM Notes (Addendum)
Order: ADMIT TO OBSERVATION (OUTPATIENT WITH OBSERVATION SERVICES) (Order #[id number]) on 03/11/22    Patient Name: Jason Bullock   Patient DOB: 09-Dec-1945    Auth #: [id number]    Review for DOS: 03/11/22    DIAGNOSIS    ICD-10-CM    1. Chest pain  R07.9         PRESENTED TO ED: 03/10/2022 at 2122     Reason For Hospitalization:    Pt is a 76 y.o. male who presented to [facility] with chest pain along with shortness of breath and dyspnea on exertion.  Patient mainly presented due to chest pain.  Dyspnea exertion/shortness of breath has been more subacute/chronic.      PMH:  has a past medical history of Atrial fibrillation.    Past Surgical History:   Procedure Laterality Date    ABLATION OF DYSRHYTHMIC FOCUS       Vitals:    ED Triage Vitals   Enc Vitals Group      BP 03/10/22 2020 153/88      Heart Rate 03/10/22 2020 73      Resp Rate 03/11/22 0030 16      Temp 03/10/22 2020 97.8 F (36.6 C)      Temp Source 03/10/22 2020 Temporal      SpO2 03/10/22 2020 95 %      Weight 03/10/22 2045 115.3 kg (254 lb 3.1 oz)      Height 03/10/22 2020 1.88 m (6\' 2" )      Pain Score 03/10/22 2020 4     ED Abnormal Labs:     03/11/22 04:40 03/11/22 10:23   Hematocrit 50.6 (H) 49.9 (H)   Platelet Count 148 140 (L)      03/11/22 04:40 03/11/22 10:23   Glucose 108 (H) 113 (H)     Radiologic Exams:   MRI brain without contrast    Result Date: 03/11/2022   No evidence of acute intracranial abnormality. Susy Frizzle, MD 03/11/2022 3:53 PM    CT Angiogram Head Neck    Result Date: 03/11/2022  1.No significant stenosis or large branch vessel occlusion is identified in the intracranial vasculature. 2.There is atherosclerotic disease of the carotid bifurcations, but there is no stenosis of the proximal internal carotid arteries based on NASCET criteria.  A linear defect along the posterior margin left carotid bulb may reflect ulcerated plaque or potentially a carotid web. 3.The vertebrobasilar system is patent. 4.There is  fusiform aneurysmal dilatation of the ascending aorta that measures up to approximately 4.8 cm transversely. This finding is better evaluated on the prior CTA of the chest/abdomen performed previously today. Nicoletta Dress, MD 03/11/2022 11:37 AM    CT Head WO Contrast    Result Date: 03/11/2022  1. No acute intracranial hemorrhage is detected. 2. There is mild subcortical, deep, and periventricular leukomalacia in both cerebral hemispheres.  This is suggestive of chronic small vessel ischemic change. Theodoro Doing, MD 03/11/2022 11:20 AM    CT Angio AAA Chest/ Abdomen    Result Date: 03/11/2022  1. 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 Judd Gaudier, MD 03/11/2022 1:20 AM    CT Head without Contrast    Result Date: 03/11/2022  No acute intracranial abnormality. Judd Gaudier, MD 03/11/2022 1:12 AM    Chest AP Portable    Result Date: 03/10/2022  No acute cardiopulmonary disease. Collene Schlichter, MD 03/10/2022 10:43 PM  EKG:   EKG Results       Procedure Component Value Units Date/Time    ECG 12 lead [[id number]] Collected: 03/10/22 2010     Updated: 03/11/22 1604    Narrative:      SINUS RHYTHM WITH 1ST DEGREE A-V BLOCK  ANTEROSEPTAL MYOCARDIAL INFARCTION , AGE UNDETERMINED  ABNORMAL ECG  NO PREVIOUS ECGS AVAILABLE  Confirmed by Vilma Prader [phone]) on 03/11/2022 4:04:12 PM     ED meds: IV Reglan 10mg  x 1    Transfer to [facility] as Observation.    Scheduled Meds:  Current Facility-Administered Medications   Medication Dose Route Frequency    apixaban  5 mg Oral Q12H SCH    atorvastatin  40 mg Oral QHS    cetirizine  10 mg Oral Daily    dilTIAZem  120 mg Oral Daily    empagliflozin  12.5 mg Oral QAM    furosemide  40 mg Oral Daily    spironolactone  12.5 mg Oral Daily    vitamins/minerals  1 tablet Oral Daily     Medicine Assessment and Plan:  Assessment/Plan:   Chest pain  Shortness of breath  HFpEF  Paroxysmal  atrial fibrillation on Eliquis s/ p ablation (2017 and 2021)  Hypertension  Hyperlipidemia  Hx of AAA  Obesity     This is a 76 year old male who presents with chest pain along with shortness of breath and dyspnea on exertion.  Patient mainly presented due to chest pain.  Dyspnea exertion/shortness of breath has been more subacute/chronic.  Lives in [place] in [place] and seen at the [place] and with cardiologist down at [facility].  Troponins flat/negative, patient pain-free CT scan shows no evidence of AAA and patient with trace edema lower extremities.  Patient has been seen by [facility] cardiology when he visits his daughter here in [name] also.  We will consult [facility] for further evaluation in a.m. Sent secure chat to [facility] for consult in AM.     Other chronic medical conditions appear to be stable. Continue outpatient medications to include Eliquis.  ---------------------------------------------------------------------    Cardiology Consult:  Reason for Consultation:   Exertional dyspnea     History of Present Illness:   Jason Bullock is a 76 y.o. male admitted on 03/10/2022.  We have been asked by Bobbye Charleston, MD to provide cardiac consultation regarding exertional dyspnea.     Patient is a 76 year old man with a history of pAF s/p multiple ablations, HFpEF, HTN, HLD who we were asked to see for exertional dyspnea.  Patient reported that he has some degree of exertional dyspnea at baseline and difficulty ambulating due to balance problems and poor hand-eye coordination.  He noted that yesterday his dyspnea was worse than his typical baseline, exacerbated by walking.  He also experienced some substernal chest discomfort radiating to the left arm, which he has also experienced in the past, but was worse yesterday than usual.  He said that this discomfort was not worsened with exertion.  It lasted for over 6 hours yesterday before  resolving.     On presentation to the ED, BP was 153/88 with otherwise stable VS.  ECG was unremarkable. NT-proBNP was 74 pg/mL.  Trops were neg x2.  CTA chest redemonstrated known thoracic aortic aneurysm without dissection.    ASSESSMENT:   Patient is a 76 y.o. male with the following relevant diagnoses:     Admitted April  2023 for exertional dyspnea and atypical chest discomfort.  Paroxysmal atrial fibrillation  CHADS2-Vasc 3 (HTN, age x2) on apixaban  S/p ablation 2015, 2017, 2021  HFpEF  Hypertension  Hyperlipidemia  Thoracic aortic aneurysm  CTA April 2023: Ascending Ao 4.7 cm  Echo 2021: Ascending Ao 4.8 cm, infrarenal abd Ao 3.4 cm.  Echo 2021: EF 60%, mildly dilated RV with normal function, trace AR, neg bubble study, TAA as above     RECOMMENDATIONS:      Initial cardiac evaluation for dyspnea/atypical chest pain is reassuring, with normal ECG, serial troponins, NT-proBNP and CTA chest.  Patient is euvolemic on exam with normal natriuretic peptides- low suspicion for CHF.  Recommend expedited outpatient stress test and echocardiogram.  Patient to contact his cardiologist in [place] to pursue this.  Would continue all cardiac medications.  ---------------------------------------------------------------------------    Significant Event @ 1046:  Code stroke was called with CC of   Dizziness   Lost control of LEFT arm slurred speech and transient  Memory impairment  Neuro exam: Currently alert awake oriented x3.  No acute distress.  Speech intact.  Cranial nerves II to XII intact bilateral.  Bilateral upper extremity muscle strength 5/5.  Bilateral lower extremity muscle strength 5/5.  Gait not checked     Case discussed with neurologist on-call Dr. Alvino Chapel   Discussed with patient; agreeable with CTA head neck and CT head and MRI.  Patient's GFR is 53.7.  Gentle IV hydration.  Patient received Eliquis today at 9 AM  Stroke protocol  initiated  -----------------------------------------------------------    Neurology Consult:  Reason for consult:  Possible TIA     Chief Complaint:  Left hand shaking and transient loss of memory     History of Present Illness:  76 year old man had chest pain followed by left hand movements that was no voluntary that resolved in seconds.  Patient was not aware, but nurse that witnessed noticed that there was some confusion earlier today and during event that patient does not recall.  There was no numbness or weakness or slurring of his speech during the event.  Patient report that this has happened to him in the past multiple times.  He has had stress cardiac evaluation.  He feels like he is back to normal.  ASSESSMENT AND RECOMMENDATION:     Transient confusion with left hand shaking - the event not consistent with TIA but more concern for partial seizure being that there is loss of memory of some recent event.   Patient has had multiple events with similar features where there is shaking of his hand that last less than a minute with resolution.    - He will get EEG and see neurologist at [facility].  I have left a message for his [place] nurse about my recommendation.     Medicine Addendum:  Findlay Dagher is a 76 y.o. male w PMH of  of hypertension, hyperlipidemia,chronic diastolic  heart failure , paroxysmal atrial fibrillation on Eliquis was admitted via  the ED with about 2 weeks of not feeling very well with increasing shortness of breath and dyspnea on exertion.    Patient also reported some increased abdominal girth.    In the ED patient was hemodynamically stable.  Patient had no chest pain upon arrival. Laboratory evaluation notable for normal CBC and CMP with glucose of 141.  Lipase was 46, D-dimer was normal at 0.44,   troponin x 2 neg    Chest x-ray was  unremarkable.  EKG shows sinus rhythm first-degree AV block rate of 68 with poor R wave progression.  Patient with history of AAA thusly ED ordered CT  angiogram of the chest.  CT of the chest was unremarkable.  Chest x-ray showed no acute cardiopulmonary disease and CT of the head was negative/normal.  Cardiology has evaluated the patient and recommended to continue home medications and outpatient follow-up with primary cardiologist for echo and stress test as outpatient.  Code stroke was called earlier this morning; CT head, CTA head neck, MRI brain negative for any acute stroke.  Neurology consulted: Concerns for seizure.  EEG  was recommended to be done w [facility] but pt wants it ti be done here ; ordered .     S/p code stroke 4.26.23  No focal deficit   As per neuro   Transient confusion with left hand shaking - the event not consistent with TIA but more concern for partial seizure being that there is loss of memory of some recent event.     Patient has had multiple events with similar features where there is shaking of his hand that last less than a minute with resolution.    - He will get EEG and see neurologist at [facility].    The neurologist  has left a message for his [place] nurse about  recommendation.   But the RN called back asking for it to be done here ; so does the pt   EEG ordered   Stroke protocol can be [place]'ed per neuro team   No need for echo here as MRI neg   Cont current mx   [place] ASA cont eliquis   Check LIPID panel and A1 c .    Exertional dyspnea/atypical chest pain.  Acute MI ruled out.  Troponin is negative.  EKG as mentioned above.  Cardiology evaluation completed and recommended to continue home medications and outpatient follow-up with primary cardiologist for outpatient echocardiogram and stress test.     Paroxysmal atrial fibrillation  CHADS2-Vasc 3 (HTN, age x2) on apixaban  S/p ablation 2015, 2017, 2021  Continue home medication Cardizem and Eliquis.    This clinical review is based on/compiled from documentation provided by the treatment team within the patient's medical record.    Payor: Advertising copywriter / Plan: [employer] / Product Type: COMMERCIAL /     Izabel Chim, RN, BSN  Utilization Review   Phone: [phone] (voicemail only)  Fax: [phone]  [email or web address]

## 2022-03-12 NOTE — Progress Notes (Signed)
Discharge instructions, medication, and follow up appointments given. All questions and concerns addressed.  IV discontinued without incident.Patients medications retrieved from pharmacy and returned to patient.    Patient accompanied by daughter for discharge. Patient was offered wheelchair assistance to the [employer], he declined and ambulated without incident to the [employer]. Daughter is transporting patient.

## 2022-03-12 NOTE — Progress Notes (Signed)
Initial Case Management Assessment and Discharge Planning  [facility]   Patient Name: Jason Bullock, Jason Bullock   Date of Birth November 09, 1946   Attending Physician: Sunny Schlein, MD   Primary Care Physician: Pcp, None, MD   Length of Stay 0   Reason for Consult / Chief Complaint Pt presented to [facility] with initial CC of Abdominal Pain and Shortness of Breath          Situation   Admission DX:   1. Chest pain        A/O Status: X 3  Patient admitted from: ER  Admission Status: observation    Health Care Agent: Child  Name: Jason Bullock   Phone number: [phone]     Background   Residence: Multi-story home    Living situation: lives at home independently    Pt will d/c to daughters home at [address] [place], [place] and will stay locally until May 10th before returning to [place].     Pt daughter residence has 6 steps to enter able to live on main level with walk-in shower.     Pt drives at baseline and lives alone in [place]. Daughter is local support system and drives as well.       PCP: PCP None, MD - [facility]    Patient Contact:   [phone] (home)     [phone] (mobile)     Emergency contact:   Extended Emergency Contact Information  Primary Emergency Contact: Jason Bullock  Mobile Phone: [phone]  Relation: Daughter     Support system: Immediate Family, Inlaw, and Child    ADL's: Independent    Previous Level of function: Independent with all ADLs    Baseline Mobility: walking independently without device    DME: None    Pharmacy:  [facility] [phone] - [address] [place] [address]  [phone]  [place] [address]  Phone: [phone] Fax: [phone]       Prescription Coverage: Yes    Home Health: The patient is not currently receiving home health services.- has referral to Cardiac Rehab May 17th with Dr. Margart Sickles in [facility]     Previous SNF/AR: Pt denies     Date First IMM given: N/A    COVID Vaccine Status: Pfizer- x3    UAI on file?:  No    Advanced Directives: Has NO advance directive and not interested in additional information.     Agreeable to Home with family post-discharge:  Yes     Assessment   Assessment completed at bedside with Patient    Pt expects to d/c Home with family/friends social support    CM discussed d/c planning with pt and d/c transport.     Barriers to discharge include No barriers identified at this time     Recommendation   D/C Plan A: Home with family    D/C Plan B: Home with home health    Transport for discharge? Daughter to transport POV     CM to review med list and watch for [facility] needs.     Final dispo pending PT/OT eval and medical clearance.     CM will continue to follow for d/c planning needs.      Helayne Seminole, LMSW  Social Work Case Manager  [facility]  [address]  [place], [place] [address]  [phone]

## 2022-03-12 NOTE — OT Eval Note (Addendum)
Occupational Therapy Evaluation  Patient: Jason Bullock    [id number]/W106.A  Discharge Recommendations:   Based on today's session: Home with supervision     If pt returns home, DME Recommended for Discharge: Tub transfer bench (HH shower hose)          Unit: [facility] [address]  Bed: W106/W106.A      Assessment:   Jason Bullock is a 76 y.o. male admitted 03/10/2022.  Pt initially presented with chest pain and developed L UE arm weakness/hand shaking and slurred speech  during admission, however currently appears at baseline level for ADLs. No further inpatient OT required. Will D/C.  Assessment: Appears to be at baseline for ADL's     Complexity Chart Review Performance Deficits Clinical Decision Making Hx/Comorbidities Assistance needed   Low Brief 1-3 Limited options None None (or at baseline)   Moderate Expanded 3-5 Several Options 1-2 Min/Mod assist (not at baseline)   High Extensive 5 or more Multiple options 3 or more Max/dependent (not at baseline     Therapy Diagnosis: None      Plan:   OT Frequency Recommended: one time visit - therapy discontinued   Treatment Interventions: No skilled interventions needed at this time     Patient Goal  Patient Goal: to go home    Risks/Benefits/POC Discussed with Pt/Family: With patient      Goals: N/A due to no further skilled OT intervention warranted at this time.                                           [id number]/W106.A    Time of treatment: Time Calculation  OT Received On: 03/12/22  Start Time: [id number]  Stop Time: 0945  Time Calculation (min): 21 min  OT Visit Number: 1    Consult received for Jason Bullock for OT Evaluation and Treatment.  Patient's medical condition is appropriate for Occupational therapy intervention at this time.    Precautions and Contraindications: Falls Risk          Medical Diagnosis: Chest pain [R07.9]    History of Present Illness: Jason Bullock is a 76 y.o. male admitted on 03/10/2022 with "chest pain along with shortness of breath  and dyspnea on exertion.  Patient mainly presented due to chest pain.  Dyspnea exertion/shortness of breath has been more subacute/chronic.  [place]ives in [place] and [facility] and with [facility].  Troponins flat/negative, patient pain-free CT scan shows no evidence of AAA and patient with trace edema lower extremities.  Patient has [facility] when he visits his daughter here in [place] also.  We will consult [facility] for further evaluation in a.m. Sent secure [facility] to [facility] for consult in AM.     Other chronic medical conditions appear to be stable.  Continue outpatient medications to include Eliquis."--as per H & P note.     Patient Active Problem List   Diagnosis    Chest pain        Past Medical/Surgical History:  Past Medical History:   Diagnosis Date    Atrial fibrillation       Past Surgical History:   Procedure Laterality Date    ABLATION OF DYSRHYTHMIC FOCUS           X-Rays/Tests/Labs:  MRI brain without contrast    Result Date: 03/11/2022   No evidence of acute intracranial abnormality. Susy Frizzle, MD 03/11/2022 3:53 PM    CT Angiogram Head Neck    Result Date: 03/11/2022  1.No significant stenosis or large branch vessel occlusion is identified in the intracranial vasculature. 2.There is atherosclerotic disease of the carotid bifurcations, but there is no stenosis of the proximal internal carotid arteries based on NASCET criteria.  A linear defect along the posterior margin left carotid bulb may reflect ulcerated plaque or potentially a carotid web. 3.The vertebrobasilar system is patent. 4.There is fusiform aneurysmal dilatation of the ascending aorta that measures up to approximately 4.8 cm transversely. This finding is better evaluated on the prior CTA of the chest/abdomen performed previously today. Nicoletta Dress, MD 03/11/2022 11:37 AM    CT Head WO Contrast    Result Date:  03/11/2022  1. No acute intracranial hemorrhage is detected. 2. There is mild subcortical, deep, and periventricular leukomalacia in both cerebral hemispheres.  This is suggestive of chronic small vessel ischemic change. Theodoro Doing, MD 03/11/2022 11:20 AM    CT Angio AAA Chest/ Abdomen    Result Date: 03/11/2022  1. 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 Judd Gaudier, MD 03/11/2022 1:20 AM    CT Head without Contrast    Result Date: 03/11/2022  No acute intracranial abnormality. Judd Gaudier, MD 03/11/2022 1:12 AM    Chest AP Portable    Result Date: 03/10/2022  No acute cardiopulmonary disease. Collene Schlichter, MD 03/10/2022 10:43 PM       Social History:  Prior Level of Function  Prior level of function: Independent with ADLs, Ambulates independently  Baseline Activity Level: Community ambulation  Driving: independent  Cooking: microwave only  Employment: Retired  Cabin crew Arrangements: Alone  Type of Home: House  Home Layout: Able to live on main level with bedroom/bathroom, Performs ADL's on one level  Bathroom Shower/Tub: Pension scheme manager: Standard  Bathroom Equipment: Hand-held shower, Built-in shower seat      Subjective:   Patient is agreeable to participation in the therapy session. Nursing clears patient for therapy.  Subjective: Patient reporting he is feeling better today  Pain Assessment  Pain Assessment: No/denies pain.        Objective:   Observation of Patient/Vital Signs:  Patient is seated at edge of bed with telemetry and peripheral IV in place.         Cognition/Neuro Status  Arousal/Alertness: Appropriate responses to stimuli  Attention Span: Appears intact  Orientation Level: Oriented X4  Memory: Appears intact  Following Commands: independent  Safety Awareness: independent  Insights: Fully aware of deficits  Problem Solving: Able to problem solve  independently  Behavior: attentive;calm;cooperative  Motor Planning: intact  Coordination: intact (for B hand serial opposition)  Hand Dominance: right handed    Gross ROM  Right Upper Extremity ROM: within functional limits  Left Upper Extremity ROM: within functional limits  Gross Strength  Right Upper Extremity Strength: 4/5 (grip good)  Left Upper Extremity Strength: 4/5 (grip good)          Sensory  Auditory: impaired left;impaired right;hearing aid left;hearing aid right  Tactile - Light Touch: intact (B UEs; Patient reporting h/o neuropathy B LEs)       Self-care and Home Management  Grooming: Supervision;standing at sink;supervision/safety;wash/dry hands  Functional Transfers: Supervision;toilet transfer;supervision/safety    Mobility and  Transfers  Sit to Stand: Supervision (from bed)  Stand to Sit : Supervision   Bed to Toilet Transfer: Supervision w/o A.D.  Functional mobility: Supervision w/ A.D.     Balance  Static Sitting Balance: good  Dyanamic Sitting Balance: good  Static Standing Balance: good  Dynamic Standing Balance: good    Participation and Endurance  Participation Effort: good  Endurance: Tolerates 10 - 20 min exercise with multiple rests; Patient denied feeling lightheaded or dizzy during the session.     AM-PACT "6 Clicks" Daily Activity Inpatient Short Form  Inpatient AM-PACT Performed?: yes  Put On/Take Off Lower Body Clothing: A little  Assist with Bathing: A little  Assist with Toileting: A little  Put On/Take Off Upper Body Clothing: A little  Assist with Grooming: A little  Assist with Eating: None  OT Daily Activity Raw Score: 19  CMS 0-100% Score: 42.80%    PMP - Progressive Mobility Protocol   PMP Activity: Step 7 - Walks out of Room  Distance Walked (ft) (Step 6,7): 80 Feet    Treatment Activities: Discussed with Patient home safety recommendations for bathroom use. Recommendations made for transfer tub bench, hand-held shower hose for energy conservation and fall prevention. Also  advised Patient to have Supervision with shower transfers and showering upon d/c to ensure safety. Patient provided with purchasing information on the above mentioned DME and was receptive to same. Patient educated in LB dressing techniques with recommendations made to sit to initiate pants/underwear over feet, then to stand to pull pants up over hips for fall prevention/energy conservation. Advised Patient to have Supervision for safety upon d/c to home with all mobility and ADL's. Patient mentioning during the session that he experiences "vertigo" whenever he looks up which has been ongoing for years. RN notified of same. Patient seated at EOB with all needs within reach. Patient instructed to ring for nursing for all needs and appeared receptive to all education provided. Bed Alarm activated for Patient's safety and RN notified of session outcome.         Educated the patient to role of occupational therapy, plan of care, goals of therapy and safety with mobility and ADLs, home safety.          Therapist PPE during session procedural mask and gloves    Tennis Ship. Trixie Deis, MS,OTR/L  Pager # [phone]  [phone]

## 2022-03-19 ENCOUNTER — Ambulatory Visit (INDEPENDENT_AMBULATORY_CARE_PROVIDER_SITE_OTHER): Payer: Self-pay | Admitting: Family

## 2023-08-12 ENCOUNTER — Encounter: Payer: Self-pay | Admitting: Rehabilitative and Restorative Service Providers"

## 2023-09-09 ENCOUNTER — Inpatient Hospital Stay: Payer: Non-veteran care | Admitting: Rehabilitative and Restorative Service Providers"

## 2024-05-24 ENCOUNTER — Ambulatory Visit: Attending: Physical Therapist | Admitting: Physical Therapist

## 2024-05-24 DIAGNOSIS — Z7901 Long term (current) use of anticoagulants: Secondary | ICD-10-CM | POA: Insufficient documentation

## 2024-05-24 DIAGNOSIS — M6281 Muscle weakness (generalized): Secondary | ICD-10-CM | POA: Insufficient documentation

## 2024-05-24 DIAGNOSIS — R262 Difficulty in walking, not elsewhere classified: Secondary | ICD-10-CM | POA: Insufficient documentation

## 2024-05-24 DIAGNOSIS — R2689 Other abnormalities of gait and mobility: Secondary | ICD-10-CM | POA: Insufficient documentation

## 2024-05-24 DIAGNOSIS — R279 Unspecified lack of coordination: Secondary | ICD-10-CM | POA: Insufficient documentation

## 2024-05-24 DIAGNOSIS — G5793 Unspecified mononeuropathy of bilateral lower limbs: Secondary | ICD-10-CM | POA: Insufficient documentation

## 2024-05-24 NOTE — PT Eval Note (Signed)
 [facility]  [address], [address]  [place], [place]  [address]  Phone:  [phone]  Fax:  [phone]    PHYSICAL THERAPY EVALUATION AND PLAN OF CARE      Referred By: Mardy Dicker, MD    *I agree to the plan of care stated below*                                                                                                                                           Physician Signature      Date      PATIENT: Jason Bullock DOB: 03-Jan-1946   MR #: [id number]  AGE: 78 y.o.    FACILITY PROVIDER #: [phone] PRIMARY MD: Pcp, None, MD    HICN# Medicare Sub. Num: [id number] DIAGNOSES: Long term (current) use of anticoagulants [Z79.01]      Date of Service PT Received On: 05/24/24   Treatment Time Start Time: 1400 to Stop Time: 1500   Time Calculation Time Calculation (min): 60 min   Visit # PT Visit  PT Visit Number: 1  PT- Certification Start Date: 05/24/24  PT- Certification End Date: 08/21/24   Units Billed PT Evaluation  $ PT Evaluation Moderate Complexity (97162): 1 Procedure Therapeutic Interventions  $ PT Therapeutic Activity (97530): 1 unit ( )     CERTIFICATION PERIOD: 05/24/2024 to 08/21/2024    TREATMENT DIAGNOSIS:  Balance Problems R26.89, Difficulty in Walking R26.2, Generalized Muscle Weakness M62.81, Neuropathy of B LE G57.91 / G57.92, and Lack of Coordination R27.9      Medications:  has a current medication list which includes the following prescription(s): acetaminophen , apixaban , carboxymethylcellulose, diltiazem , empagliflozin , furosemide , loratadine, multiple vitamins-minerals, and spironolactone .    Patient reports taking the following medications at this time:  See scanned chart    Precautions:   Fall Risk - Patient/caregiver instructed in fall prevention strategies and recommendations.  HBP / CAD  Pacemaker  On anti-coagulant  A-fib  Known aortic aneurysm  Heart failure     Allergies:  Allergies[1]    EXAMINATION /  EVALUATION:    Past Medical History:  Jason Bullock  has a past medical history of Atrial fibrillation.    Past Surgical History:  Jason Bullock  has a past surgical history that includes Ablation of dysrhythmic focus.      History of present illness:  Date of onset: 5 years ago  Jason Bullock is a 78 y.o. male reporting he is having a hard time keeping his endurance. Gets tired with unloading the dishwasher. Then he gets very clumsy. Has a pacemaker in for Afib approximately 2 years ago. They told him he would feel better but he does not feel any better. Endurance has decreased in last 5 years. Reports he did have fall prior to pace maker and was walking home from [employer]  and tripped and could not slow himself down falling tot he ground. This really worried him. Has not had any falls since then but has had tripping and has to pay close attention to his movement.   Starting to see a difference in his peripheral awareness about what is going on around him   Has stiffness in spine and turning over is difficulty. Always has to check mirrors in car. Endurance has been about the same since the pacemaker was placed.   Knows he has to loose weight but only way he has had success is not eating.   Currently has heart failure and aortic aneurysm of 10cm. Denies any cardiac precautions.      Reports at 78 y.o. he was [place] equipment [place] and had nerve and tissue damage reports, foot almost torn off, heel was completely gone. 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   Did have bilateral shoulder injury as well     Social History:  Lives with adult daughter  Car    Home Environment:  3 level home   Living in basement with bedroom and bathroom on basement level.   Tub shower   Comfort height commode     DME Owned:  Shower chair     Patient presents for physical therapy today alone.     Level of function:    Prior level of function  Level of function at eval     Chronic  Difficult with showering and reaching body  Tripping regularly   5-10 min walk into medical buildings causing exhaustion  Unloading dishwasher is tiring   Washing clothing is exhausting      Patient Goal: be able to walk confidently around neighborhood (including hills 45 min), be able to walk from parking lot to children's athletic events -- 5-10 min with altering terrain    Pain:    Patient denies pain today.    MENTAL HEALTH SCREEN:  PHQ-9 is a self-report tool to screen, diagnose, and monitor depression severity. It consists of nine questions with scores ranging from 0 to 3, and a total score of 0 to 27.    Results:  PHQ Total Score: 0  Action taken:  Referral to behavioral health services not indicated      Interpretation of Total Score   1-4 Minimal depression    5-9 Mild depression    10-14 Moderate depression    15-19 Moderately severe depression    20-27 Severe depression      PHQ9 Copyright  [employer]. All rights reserved. Reproduced with permission. [employer]  is a Designer, multimedia    REVIEW OF SYSTEMS:    Vital Signs:  Seated Vital Signs:  left upper extremity:  Blood Pressure:  133/86  Pulse: 69    BP following   Blood Pressure: 159/90   Pulse: 74  - pt reporting L sided rib cage pain     Reassess immediately   Blood Pressure: 167/92 HR 69    Follow 3 min rest   Blood Pressure: 145/94  Pulse: 69   (RESOLVE OF CHEST PAIN)    Ending session  Blood Pressure: 140/90   Pulse: 69    Communication:   Alert and oriented to person, place and time.  Emotional/Behavioral responses appear appropriate at this time.  Demonstrates ability to make needs known.    Integumentary:  Unremarkable.  Musculoskeletal System:    ROM - INITIAL EVAL    Gross Range of Motion: WFL        STRENGTH - INITIAL EVAL    UE's grossly 4+/5        B LE MMT Right Left   Hip Flexion 3+/5 3+/5   Knee Extension 4+/5 4+/5   Knee Flexion 4+/5 4+/5     Neuromuscular System:  Sensation:    Light touch impaired in medial ankle into toes bilaterally R>L.    Tone:     0    Modified Ashworth Scale (MAS)  Score Description   0 No increase in muscle tone   1 Slight increase in muscle tone, manifested by a catch and release or by minimal resistance at the end of the range of motion when the affected part(s) is moved in flexion or extension   1+ Slight increase in muscle tone, manifested by a catch, followed by minimal resistance throughout the remainder (less than half) of the ROM   2 More marked increase in muscle tone through most of the ROM, but affected part(s) easily moved   3 Considerable increase in muscle tone, passive movement difficult   4 Affected part(s) rigid in flexion or extension    Bohannon and Smith, 1987    Coordination:  Finger to nose normal bilaterally  Finger opposition normal bilaterally  Heel to shin normal bilaterally  Test for rapid alternating movements of UE abnormal  Test for rapid alternating movements of LE normal      Mobility  Initial Eval   Functional Mobility and transfers:     Supine to sit: able to complete without UE  Sit to/from stand:  able to complete without UE   Balance:      Not formally assessed   Ambulation:  Without AD, independent   Gait Analysis:  Decreased toe clearance, bilateral ER, short stride length   Stairs:  Not formally assessed     Outcome Measures Initial Eval   Activities-Specific Balance Confidence Scale (ABC)  68.75 % (50-80% moderate level)   Functional Gait Assessment (FGA)   To be assessed next visit   Gait speed  ( )   Comfortable: 1.10  Fast: 1.39   5 times Sit to Stand Time in Seconds to Complete: 15.74 seconds (RPE 9)   6 Minute Walk Test ( )   To be assessed next visit        10 Meter Walk Test:  Measure and mark the start and end point of a 10-m walkway.  Add a mark at 2 m and 8 m (identifying the central 6 m which will be timed).  The total time taken to ambulate 6 meters (m) is recorded to the nearest hundredth of a second. 6  m is then divided by the total time (in seconds) taken to ambulate and recorded in m/s.    Gait type Trial 1 time (seconds) Trial 2 time (seconds) Avg time (seconds) Gait speed   (m/s) Notes   (AD, assist, etc)   Comfortable speed Time in Seconds for Trial 1: 9.01 seconds Time in Seconds for Trial 2: 9.14 seconds 9.075 1.10 Assistive Device: none,     Maximal speed 6.75 7.64 7.195 1.39      Interpretation:   []  Less than 0.4 m/s: Household ambulator  []  0.4 to 0.8 m/s:  Limited community ambulator  [x]  0.8 to 1.2 m/s:  [employer]   []  >1.2 m/s:  Able to safely cross streets, extremely  fit, climb many stairs.  []  < 0.7 m/s:  Increased risk of death, hospitalization, and falls  []  <1.0 m/s: May indicate increased fall risk  []  0.7 to 1.0 m/s:  Cognitive decline in five years  []  >0.9 m/s:  Able to do household activities  [] >1.0 m/s:  Carry groceries and light yard work  []  >1.0 m/s:  Less likely to be hospitalized or have adverse event  Gustav RAMAN, Lusardi M. White paper: "walking speed: The sixth vital sign." J Geriatr Phys Ther. 2009.     Patient []  is [x]  is not consistent with age/gender norms:  1.33 m/s:  average comfortable speed for males, 70-79 yo  2.07 m/s: average maximum gait speed for males, 68-79 yo  Bohannon RW. Comfortable and maximum walking speed of adults aged 20-79 years: Reference values and  determinants. Age Ageing. 63.      5 Times Sit to Stand (5TSTS):  Instruct patient to sit in chair with arms folded across their chest and with back against chair.    Instruct patient I want you to stand up and sit down five times in a row, as quickly as you can, when I say 'GO.'  Be sure to stand up fully and try not to let your back touch the chair back between each repetition.  Do no use the back of your legs against the chair.    Score:    5 Times Sit to Stand Test  Safe to Stand 5 Times?: Yes  Time in Seconds to Complete: 15.74 seconds (RPE 9)  (A score of 0 is typically given when patient is  unable to perform repetitions without the use of arms, unless noted otherwise)    Interpretation:   Normative Values by Age Category (Healthy Population):  70 - 79 years:  9.3 seconds (+/- 2.1 seconds)  Community dwelling older adults:  Cutoff scores: >15 seconds= risk of fall   Journal of Neurologic Physical Therapy, 2018; 42(2):174-220.    INTERVENTION:  EVALUATION    Patient Education:   Patient was educated on goals and benefits of therapy, as well as  on s/s of cardiac event. Education on current BP findings and concerns for BP findings .    Pt. did demonstrate an understanding of this education.  Home Exercise Program was not addressed today.SABRA    EVALUATION AND DIAGNOSIS:   Jason Bullock is a 78 y.o. male presenting to physical therapy with primary diagnosis of balance and gait instability. Pt with co-morbidities including sig cardiac history (see above and below) limiting patient progression within therapy. Pt did have episode of chest pain following assessment. Vitals elevated but remained stable. Pt symptoms resolved following rest. Pt reporting he is on anti-coagulants and angina medication. He will benefit from skilled PT services to decrease risk of falls and assist with establishing safe routine to encourage improved endurance and enhance quality of life to decrease health care burden.     Patient is unable to participate in His role(s) as a(n) community member.  Activity limitations include inability to walk community distances  with increasing challenge to bathing, dressing and completing household chores without sig endurance deficit and increased balance deficit.  These activity limitations are due to impairments in body structure and function that include impaired balance, impaired gait, motion intolerance, and Impaired postural reactions.  Patient requires skilled outpatient physical therapy to address these impairments and return to His desired activities and  roles.    PROGNOSIS:  Co-morbidities may affect  progress:  HBP / CAD  Pacemaker  On anti-coagulant  A-fib  Known aortic aneurysm  Heart failure     Goals:     Short Term Goals:  To be met by 07/05/2024 Goal Status:    PT will complete and FGA (or similar balance assessment) to be able to determine fall risk and limitations in endurance  New   2. Pt will demonstrate increased gait speed to > or = 1.2 m/sec in order to demonstrate progress towards safe community ambulation with out AD. New   3.  Patient will demonstrate progress toward reduction in fall risk as indicated by increased ABC score of > or = 75% for safe, independent, and confident mobility at home and in the community. New   4.  Patient will demonstrate progress toward reduction in fall risk and increase in functional independence as indicated by 5STS time of < or = 12 seconds. New   Long Term Goals:  To be met by 08/21/2024    5. Patient will be able to report established walking routine to demonstrate good progression towards improved endurance 3-5x per week.  New   6.  Patient will demonstrate improved to > or = 1721 ft to indicate improved efficiency, safety, and independence with household and community ambulation. New   7.  Patient will demonstrate reduction in fall risk as indicated by increased ABC score of > or = 80 for safe, independent, and confident mobility at home and in the community. New   8.  Patient will demonstrate reduction in fall risk and increase in functional independence as indicated by 5STS time of < or = 9 seconds. NEW         PLAN:  97530 Therapeutic Activities, to include functional mobility training.  02889 Therapeutic Exercises, to include home exercise program.  [phone] Neuromuscular Re-education, to include balance training.  028883 Gait Training  02859 Manual Therapy  Modalities: 97032 Attended Estim - NMES, FES 97014 Unattended Estim  [id number] Ultrasound  [phone] Wheelchair Management and Training  02239  Orthotic/Prosthetic Management and Training  Animal Assisted Intervention with Facility Dog  Patient/Caregiver Education and Training    Frequency of treatment:    2 times per week for 12 weeks.    It has been a pleasure to evaluate Jason Bullock.  Please contact me with any questions or concerns regarding this patient's evaluation or ongoing therapy.     Therapist Signature:      Rosealee Ades PT, DPT, CDNS  Doctor of Physical Therapy  [employer] Certified Dry Needling Specialist     05/24/2024      CPT Evaluation Code Justification  CRITERIA JUSTIFICATION DESCRIPTION   Personal factors and/or co-morbidities that impact the plan of care. Factors that affect plan of care include:  HBP / CAD  Pacemaker  On anti-coagulant  A-fib  Known aortic aneurysm  Heart failure  3 (High Complexity)     Body Systems Examined Impairments noted in:  Musculoskeletal  Neuromuscular  Cardiovascular  Pulmonary  Body structures/regions 4 or more elements (High Complexity)      Clinical Presentation Presentation varies throughout session, days. Evolving (Moderate Complexity)     Clinical Decision Making Standardized Assessment used:  See below for details. Evaluation Code:  Moderate Complexity       Note:  Please pardon any potential grammatical errors or typos as aspects of this note may have been created through speech-to-text software.         [1]  No Known Allergies

## 2024-05-26 ENCOUNTER — Ambulatory Visit

## 2024-05-26 DIAGNOSIS — R262 Difficulty in walking, not elsewhere classified: Secondary | ICD-10-CM

## 2024-05-26 DIAGNOSIS — R2689 Other abnormalities of gait and mobility: Secondary | ICD-10-CM

## 2024-05-26 NOTE — Progress Notes (Signed)
 [facility]  [address]  [place], [place]  [address]  Phone:  [phone]  Fax:  [phone]    PHYSICAL THERAPY DAILY TREATMENT NOTE    PATIENT: Jason Bullock DOB: [birth year]   MR #: [id number]  AGE: 78 y.o.    FACILITY PROVIDER #: [phone] PRIMARY MD: Pcp, None, MD    HICN# Medicare Sub. Num: [id number] DIAGNOSES: Long term (current) use of anticoagulants [Z79.01]      Date of Service PT Received On: 05/26/24   Treatment Time Start Time: 0902 to Stop Time: 1005   Time Calculation Time Calculation (min): 63 min   Visit # PT Visit  PT Visit Number: 2  PT- Certification Start Date: 05/24/24  PT- Certification End Date: 08/21/24   Units Billed   Therapeutic Interventions  $ PT Therapeutic Activity (97530): 4 units (63 mins)     Jason Bullock referred for physical therapy services by: Mardy Dicker, MD    Certification period, precautions, medications and allergies and goals copied from initial evaluation - reviewed and reconciled today.  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     Patient reports taking the following medications at this time:  See scanned chart     Precautions:   Fall Risk - Patient/caregiver instructed in fall prevention strategies and recommendations.  HBP / CAD  Pacemaker  On anti-coagulant  A-fib  Known aortic aneurysm  Heart failure      Allergies:  [Allergies]    [Allergies]  No Known Allergies     EXAMINATION / EVALUATION:     Past Medical  History:  Jason Bullock  has a past medical history of Atrial fibrillation.     Past Surgical History:  Jason Bullock  has a past surgical history that includes Ablation of dysrhythmic focus.       History of present illness:  Date of onset: 5 years ago  Jason Bullock is a 78 y.o. male reporting he is having a hard time keeping his endurance. Gets tired with unloading the dishwasher. Then he gets very clumsy. Has a pacemaker in for Afib approximately 2 years ago. They told him he would feel better but he does not feel any better. Endurance has decreased in last 5 years. Reports he did have fall prior to pace maker and was walking home from restaurant and tripped and could not slow himself down falling tot he ground. This really worried him. Has not had any falls since then but has had tripping and has to pay close attention to his movement.   Starting to see a difference in his peripheral awareness about what is going on around him   Has stiffness in spine and turning over is difficulty. Always has to check mirrors in car. Endurance has been about the same since the pacemaker was placed.   Knows he has to loose weight but only way he has had success is not eating.   Currently has heart failure and aortic aneurysm of 10cm. Denies any cardiac precautions.       Reports at 78  y.o. he was [place] and had nerve and tissue damage reports, foot almost torn off, heel was completely gone. 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   Did have bilateral shoulder injury as well      Social History:  Lives with adult daughter  Car     Home Environment:  3 level home   Living in basement with bedroom and bathroom on basement level.   Tub shower   Comfort height commode      DME Owned:  Shower chair      Patient presents for physical therapy today alone.      Level of function:    Prior level of function Level of function at  eval     Chronic  Difficult with showering and reaching body  Tripping regularly   5-10 min walk into medical buildings causing exhaustion  Unloading dishwasher is tiring   Washing clothing is exhausting       Patient Goal: be able to walk confidently around neighborhood (including hills 45 min), be able to walk from parking lot to children's athletic events -- 5-10 min with altering terrain    Allergies:  Allergies[1]    Working toward goals:  Goals:     Short Term Goals:  To be met by 07/05/2024 Goal Status:    PT will complete and FGA (or similar balance assessment) to be able to determine fall risk and limitations in endurance  New   2. Pt will demonstrate increased gait speed to > or = 1.2 m/sec in order to demonstrate progress towards safe community ambulation with out AD. New   3.  Patient will demonstrate progress toward reduction in fall risk as indicated by increased ABC score of > or = 75% for safe, independent, and confident mobility at home and in the community. New   4.  Patient will demonstrate progress toward reduction in fall risk and increase in functional independence as indicated by 5STS time of < or = 12 seconds. New   Long Term Goals:  To be met by 08/21/2024     5. Patient will be able to report established walking routine to demonstrate good progression towards improved endurance 3-5x per week.  New   6.  Patient will demonstrate improved to > or = 1721 ft to indicate improved efficiency, safety, and independence with household and community ambulation. New   7.  Patient will demonstrate reduction in fall risk as indicated by increased ABC score of > or = 80 for safe, independent, and confident mobility at home and in the community. New   8.  Patient will demonstrate reduction in fall risk and increase in functional independence as indicated by 5STS time of < or = 9 seconds. NEW         EXAMINATION & CLINICAL FINDINGS:  Subjective Report:    Patient reports some congestion today.      Patient presents for physical therapy today alone.     Observations: Independently ambulated into therapy gym, no AD    Pain:    Denied pain today.   A sensation where pacemaker is.  A light sunburn type of pain.     Outcomes assessed:   Outcome Measures Initial Eval   Activities-Specific Balance Confidence Scale (ABC)  68.75 % (50-80% moderate level)   Functional Gait Assessment (FGA)   To be assessed  next visit   Gait speed  ( )    Comfortable: 1.10  Fast: 1.39   5 times Sit to Stand Time in Seconds to Complete: 15.74 seconds (RPE 9)   6 Minute Walk Test ( )   To be assessed next visit        Heart Rate Max Calculation:  Age:  78 yo  Beta-Blockers:  []  yes [x]  no  % of HR max HR (bpm)   65% 100   75% 116   85% 131   HR max 154   Polar App HR max 144        INTERVENTION:  Treatment Performed:    Vitals:   Pre-Session: 105/ 80 mmHg, 125 bpm  Post Session: 131/ 85 mmHg, 89 bpm    6 Minute Walk Test:  6 Minute Walk Test  Total time ambulated: 6 minutes  Total distance ambulated: 1199 ft, 365.4  Number of standing rest breaks: 0  BORG scale at rest: 6  BORG scale with activity: 10/20   Assistive devices or braces used: none  Assist required to complete test: Supervision  Peak HR:  128 nbpm     Average score:  []  471 - 636 meters depending on age and gender for adults up to 78 yo  []  392 - 417 meters depending on age and gender for adults 57-89 yo  []  575-728 meters estimated for patient's specific age/gender/height/weight*     Intepretation:  Higher scores indicate farther distances.  Patient []  is [x]  is not consistent with age and gender norms.  Patient achieved 69.67% of expected distance for age/gender/height/weight*     *https://[email or web address].com/health/6-minute-walk-test     Enrichi PL, Sherrill DL. Reference equations for the six-minute walk in healthy adults. Am J Respir Crit Care Med.  1998. doi:10.1164/ajrccm.161.4.16147a     Sunnie YEHUDA Ferns TA, Mollinger L. Age- and gender-related test  performance in community-dwelling elderly  people: Six-Minute Walk Test, Berg Balance Scale, Timed Up & Go Test, and gait speeds. Phys Ther. 2002.  doi:10.1093/ptj/82.2.128       Functional Gait Assessment  :  Requirements: A marked 6-m (20-ft) walkway that is marked with a 30.48-cm (12-in) width.    1. GAIT LEVEL SURFACE   Instructions: Walk at your normal speed from here to the next mark (6 m[20 ft]).    (3) Normal-Walks 6 m (20 ft) in less than 5.5 seconds, no assistive devices, good speed, no evidence for imbalance, normal gait pattern, deviates no more than 15.24 cm (6 in) outside of the 30.48-cm (12-in) walkway width.      2. CHANGE IN GAIT SPEED   Instructions: Begin walking at your normal pace (for 1.5 m [5 ft]). When I tell you "go," walk as fast as you can (for 1.5 m [5 ft]). When I tell you "slow," walk as slowly as you can (for 1.5 m [5 ft]).   (3) Normal-Able to smoothly change walking speed without loss of balance or gait deviation. Shows a significant difference in walking speeds between normal, fast, and slow speeds. Deviates no more than 15.24 cm (6 in) outside of the 30.48-cm (12-in) walkway width.        3. GAIT WITH HORIZONTAL HEAD TURNS   Instructions: Walk from here to the next mark 6 m (20 ft) away. Begin walking at your normal pace. Keep walking straight; after 3 steps, turn your head to the right and keep walking straight while looking to the right. After 3 more steps, turn your head  to the left and keep walking straight while looking left. Continue alternating looking right and left very 3 steps until you have completed 2 repetitions in each direction.   (3) Normal-Performs head turns smoothly with no change in gait.  Deviates no more than 15.24 cm (6 in) outside 30.48-cm (12-in) walkway width.        4. GAIT WITH VERTICAL HEAD TURNS   Instructions: Walk from here to the next mark (6 m [20 ft]). Begin walking at your normal pace. Keep walking straight; after 3 steps, tip your head up and  keep walking straight while looking up. After 3 more steps, tip your head down, keep walking straight while looking down. Continue alternating looking up and down every 3 steps until you have completed 2 repetitions in each direction.   (3) Normal-Performs head turns with no change in gait. Deviates no more than 15.24 cm (6 in) outside 30.48-cm (12-in) walkway width.          5. GAIT AND PIVOT TURN   Instructions: Begin with walking at your normal pace. When I tell you, "turn and stop," turn as quickly as you can to face the opposite direction and stop.   (3) Normal-Pivot turns safely within 3 seconds and stops quickly with no loss of balance.      6. STEP OVER OBSTACLE   Instructions: Begin walking at your normal speed. When you come to the shoe box, step over it, not around it, and keep walking.   (3) Normal-Is able to step over 2 stacked shoe boxes taped together (22.86 cm [9 in] total height) without changing gait speed; no evidence of imbalance.       7. GAIT WITH NARROW BASE OF SUPPORT   Instructions: Walk on the floor with arms folded across the chest, feet aligned heel to toe in tandem for a distance of 3.6 m [12 ft]. The number of steps taken in a straight line are counted for a maximum of 10 steps.   (1) Moderate impairment-Ambulates 4-7 steps.        8. GAIT WITH EYES CLOSED   Instructions: Walk at your normal speed from here to the next mark (6 m [20 ft]) with your eyes closed.   3-Normal: Walks 30 ft, no assistive devices, good speed, no evidence of imbalance, normal giat pattern, deviates no more than 6 in outside 12-inwalkway width. Ambulates 20 ft in less than 7 seconds.       9. AMBULATING BACKWARDS   Instructions: Walk backwards until I tell you to stop.   2-Mild impairments: Walks 20 ft, uses assitive device, slower speed, mild gait deviations, deviates 6-10 in outside 12-in walkway width. Ambulates 20 ft in less than 9 seconds, but greater than 7 seconds.     10. STEPS   Instructions: Walk up  these stairs as you would at home (ie, using the rail if necessary). At the top turn around and walk down.   3-Normal: Alternating feet, no rail.       TOTAL SCORE:  27/30          Interpretation:    [x]  WNL for adults 84-69 yo:  27.1-28.9/30 (depending on age and gender)   []  WNL for adults 6-89 yo:  20.8-24.9/30  (depending on age and gender)  []  < 22/30 may indicate fall risk     Walker ML, Austin AG, Banke GM, et al. Reference Group Data for the Functional Gait Assessment. Phys Ther. 2007. doi:10.2522/ptj.[email or web address]  Willye DM, Kumar NA. Functional Gait Assessment: concurrent, discriminitive, and predictive validity in community-dwelling older adults. Phys Ther. 2010. Doi:10.2522/ptj.20090069       Patient Education:   Patient was educated on goals and benefits of therapy, as well as  on s/s of cardiac event. Education on current BP findings and concerns for BP findings .    Pt. did demonstrate an understanding of this education.  Home Exercise Program was not addressed today.SABRA    PHYSICAL THERAPY EVALUATION  Response to treatment: Patient seen for first follow-up post initial Neuro Pt evaluation. Reminder of Outcome testing performed with scores of FGA within functional limits for age norms but decreased distance with 6 min walk test ( partially busy environment but pt was Short of breath). Pt did have difficulty with tandem walking and performing more difficult balance strategies. Dorsiflexion assessed due to pt reporting weak ankles but appeared strong. Pt had high HR initially in session of 125 bpm then decreased post 6 min walk test. Education on performing more waking and upright activities at home to assist with increase in endurance and activity tolerance . Patient presents with cardiac history and decreased activity tolerance and endurance and would benefit from Cardiac Rehab. Communication between Evaluating therapist and Cardiac Rehab has been established and pt is to request a script for Cardiac  Rehab by [facility]. Patient could still benefit from OP Rehab for static and dynamic balance strategies and endurance until Cardiac Rehab arrangements has been made.     Impairments and functional limitations:  Patient is unable to participate in His role(s) as a(n) community member.  Activity limitations include inability to walk community distances  with increasing challenge to bathing, dressing and completing household chores without sig endurance deficit and increased balance deficit.  These activity limitations are due to impairments in body structure and function that include impaired balance, impaired gait, motion intolerance, and Impaired postural reactions.  Patient requires skilled outpatient physical therapy to address these impairments and return to His desired activities and roles.     PLAN:  Patient will require continued skilled intervention to address above stated impairments and activity limitations.    Continue with established plan of care with focus on Dynamic Balance, NBOS activities, Reaching above head, and endurance for upcoming visits.    Therapist Signature:      05/26/2024    Note:  Please pardon any potential grammatical errors or typos as aspects of this note may have been created through speech-to-text software.           [1] No Known Allergies

## 2024-05-31 ENCOUNTER — Ambulatory Visit: Admitting: Physical Therapist

## 2024-05-31 DIAGNOSIS — R279 Unspecified lack of coordination: Secondary | ICD-10-CM

## 2024-05-31 DIAGNOSIS — M6281 Muscle weakness (generalized): Secondary | ICD-10-CM

## 2024-05-31 DIAGNOSIS — R262 Difficulty in walking, not elsewhere classified: Secondary | ICD-10-CM

## 2024-05-31 DIAGNOSIS — R2689 Other abnormalities of gait and mobility: Secondary | ICD-10-CM

## 2024-05-31 DIAGNOSIS — G5793 Unspecified mononeuropathy of bilateral lower limbs: Secondary | ICD-10-CM

## 2024-05-31 NOTE — Progress Notes (Signed)
 [facility]  [address]  [place], [place]  [address]  Phone:  [phone]  Fax:  [phone]    PHYSICAL THERAPY DAILY TREATMENT NOTE    PATIENT: Jason Bullock DOB: 1946/09/17   MR #: [id number]  AGE: 78 y.o.    FACILITY PROVIDER #: [id number] PRIMARY MD: Pcp, None, MD    HICN# Medicare Sub. Num: [id number] DIAGNOSES: Long term (current) use of anticoagulants [Z79.01]      Date of Service PT Received On: 05/31/24   Treatment Time Start Time: 1500 to Stop Time: 1555   Time Calculation Time Calculation (min): 55 min   Visit # PT Visit  PT Visit Number: 3  PT- Certification Start Date: 05/24/24  PT- Certification End Date: 08/21/24   Units Billed   Therapeutic Interventions  $ PT Therapeutic Exercise (97110): 3 Units ( )  $ PT Therapeutic Activity (97530): 1 unit ( )     Jason Bullock referred for physical therapy services by: Jason Dicker, MD    Certification period, precautions, medications and allergies and goals copied from initial evaluation - reviewed and reconciled today.  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     Patient reports taking the following medications at this time:  See scanned chart     Precautions:   Fall Risk - Patient/caregiver instructed in fall prevention strategies and recommendations.  HBP / CAD  Pacemaker  On anti-coagulant  A-fib  Known aortic aneurysm  Heart failure      Allergies:  [Allergies]    [Allergies]  No Known Allergies      EXAMINATION / EVALUATION:     Past Medical History:  Jason Bullock  has a past medical history of Atrial fibrillation.     Past Surgical History:  Jason Bullock  has a past surgical history that includes Ablation of dysrhythmic focus.       History of present illness:  Date of onset: 5 years ago  Jason Bullock is a 78 y.o. male reporting he is having a hard time keeping his endurance. Gets tired with unloading the dishwasher. Then he gets very clumsy. Has a pacemaker in for Afib approximately 2 years ago. They told him he would feel better but he does not feel any better. Endurance has decreased in last 5 years. Reports he did have fall prior to pace maker and was walking home from restaurant and tripped and could not slow himself down falling tot he ground. This really worried him. Has not had any falls since then but has had tripping and has to pay close attention to his movement.   Starting to see a difference in his peripheral awareness about what is going on around him   Has stiffness in spine and turning over is difficulty. Always has to check mirrors in car. Endurance has been about the same since the pacemaker was placed.   Knows he has to loose weight but only way he has had success is not eating.   Currently has heart failure and aortic aneurysm of 10cm. Denies any cardiac precautions.  Reports at 78 y.o. he was [place] equipment [place] and had nerve and tissue damage reports, foot almost torn off, heel was completely gone. 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   Did have bilateral shoulder injury as well      Social History:  Lives with adult daughter  Car     Home Environment:  3 level home   Living in basement with bedroom and bathroom on basement level.   Tub shower   Comfort height commode      DME Owned:  Shower chair      Patient presents for physical therapy today alone.      Level of function:     Prior level of function Level of function at eval     Chronic  Difficult with showering and reaching body  Tripping regularly   5-10 min walk into medical buildings causing exhaustion  Unloading dishwasher is tiring   Washing clothing is exhausting       Patient Goal: be able to walk confidently around neighborhood (including hills 45 min), be able to walk from parking lot to children's athletic events -- 5-10 min with altering terrain    Allergies:  Allergies[1]    Working toward goals:  Goals:     Short Term Goals:  To be met by 07/05/2024 Goal Status:    PT will complete and FGA (or similar balance assessment) to be able to determine fall risk and limitations in endurance  New   2. Pt will demonstrate increased gait speed to > or = 1.2 m/sec in order to demonstrate progress towards safe community ambulation with out AD. New   3.  Patient will demonstrate progress toward reduction in fall risk as indicated by increased ABC score of > or = 75% for safe, independent, and confident mobility at home and in the community. New   4.  Patient will demonstrate progress toward reduction in fall risk and increase in functional independence as indicated by 5STS time of < or = 12 seconds. New   Long Term Goals:  To be met by 08/21/2024     5. Patient will be able to report established walking routine to demonstrate good progression towards improved endurance 3-5x per week.  New   6.  Patient will demonstrate improved to > or = 1721 ft to indicate improved efficiency, safety, and independence with household and community ambulation. New   7.  Patient will demonstrate reduction in fall risk as indicated by increased ABC score of > or = 80 for safe, independent, and confident mobility at home and in the community. New   8.  Patient will demonstrate reduction in fall risk and increase in functional independence as indicated by 5STS time of < or = 9 seconds. NEW         EXAMINATION & CLINICAL FINDINGS:  Subjective Report:     Patient reports some congestion today.     Patient presents for physical therapy today alone.     Observations: Independently ambulated into therapy gym, no AD    Pain:    Denied pain today.   A sensation where pacemaker is.  A light sunburn type of pain.     Outcomes assessed:   Outcome Measures Initial Eval 7/11   Activities-Specific Balance Confidence Scale (ABC)  68.75 % (50-80% moderate level)    Functional Gait Assessment (FGA)  To be assessed next visit 27/30   Gait speed  ( )    Comfortable: 1.10  Fast: 1.39    5 times Sit to Stand Time in Seconds to Complete: 15.74 seconds (RPE 9)    6 Minute Walk Test ( )   To be assessed next visit  1157ft       Heart Rate Max Calculation:  Age:  78 yo  Beta-Blockers:  []  yes [x]  no  % of HR max HR (bpm)   65% 100   75% 116   85% 131   HR max 154   Polar App HR max 144        INTERVENTION:  Treatment Performed:    Vitals:  BP 132/89  HR 91 BPM    Post Nustep  167/89  HR 105     POST TE      THERAPEUTIC EXERCISE  1:1 per flow sheet in order to increase ROM, increase strength. Justified to address any of the following:  To develop strength, endurance, ROM and/or flexibility. Provided instruction in proper form and position to safely perform exercise, verbal cues for postural correction, verbal cues to breathe through exercise, instruction to keep ROM pain free   Nustep for warm up  3 min lvl 4 SPM 70 RPE 7  4 min lvl 6 SPM 70 RPE 11  3 min lvl 7 SPM 70 RPE 13    STS with SPV x10  - 10lb tidal ball 2x8  RPE 15     Shoulder abduction RTB 2x15     Seated hip abduction RTB x15  - progressed to BTB 2x15 RPE 15     THERAPEUTIC ACTIVITY  Justified to address the following:  Dynamic activities to improve functional performance  Reviewed HEP listed below and educated on walking program  Patient educated on graded walking program to promote cardiovascular endurance, increased LE strength, and improve WB tolerance. Patient given written instructions   - week 1: 5 min  -  week  2: 10 min  - week 3 3: 15 min  - Day 4: 20 min  Cont to progress until desired amount is achieved  *flat ground  *decrease by 5 min if pain or fatigue is sig or increases       Patient Education:   Patient was educated on goals and benefits of therapy, as well as  on s/s of cardiac event. Education on current BP findings and concerns for BP findings .    Pt. did demonstrate an understanding of this education.  Home Exercise Program was not addressed today..    Access Code: [email or web address]  URL: [email or web address]  Date: 05/31/2024  Prepared by: Jason Bullock    Program Notes  WALKING PROGRAMstart with walking 5 min every day. After one week increase time by 5 min. Continue to progress until desired time is achieved. If you have pain decrease time by 5 min. See RPE scale try to maintain RPE between 12-15.Always do a WARM UP and COOL DOWN    Exercises  - Sit to Stand with Arms Crossed  - 1 x daily - 7 x weekly - 3 sets - 10 reps  - Standing Shoulder Horizontal Abduction with Resistance  - 1 x daily - 7 x weekly - 3 sets - 15 reps  - Seated Hip Abduction with Resistance  - 1 x daily - 7 x weekly - 3 sets - 15 reps    PHYSICAL THERAPY EVALUATION  Response to treatment: PT focused on  creation of generalized LE strengthening and development of endurance progression. Pt sig challenged with LE strengthening. Pt did have f/u with cardiovascular surgeon, denying referral for cardiac rehab and cleared to progress with endurance independently. PT stressed importance of adherence to HEP for progression of endurance concerns and fatigue levels causing poor balance. He will cont to benefit from skilled PT services to improve overall function and improve tolerance to ADLs with decreased endurance and decreased fall risk.     Impairments and functional limitations:  Patient is unable to participate in His role(s) as a(n) community member.  Activity limitations include inability to walk community distances  with  increasing challenge to bathing, dressing and completing household chores without sig endurance deficit and increased balance deficit.  These activity limitations are due to impairments in body structure and function that include impaired balance, impaired gait, motion intolerance, and Impaired postural reactions.  Patient requires skilled outpatient physical therapy to address these impairments and return to His desired activities and roles.     PLAN:  Patient will require continued skilled intervention to address above stated impairments and activity limitations.    Continue with established plan of care with focus on Dynamic Balance, NBOS activities, Reaching above head, and endurance for upcoming visits.    Therapist Signature:      Jason Bullock PT, Jason Bullock, CDNS  Doctor of Physical Therapy  [employer] Certified [employer] Specialist     05/31/2024    Note:  Please pardon any potential grammatical errors or typos as aspects of this note may have been created through speech-to-text software.           [1] No Known Allergies

## 2024-06-02 ENCOUNTER — Ambulatory Visit

## 2024-06-02 DIAGNOSIS — R262 Difficulty in walking, not elsewhere classified: Secondary | ICD-10-CM

## 2024-06-02 DIAGNOSIS — R2689 Other abnormalities of gait and mobility: Secondary | ICD-10-CM

## 2024-06-02 NOTE — Progress Notes (Signed)
 [facility]  [address], [address]  [place], [place]  [address]  Phone:  [phone]  Fax:  [phone]    PHYSICAL THERAPY DAILY TREATMENT NOTE    PATIENT: Jason Bullock DOB: [birth year]   MR #: [id number]  AGE: 78 y.o.    FACILITY PROVIDER #: [phone] PRIMARY MD: Pcp, None, MD    HICN# Medicare Sub. Num: [id number] DIAGNOSES: Long term (current) use of anticoagulants [Z79.01]      Date of Service PT Received On: 06/02/24   Treatment Time Start Time: 1010 to Stop Time: 1105   Time Calculation Time Calculation (min): 55 min   Visit # PT Visit  PT Visit Number: 4  PT- Certification Start Date: 05/24/24  PT- Certification End Date: 08/21/24   Units Billed   Therapeutic Interventions  $ PT Therapeutic Activity (97530): 4 units (55 mins)     Jason Bullock referred for physical therapy services by: Jason Dicker, MD    Certification period, precautions, medications and allergies and goals copied from initial evaluation - reviewed and reconciled today.  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     Patient reports taking the following medications at this time:  See scanned chart     Precautions:   Fall Risk - Patient/caregiver instructed in fall prevention strategies and recommendations.  HBP / CAD  Pacemaker  On anti-coagulant  A-fib  Known aortic aneurysm  Heart failure      Allergies:  [Allergies]    [Allergies]  No Known Allergies     EXAMINATION / EVALUATION:     Past Medical  History:  Jason Bullock  has a past medical history of Atrial fibrillation.     Past Surgical History:  Iosefa Bullock  has a past surgical history that includes Ablation of dysrhythmic focus.       History of present illness:  Date of onset: 5 years ago  Jason Bullock is a 78 y.o. male reporting he is having a hard time keeping his endurance. Gets tired with unloading the dishwasher. Then he gets very clumsy. Has a pacemaker in for Afib approximately 2 years ago. They told him he would feel better but he does not feel any better. Endurance has decreased in last 5 years. Reports he did have fall prior to pace maker and was walking home from restaurant and tripped and could not slow himself down falling tot he ground. This really worried him. Has not had any falls since then but has had tripping and has to pay close attention to his movement.   Starting to see a difference in his peripheral awareness about what is going on around him   Has stiffness in spine and turning over is difficulty. Always has to check mirrors in car. Endurance has been about the same since the pacemaker was placed.   Knows he has to loose weight but only way he has had success is not eating.   Currently has heart failure and aortic aneurysm of 10cm. Denies any cardiac precautions.       Reports at 78  y.o. he was caught in equipment [place] farm and had nerve and tissue damage reports, foot almost torn off, heel was completely gone. 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   Did have bilateral shoulder injury as well      Social History:  Lives with adult daughter  Car     Home Environment:  3 level home   Living in basement with bedroom and bathroom on basement level.   Tub shower   Comfort height commode      DME Owned:  Shower chair      Patient presents for physical therapy today alone.      Level of function:    Prior level of function Level of function at  eval     Chronic  Difficult with showering and reaching body  Tripping regularly   5-10 min walk into medical buildings causing exhaustion  Unloading dishwasher is tiring   Washing clothing is exhausting       Patient Goal: be able to walk confidently around neighborhood (including hills 45 min), be able to walk from parking lot to children's athletic events -- 5-10 min with altering terrain    Allergies:  Allergies[1]    Working toward goals:  Goals:     Short Term Goals:  To be met by 07/05/2024 Goal Status:    PT will complete and FGA (or similar balance assessment) to be able to determine fall risk and limitations in endurance  New   2. Pt will demonstrate increased gait speed to > or = 1.2 m/sec in order to demonstrate progress towards safe community ambulation with out AD. New   3.  Patient will demonstrate progress toward reduction in fall risk as indicated by increased ABC score of > or = 75% for safe, independent, and confident mobility at home and in the community. New   4.  Patient will demonstrate progress toward reduction in fall risk and increase in functional independence as indicated by 5STS time of < or = 12 seconds. New   Long Term Goals:  To be met by 08/21/2024     5. Patient will be able to report established walking routine to demonstrate good progression towards improved endurance 3-5x per week.  New   6.  Patient will demonstrate improved to > or = 1721 ft to indicate improved efficiency, safety, and independence with household and community ambulation. New   7.  Patient will demonstrate reduction in fall risk as indicated by increased ABC score of > or = 80 for safe, independent, and confident mobility at home and in the community. New   8.  Patient will demonstrate reduction in fall risk and increase in functional independence as indicated by 5STS time of < or = 9 seconds. NEW         EXAMINATION & CLINICAL FINDINGS:  Subjective Report:    Patient reports some congestion today.      Patient presents for physical therapy today alone.     Observations: Independently ambulated into therapy gym, no AD    Pain:    Denied pain today.   A sensation where pacemaker is.  A light sunburn type of pain.     Outcomes assessed:   Outcome Measures Initial Eval 7/11   Activities-Specific Balance Confidence Scale (ABC)  68.75 % (50-80% moderate level)    Functional Gait Assessment (FGA)   To  be assessed next visit 27/30   Gait speed  ( )    Comfortable: 1.10  Fast: 1.39    5 times Sit to Stand Time in Seconds to Complete: 15.74 seconds (RPE 9)    6 Minute Walk Test ( )   To be assessed next visit  1131ft       Heart Rate Max Calculation:  Age:  78 yo  Beta-Blockers:  []  yes [x]  no  % of HR max HR (bpm)   65% 100   75% 116   85% 131   HR max 154   Polar App HR max 144        INTERVENTION:  Treatment Performed:    Vitals:  BP 133/ 90  HR  72 BPM    Post Nustep    HR BPM       Moderate to high intensity gait training performed with goal of attaining 65-85% of HR max, monitored continuously via Polar  HR monitor on Ue's  as noted below:    Description  Peak HR (bpm) RPE   Elliptical   x3, x2 mins at level 1 for  improved potential of reciprocal stepping pattern and increased upper body strength and ROM for overall strength, endurance and activity tolerance of All  extremities as well as cardiovascular exercises. Rest period required between bouts  121    Knee high toe taps on barrel   2x10, 5 lbs applied BLE 106    Sit to stands from 90/90 c Titdal sphere  2x10  5lbs BLE  90    Walking with Tidal Sphere 2x100 feet  - palms up, close to body for tank  -5 lbs BLE  120        Standing/seated therapeutic rest breaks for HR management and cardiopulmonary/muscular recovery as requested by patient and/or indicated by exceeding 85% of HRmax.     Balance:   Tandem Walking on Soft Balance beam  2x2 passes  Increased difficulty shown    Patient Education:   Reviewed HEP listed below and educated on walking  program  Patient educated on graded walking program to promote cardiovascular endurance, increased LE strength, and improve WB tolerance. Patient given written instructions   - week 1: 5 min  - week  2: 10 min  - week 3 3: 15 min  - Day 4: 20 min  Cont to progress until desired amount is achieved  *flat ground  *decrease by 5 min if pain or fatigue is sig or increases     Patient was educated on goals and benefits of therapy, as well as  on s/s of cardiac event. Education on current BP findings and concerns for BP findings .    Pt. did demonstrate an understanding of this education.  Home Exercise Program was not addressed today..    Access Code: [email or web address]  URL: [email or web address]  Date: 05/31/2024  Prepared by: Rosealee Ades    Program Notes  WALKING PROGRAMstart with walking 5 min every day. After one week increase time by 5 min. Continue to progress until desired time is achieved. If you have pain decrease time by 5 min. See RPE scale try to maintain RPE between 12-15.Always do a WARM UP and COOL DOWN    Exercises  - Sit to Stand with Arms Crossed  - 1 x daily - 7 x weekly - 3 sets - 10 reps  - Standing Shoulder Horizontal Abduction with Resistance  - 1 x daily - 7 x weekly - 3  sets - 15 reps  - Seated Hip Abduction with Resistance  - 1 x daily - 7 x weekly - 3 sets - 15 reps    PHYSICAL THERAPY EVALUATION  Response to treatment: Pt reported that he is walking in mornings now and attempting to do more activity at home. Activities with HR monitoring while performing all within proper zones without dropping performed for improved activity tolerance and endurance. Pt overall performed well but has decreased strength and muscular endurance in LE's at this time.  He will cont to benefit from skilled PT services to improve overall function and improve tolerance to ADLs with decreased endurance and decreased fall risk.     Impairments and functional limitations:  Patient is unable to participate in  His role(s) as a(n) community member.  Activity limitations include inability to walk community distances  with increasing challenge to bathing, dressing and completing household chores without sig endurance deficit and increased balance deficit.  These activity limitations are due to impairments in body structure and function that include impaired balance, impaired gait, motion intolerance, and Impaired postural reactions.  Patient requires skilled outpatient physical therapy to address these impairments and return to His desired activities and roles.     PLAN:  Patient will require continued skilled intervention to address above stated impairments and activity limitations.    Continue with established plan of care with focus on Dynamic Balance, NBOS activities, Reaching above head, and endurance for upcoming visits.    Therapist Signature:          06/02/2024    Note:  Please pardon any potential grammatical errors or typos as aspects of this note may have been created through speech-to-text software.           [1] No Known Allergies

## 2024-06-14 ENCOUNTER — Ambulatory Visit: Admitting: Physical Therapist

## 2024-06-14 DIAGNOSIS — M6281 Muscle weakness (generalized): Secondary | ICD-10-CM

## 2024-06-14 DIAGNOSIS — R279 Unspecified lack of coordination: Secondary | ICD-10-CM

## 2024-06-14 DIAGNOSIS — R2689 Other abnormalities of gait and mobility: Secondary | ICD-10-CM

## 2024-06-14 DIAGNOSIS — G5793 Unspecified mononeuropathy of bilateral lower limbs: Secondary | ICD-10-CM

## 2024-06-14 DIAGNOSIS — R262 Difficulty in walking, not elsewhere classified: Secondary | ICD-10-CM

## 2024-06-14 NOTE — Discharge Summary (Addendum)
 [facility]  [address], [address]  [place], [place]  [address]  Phone:  [phone]  Fax:  [phone]    PHYSICAL THERAPY DISCHARGE SUMMARY    PATIENT: Jason Bullock DOB: October 19, 1946   MR #: [id number]  AGE: 78 y.o.    FACILITY PROVIDER #: [phone] PRIMARY MD: Pcp, None, MD    HICN# Medicare Sub. Num: [id number] DIAGNOSES: Long term (current) use of anticoagulants [Z79.01]      Date of Service PT Received On: 06/14/24   Treatment Time Start Time: 1700 to Stop Time: 1745   Time Calculation Time Calculation (min): 45 min   Visit # PT Visit  PT Visit Number: 5  PT- Certification Start Date: 05/24/24  PT- Certification End Date: 08/21/24   Units Billed   Therapeutic Interventions  $ PT Therapeutic Activity (97530): 3 units ( )     [name] referred for physical therapy services by: Mardy Dicker, MD    Certification period, precautions, medications and allergies and goals copied from initial evaluation - reviewed and reconciled today.  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     Patient reports taking the following medications at this time:  See scanned chart     Precautions:   Fall Risk - Patient/caregiver instructed in fall prevention strategies and recommendations.  HBP / CAD  Pacemaker  On anti-coagulant  A-fib  Known aortic aneurysm  Heart failure      Allergies:  [Allergies]    [Allergies]  No Known Allergies     EXAMINATION / EVALUATION:     Past Medical History:  Jerauld Bostwick  has a past medical history of Atrial fibrillation.     Past Surgical History:  Chauncy Mangiaracina  has a past surgical history that includes Ablation of dysrhythmic focus.       History of present illness:  Date of onset: 5 years ago  Jason Bullock is a 78 y.o. male reporting he is having a hard time keeping his endurance. Gets tired with unloading the dishwasher. Then he gets very clumsy. Has a pacemaker in for Afib approximately 2 years ago. They told him he would feel better but he does not feel any better. Endurance has decreased in last 5 years. Reports he did have fall prior to pace maker and was walking home from restaurant and tripped and could not slow himself down falling tot he ground. This really worried him. Has not had any falls since then but has had tripping and has to pay close attention to his movement.   Starting to see a difference in his peripheral awareness about what is going on around him   Has stiffness in spine and turning over is difficulty. Always has to check mirrors in car. Endurance has been about the same since the pacemaker was placed.   Knows he has to loose weight but only way he has had success is not eating.   Currently has heart failure and aortic aneurysm of 10cm. Denies any cardiac precautions.       Reports at 78 y.o. 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   Did have bilateral shoulder injury as well      Social History:  Lives with adult daughter  Car     Home Environment:  3 level home   Living in basement with bedroom and bathroom on basement level.   Tub shower   Comfort height commode      DME Owned:  Shower chair      Patient presents for physical therapy today alone.      Level of function:    Prior level of function Level of function at eval     Chronic   Difficult with showering and reaching body  Tripping regularly   5-10 min walk into medical buildings causing exhaustion  Unloading dishwasher is tiring   Washing clothing is exhausting       Patient Goal: be able to walk confidently around neighborhood (including hills 45 min), be able to walk from parking lot to children's athletic events -- 5-10 min with altering terrain    Allergies:  Allergies[1]    Working toward goals:  Goals:     Short Term Goals:  To be met by 07/05/2024 Goal Status:    PT will complete and FGA (or similar balance assessment) to be able to determine fall risk and limitations in endurance  GOAL MET   2. Pt will demonstrate increased gait speed to > or = 1.2 m/sec in order to demonstrate progress towards safe community ambulation with out AD. GOAL NOT MET   3.  Patient will demonstrate progress toward reduction in fall risk as indicated by increased ABC score of > or = 75% for safe, independent, and confident mobility at home and in the community. REDUCED 7/30 EH   4.  Patient will demonstrate progress toward reduction in fall risk and increase in functional independence as indicated by 5STS time of < or = 12 seconds. GOAL NOT MET   Long Term Goals:  To be met by 08/21/2024     5. Patient will be able to report established walking routine to demonstrate good progression towards improved endurance 3-5x per week.  NOT MET 7/30   6.  Patient will demonstrate improved to > or = 1721 ft to indicate improved efficiency, safety, and independence with household and community ambulation. GOAL NOT ASSESSED DUE TO LIMITED ADHERENCE TO HEP and SELF [place]   7.  Patient will demonstrate reduction in fall risk as indicated by increased ABC score of > or = 80 for safe, independent, and confident mobility at home and in the community. NOT MET 7/30   8.  Patient will demonstrate reduction in fall risk and increase in functional independence as indicated by 5STS time of < or = 9 seconds. GOAL NOT MET           EXAMINATION & CLINICAL FINDINGS:  Subjective Report:    Patient reports he does not feel like he has the time to commit to PT. Also reports he is concerned about his cardiac stress test and also under a lot of stress. Has not been consistent with his exercises and also only walking to mail box everyday.     Patient presents for physical therapy today alone.     Observations: Independently ambulated  into therapy gym, no AD    Pain:    Denied pain today.   A sensation where pacemaker is.  A light sunburn type of pain.     Outcomes assessed:   Outcome Measures Initial Eval 7/11 7/30   Activities-Specific Balance Confidence Scale (ABC)  68.75 % (50-80% moderate level)  53.75 50% - 80% - Moderate level of physical functioning    Functional Gait Assessment (FGA)   To be assessed next visit 27/30 Not addressed today   Gait speed  ( )    Comfortable: 1.10  Fast: 1.39  Comfortable: 1.13  Fast: 1.5   5 times Sit to Stand Time in Seconds to Complete: 15.74 seconds (RPE 9)  14.78 seconds    6 Minute Walk Test ( )   To be assessed next visit  11108ft Not addressed today      5 Times Sit to Stand (5TSTS):  Instruct patient to sit in chair with arms folded across their chest and with back against chair.    Instruct patient I want you to stand up and sit down five times in a row, as quickly as you can, when I say 'GO.'  Be sure to stand up fully and try not to let your back touch the chair back between each repetition.  Do no use the back of your legs against the chair.    Score:  14.78 seconds   (A score of 0 is typically given when patient is unable to perform repetitions without the use of arms, unless noted otherwise)    Interpretation:   Community dwelling older adults:  Cutoff scores: >15 seconds= risk of fall   Journal of Neurologic Physical Therapy, 2018; 42(2):174-220.     10 Meter Walk Test:  Measure and mark the start and end point of a 10-m walkway.  Add a mark at 2 m and 8 m (identifying the central  6 m which will be timed).  The total time taken to ambulate 6 meters (m) is recorded to the nearest hundredth of a second. 6 m is then divided by the total time (in seconds) taken to ambulate and recorded in m/s.    Gait type Trial 1 time (seconds) Trial 2 time (seconds) Avg time (seconds) Gait speed   (m/s) Notes   (AD, assist, etc)   Comfortable speed 8.69 8.91 8.8 1.13    Fast/Maximal speed 6.58 6.73 6.66 1.5      Interpretation:   []  Less than 0.4 m/s: Household ambulator  []  0.4 to 0.8 m/s:  Limited community ambulator  []  0.8 to 1.2 m/s:  [employer]   []  >1.2 m/s:  Able to safely cross streets, extremely fit, climb many stairs.  []  < 0.7 m/s:  Increased risk of death, hospitalization, and falls  []  <1.0 m/s: May indicate increased fall risk  []  0.7 to 1.0 m/s:  Cognitive decline in five years  []  >0.9 m/s:  Able to do household activities  [] >1.0 m/s:  Carry groceries and light yard work  []  >1.0 m/s:  Less likely to be hospitalized or have adverse event  Gustav RAMAN, Lusardi M. White paper: "walking speed: The sixth vital sign." J Geriatr Phys Ther. 2009.     Patient []  is [x]  is not consistent with age/gender norms:  1.33 m/s:  average comfortable speed for males, 70-79 yo  2.07 m/s: average maximum gait speed for males, 37-79 yo  [place] RW. Comfortable and maximum walking speed of adults aged 20-79 years: Reference  values and  determinants. Age Ageing. 67.       Heart Rate Max Calculation:  Age:  78 yo  Beta-Blockers:  []  yes [x]  no  % of HR max HR (bpm)   65% 100   75% 116   85% 131   HR max 154   Polar App HR max 144        INTERVENTION:  Treatment Performed:  THERAPEUTIC ACTIVITY  Justified to address the following:  Dynamic activities to improve functional performance  RCB 10 min with review of importance of HEP     PT reviewed HEP as listed below. Discussed importance of established routine and consistency for improved effectiveness    Patient Education:   Reviewed HEP listed below and  educated on walking program  Patient educated on graded walking program to promote cardiovascular endurance, increased LE strength, and improve WB tolerance. Patient given written instructions   - week 1: 5 min  - week  2: 10 min  - week 3 3: 15 min  - Day 4: 20 min  Cont to progress until desired amount is achieved  *flat ground  *decrease by 5 min if pain or fatigue is sig or increases     Patient was educated on goals and benefits of therapy, as well as  on s/s of cardiac event. Education on current BP findings and concerns for BP findings .    Pt. did demonstrate an understanding of this education.  Home Exercise Program was not addressed today..    Access Code: [email or web address]  URL: [email or web address]  Date: 06/14/2024  Prepared by: Rosealee Ades    Program Notes  WALKING PROGRAMstart with walking 5 min every day. After one week increase time by 5 min. Continue to progress until desired time is achieved. If you have pain decrease time by 5 min. See RPE scale try to maintain RPE between 12-15.Always do a WARM UP and COOL DOWN    Exercises  - Sit to Stand with Arms Crossed  - 1 x daily - 7 x weekly - 3 sets - 10 reps  - Standing Shoulder Horizontal Abduction with Resistance  - 1 x daily - 7 x weekly - 3 sets - 15 reps  - Standing Hip Abduction with Counter Support  - 1 x daily - 7 x weekly - 3 sets - 10 reps    PHYSICAL THERAPY EVALUATION  Response to treatment: Delyle Weider is a 78 y.o. male and has been seen for 5 total PT visits from  05/24/24  To 06/14/2024. Pt reporting to PT discussing want for self [place] from PT due to limited time available to commit to exercise. Pt did demonstrate minor improvement in gait speed and 5x STS. He did have reduction in ABC score however pt verbally down about current physical well being and limited ability to complete exercise. PT recommending cont use of HEP and walking program to progress endurance and balance. Encouraged to return to PT if further  treatment is needed after consistency is established. All questions were answered and patient was given PT contact information for further questions after discharge. Pt is ready to [place] from formal PT services.     Impairments and functional limitations:  Patient is unable to participate in His role(s) as a(n) community member.  Activity limitations include inability to walk community distances  with increasing challenge to bathing, dressing and completing household chores without sig endurance deficit and increased balance deficit.  These activity limitations are due  to impairments in body structure and function that include impaired balance, impaired gait, motion intolerance, and Impaired postural reactions.  Patient requires skilled outpatient physical therapy to address these impairments and return to His desired activities and roles.     PLAN:  Discharge from PT care     Therapist Signature:      Rosealee Ades PT, DPT, CDNS  Doctor of Physical Therapy  [employer] Certified Dry Needling Specialist   06/14/2024    Note:  Please pardon any potential grammatical errors or typos as aspects of this note may have been created through speech-to-text software.           [1] No Known Allergies

## 2024-06-16 ENCOUNTER — Ambulatory Visit

## 2024-06-21 ENCOUNTER — Ambulatory Visit: Admitting: Physical Therapist

## 2024-06-23 ENCOUNTER — Ambulatory Visit

## 2024-06-30 ENCOUNTER — Ambulatory Visit

## 2024-07-05 ENCOUNTER — Ambulatory Visit: Admitting: Physical Therapist

## 2024-07-07 ENCOUNTER — Ambulatory Visit

## 2024-07-12 ENCOUNTER — Ambulatory Visit: Admitting: Physical Therapist

## 2024-07-14 ENCOUNTER — Ambulatory Visit

## 2024-07-19 ENCOUNTER — Ambulatory Visit: Admitting: Physical Therapist

## 2024-07-21 ENCOUNTER — Ambulatory Visit

## 2024-07-26 ENCOUNTER — Ambulatory Visit: Admitting: Physical Therapist

## 2024-07-28 ENCOUNTER — Ambulatory Visit
